# Patient Record
Sex: Male | Born: 1965 | Race: White | Hispanic: No | Marital: Married | Smoking: Former smoker
Health system: Southern US, Community
[De-identification: ages and names within clinical notes are randomized; demographics above are authoritative.]

## PROBLEM LIST (undated history)

## (undated) DIAGNOSIS — I77819 Aortic ectasia, unspecified site: Secondary | ICD-10-CM

## (undated) DIAGNOSIS — I10 Essential (primary) hypertension: Secondary | ICD-10-CM

## (undated) DIAGNOSIS — I701 Atherosclerosis of renal artery: Secondary | ICD-10-CM

## (undated) DIAGNOSIS — F32A Depression, unspecified: Secondary | ICD-10-CM

## (undated) DIAGNOSIS — J45909 Unspecified asthma, uncomplicated: Secondary | ICD-10-CM

## (undated) DIAGNOSIS — G4733 Obstructive sleep apnea (adult) (pediatric): Secondary | ICD-10-CM

## (undated) DIAGNOSIS — N200 Calculus of kidney: Secondary | ICD-10-CM

## (undated) DIAGNOSIS — I77810 Thoracic aortic ectasia: Secondary | ICD-10-CM

## (undated) DIAGNOSIS — F329 Major depressive disorder, single episode, unspecified: Secondary | ICD-10-CM

## (undated) DIAGNOSIS — F40240 Claustrophobia: Secondary | ICD-10-CM

## (undated) HISTORY — PX: TONSILLECTOMY: SUR1361

## (undated) HISTORY — PX: LASIK: SHX215

## (undated) HISTORY — DX: Atherosclerosis of renal artery: I70.1

## (undated) HISTORY — DX: Obstructive sleep apnea (adult) (pediatric): G47.33

## (undated) HISTORY — PX: ANKLE SURGERY: SHX546

## (undated) HISTORY — DX: Aortic ectasia, unspecified site: I77.819

## (undated) HISTORY — DX: Thoracic aortic ectasia: I77.810

---

## 2000-11-12 ENCOUNTER — Encounter: Payer: Self-pay | Admitting: Emergency Medicine

## 2000-11-12 ENCOUNTER — Emergency Department (HOSPITAL_COMMUNITY): Admission: EM | Admit: 2000-11-12 | Discharge: 2000-11-12 | Payer: Self-pay | Admitting: Emergency Medicine

## 2000-11-29 ENCOUNTER — Encounter: Payer: Self-pay | Admitting: Pediatrics

## 2000-11-29 ENCOUNTER — Encounter: Admission: RE | Admit: 2000-11-29 | Discharge: 2000-11-29 | Payer: Self-pay | Admitting: Pediatrics

## 2004-08-08 ENCOUNTER — Encounter: Admission: RE | Admit: 2004-08-08 | Discharge: 2004-08-08 | Payer: Self-pay | Admitting: Allergy and Immunology

## 2005-10-24 ENCOUNTER — Emergency Department (HOSPITAL_COMMUNITY): Admission: EM | Admit: 2005-10-24 | Discharge: 2005-10-25 | Payer: Self-pay | Admitting: Emergency Medicine

## 2005-12-07 ENCOUNTER — Emergency Department (HOSPITAL_COMMUNITY): Admission: EM | Admit: 2005-12-07 | Discharge: 2005-12-07 | Payer: Self-pay | Admitting: Emergency Medicine

## 2012-08-15 ENCOUNTER — Ambulatory Visit: Payer: Self-pay

## 2012-08-15 ENCOUNTER — Other Ambulatory Visit: Payer: Self-pay | Admitting: Occupational Medicine

## 2012-08-15 DIAGNOSIS — R52 Pain, unspecified: Secondary | ICD-10-CM

## 2013-02-07 ENCOUNTER — Other Ambulatory Visit: Payer: Self-pay | Admitting: Neurosurgery

## 2013-02-16 ENCOUNTER — Encounter (HOSPITAL_COMMUNITY): Payer: Self-pay | Admitting: Pharmacy Technician

## 2013-02-23 ENCOUNTER — Encounter (HOSPITAL_COMMUNITY): Payer: Self-pay

## 2013-02-23 ENCOUNTER — Encounter (HOSPITAL_COMMUNITY)
Admission: RE | Admit: 2013-02-23 | Discharge: 2013-02-23 | Disposition: A | Discharge status: Home / Self Care | Payer: Worker's Compensation | Source: Ambulatory Visit | Attending: Neurosurgery | Admitting: Neurosurgery

## 2013-02-23 ENCOUNTER — Ambulatory Visit (HOSPITAL_COMMUNITY)
Admission: RE | Admit: 2013-02-23 | Discharge: 2013-02-23 | Disposition: A | Discharge status: Home / Self Care | Payer: Worker's Compensation | Source: Ambulatory Visit | Attending: Neurosurgery | Admitting: Neurosurgery

## 2013-02-23 DIAGNOSIS — J984 Other disorders of lung: Secondary | ICD-10-CM | POA: Insufficient documentation

## 2013-02-23 DIAGNOSIS — I10 Essential (primary) hypertension: Secondary | ICD-10-CM | POA: Insufficient documentation

## 2013-02-23 DIAGNOSIS — Z01818 Encounter for other preprocedural examination: Secondary | ICD-10-CM | POA: Insufficient documentation

## 2013-02-23 HISTORY — DX: Calculus of kidney: N20.0

## 2013-02-23 HISTORY — DX: Essential (primary) hypertension: I10

## 2013-02-23 HISTORY — DX: Unspecified asthma, uncomplicated: J45.909

## 2013-02-23 LAB — BASIC METABOLIC PANEL
Calcium: 9.3 mg/dL (ref 8.4–10.5)
GFR calc Af Amer: >90 mL/min (ref >90)
GFR calc non Af Amer: 89 mL/min — ABNORMAL LOW (ref >90)
Glucose, Bld: 99 mg/dL (ref 70–99)
Sodium: 138 mEq/L (ref 135–145)

## 2013-02-23 LAB — SURGICAL PCR SCREEN
MRSA, PCR: NEGATIVE
Staphylococcus aureus: NEGATIVE

## 2013-02-23 LAB — CBC
MCH: 28.8 pg (ref 26.0–34.0)
MCHC: 34.7 g/dL (ref 30.0–36.0)
Platelets: 250 10*3/uL (ref 150–400)
RDW: 13.4 % (ref 11.5–15.5)

## 2013-02-23 NOTE — Pre-Procedure Instructions (Signed)
Don [name]  02/23/2013   Your procedure is scheduled on:    Tuesday  02/28/13  Report to [facility] at 730 AM.  Call this number if you have problems the morning of surgery: [phone]   Remember:   Do not eat food or drink liquids after midnight.   Take these medicines the morning of surgery with A SIP OF WATER:  ALBUTEROL, AMLODIPINE(NORVASC), ADVAIR, PAIN PILL (HYDROCODONE OR ULTRAM)   Do not wear jewelry, make-up or nail polish.  Do not wear lotions, powders, or perfumes. You may wear deodorant.  Do not shave 48 hours prior to surgery. Men may shave face and neck.  Do not bring valuables to the hospital.  [facility] is not responsible                   for any belongings or valuables.  Contacts, dentures or bridgework may not be worn into surgery.  Leave suitcase in the car. After surgery it may be brought to your room.  For patients admitted to the hospital, checkout time is 11:00 AM the day of  discharge.   Patients discharged the day of surgery will not be allowed to drive  home.  Name and phone number of your driver:   Special Instructions: Shower using CHG 2 nights before surgery and the night before surgery.  If you shower the day of surgery use CHG.  Use special wash - you have one bottle of CHG for all showers.  You should use approximately 1/3 of the bottle for each shower.   Please read over the following fact sheets that you were given: Pain Booklet, Coughing and Deep Breathing, MRSA Information and Surgical Site Infection Prevention

## 2013-02-23 NOTE — Progress Notes (Signed)
02/23/13 1530  OBSTRUCTIVE SLEEP APNEA  Have you ever been diagnosed with sleep apnea through a sleep study? No  Do you snore loudly (loud enough to be heard through closed doors)?  1  Do you often feel tired, fatigued, or sleepy during the daytime? 0  Has anyone observed you stop breathing during your sleep? 0  Do you have, or are you being treated for high blood pressure? 1  BMI more than 35 kg/m2? 1  Age over 46 years old? 0  Neck circumference greater than 40 cm/18 inches? 1  Gender: 1  Obstructive Sleep Apnea Score 5  Score 4 or greater  Results sent to PCP

## 2013-02-23 NOTE — Progress Notes (Signed)
PER SUSAN AT DR. STERN'S OFFICE ,PATIENT SHOULD ARRIVE AT 730 AM .

## 2013-02-27 MED ORDER — DEXTROSE 5 % IV SOLN
3.0000 g | INTRAVENOUS | Status: AC
  Administered 2013-02-28: 3 g via INTRAVENOUS
  Filled 2013-02-27: qty 3000

## 2013-02-27 NOTE — H&P (Signed)
NEUROSURGICAL CONSULTATION   CHIEF COMPLAINT:                                          Low back and right leg pain.   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  He describes right hip and leg pain.  In general, he states the pain is about 7-8/10, at its worst it is about 10/10.  He notes numbness and tingling in his right leg and foot, weakness in his right leg.  He describes that this was initially the result of an injury  while he was raking and using a leaf machine on 08/11/2012.  He said he felt a pull in his back and developed pain in his leg and went to the doctor a couple of days later because it was not getting any better.  He said he did some physical therapy until the first week of January and actually got better and he returned to work.  Then on 12/29/2012, while using a chain-saw and backhoe,  he developed recurrence of pain and he has been out of work since April.  He has been seen by Dr. Alto Denver, who advised him to get an MRI of his lumbar spine.    His lumbar MRI demonstrates that he has a herniated disc at L4-L5 on the right which is causing right L5 nerve root compression.     I have reviewed lumbar MRI, which shows that he has a significant disc herniation at L4-L5 on the right.  There is a small disc protrusion at L4-L5 on the left which does not appear to be causing any nerve root compression.  I also reviewed plain radiographs of the lumbar spine, which do not demonstrate significant spondylolisthesis in the lumbar spine.  AP view demonstrates five low-lying lumbar vertebrae.   His MRI scan was performed by [facility] on 01/05/2013, which demonstrates a focal right posterolateral disc protrusion at L4-L5 compressing the right lateral aspect of the thecal sac and right L5 nerve root.  Incidental note was made of a large left renal cyst  which was incompletely imaged, but per radiologist did not appear to be suggestive of pathologic lesion.     REVIEW OF SYSTEMS:                                    Detailed review of systems sheet was reviewed with the patient.  Under cardiovascular he notes high blood pressure.  Under respiratory he notes asthma.  Under musculoskeletal, he notes leg weakness, back pain, leg pain.    PAST MEDICAL HISTORY:                                           Current Medical Conditions:   He has history of hypertension and asthma, fracture of his right leg and ankle in 03/1986.            Medications and Allergies:  He has been taking Flexeril 10 mg  q.h.s., tramadol 50 mg two p.o. q.h.s. and p.r.n. bedtime, and diclofenac 75 mg twice daily for pain. His current medications include hydrochlorothiazide 25 mg per day, amlodipine 25 mg daily, Advair 100/50 mcg two puffs per day, cyclobenzaprine 10 mg as needed.    SOCIAL HISTORY:                                            He denies tobacco or drug use.  He is a social drinker of alcoholic beverages.   PHYSICAL EXAMINATION:                                He is currently 5 feet 9 inches tall, 298 pounds with a BMI of 43.  On examination today, Mr. President is pleasant cooperative man in no acute distress.  Blood pressure is 160/90, heart rate 76 and regular.  Respiratory rate 16.            HEENT - Normocephalic, atraumatic.            Respiratory - Chest clear to auscultation without rales or rhonchi.            Cardiovascular - Heart regular rate and rhythm without murmur.            Abdomen - Soft, nontender.  Active bowel sounds.  No hepatosplenomegaly appreciated.             Extremities - Without edema or cyanosis.          Musculoskeletal Examination -He cannot stand on his right foot.  He walks with an antalgic gait favoring his right lower extremity.   He has positive right sciatic notch discomfort to palpation.  He is able to bend to knees with  his upper extremities outstretched.  He is able to stand on his heels and toes.  He has a positive straight leg raise at 30 degrees on the right.   NEUROLOGICAL EXAMINATION:           The patient is awake, alert, fully oriented.  Speaks with clear and fluent speech.  Has intact short and long term memory.                Cranial Nerve Examination - Pupils are equal, round, and reactive to light.  Extraocular movements are intact.  Facial sensation and facial motor intact and symmetric.  Hearing is intact to finger rub.  Palate is upgoing.  Shoulder shrug is symmetric.  Tongue protrudes in the midline.            Motor Examination - Upper extremity strength is full in all motor groups, bilaterally symmetric.   Lower extremity strength is full in all motor groups, bilaterally symmetric with exception of right extensor hallucis longus of 4-/5 and right hip abductor 4/5.             Sensory Examination - Sensory examination is normal to pinprick in his lower extremities.             Deep Tendon Reflexes - Deep tendon reflexes are 2 in the biceps, triceps, and brachioradialis, 2 in the knees, 2 in the ankles.  The great toes are downgoing to plantar stimulation.    Cerebellar Examination - Normal.    IMPRESSION/RECOMMENDATONS:     Don [name] is a  47 year old man with a focal disc herniation at L4-L5 on the right.  He has a right L5 radiculopathy.  He has weakness, positive straight leg raise, significant pain, unable to get relief, and has been out of work because of this injury.   At this point I would recommend to Mr. Rumery that he undergo lumbar microdiscectomy at the L4-L5 level on the right to remove the fragment of herniated disc at the L4-L5 level on the right.  He wishes to proceed.  We will need to get clearance from Worker's Comp to proceed.  Risks and benefits were discussed in detail with the patient.  He says he wants to continue with his current pain medication, but I will give him  prescription for hydrocodone for more severe pain.  We will await authorization before proceeding with surgery.  We will then plan on right L4-L5 microdiscectomy.       __________________________________ Danae Orleans Venetia Maxon, MD

## 2013-02-28 ENCOUNTER — Ambulatory Visit (HOSPITAL_COMMUNITY)
Admission: RE | Admit: 2013-02-28 | Discharge: 2013-03-01 | Disposition: A | Discharge status: Home / Self Care | Payer: Worker's Compensation | Source: Ambulatory Visit | Attending: Neurosurgery | Admitting: Neurosurgery

## 2013-02-28 ENCOUNTER — Ambulatory Visit (HOSPITAL_COMMUNITY): Payer: Worker's Compensation

## 2013-02-28 ENCOUNTER — Encounter (HOSPITAL_COMMUNITY): Payer: Self-pay | Admitting: *Deleted

## 2013-02-28 ENCOUNTER — Encounter (HOSPITAL_COMMUNITY): Payer: Self-pay | Admitting: Anesthesiology

## 2013-02-28 ENCOUNTER — Encounter (HOSPITAL_COMMUNITY)
Admission: RE | Disposition: A | Discharge status: Home / Self Care | Payer: Self-pay | Source: Ambulatory Visit | Attending: Neurosurgery

## 2013-02-28 ENCOUNTER — Ambulatory Visit (HOSPITAL_COMMUNITY): Payer: Worker's Compensation | Admitting: Anesthesiology

## 2013-02-28 DIAGNOSIS — Z79899 Other long term (current) drug therapy: Secondary | ICD-10-CM | POA: Insufficient documentation

## 2013-02-28 DIAGNOSIS — M47817 Spondylosis without myelopathy or radiculopathy, lumbosacral region: Secondary | ICD-10-CM | POA: Insufficient documentation

## 2013-02-28 DIAGNOSIS — M5126 Other intervertebral disc displacement, lumbar region: Secondary | ICD-10-CM | POA: Insufficient documentation

## 2013-02-28 DIAGNOSIS — I10 Essential (primary) hypertension: Secondary | ICD-10-CM | POA: Insufficient documentation

## 2013-02-28 HISTORY — PX: LUMBAR LAMINECTOMY/DECOMPRESSION MICRODISCECTOMY: SHX5026

## 2013-02-28 SURGERY — LUMBAR LAMINECTOMY/DECOMPRESSION MICRODISCECTOMY 1 LEVEL
Anesthesia: General | Laterality: Right | Wound class: Clean

## 2013-02-28 MED ORDER — LIDOCAINE HCL 4 % MT SOLN
OROMUCOSAL | Status: DC | PRN
  Administered 2013-02-28: 4 mL via TOPICAL

## 2013-02-28 MED ORDER — DOCUSATE SODIUM 100 MG PO CAPS
100.0000 mg | ORAL_CAPSULE | Freq: Two times a day (BID) | ORAL | Status: DC
  Administered 2013-02-28 (×2): 100 mg via ORAL
  Filled 2013-02-28 (×2): qty 1

## 2013-02-28 MED ORDER — MOMETASONE FURO-FORMOTEROL FUM 100-5 MCG/ACT IN AERO
2.0000 | INHALATION_SPRAY | Freq: Two times a day (BID) | RESPIRATORY_TRACT | Status: DC
  Administered 2013-02-28: 2 via RESPIRATORY_TRACT
  Filled 2013-02-28 (×2): qty 8.8

## 2013-02-28 MED ORDER — 0.9 % SODIUM CHLORIDE (POUR BTL) OPTIME
TOPICAL | Status: DC | PRN
  Administered 2013-02-28: 1000 mL

## 2013-02-28 MED ORDER — OXYCODONE HCL 5 MG/5ML PO SOLN: 5.0000 mg | Freq: Once | ORAL | Status: DC | PRN

## 2013-02-28 MED ORDER — ARTIFICIAL TEARS OP OINT
TOPICAL_OINTMENT | OPHTHALMIC | Status: DC | PRN
  Administered 2013-02-28: 1 via OPHTHALMIC

## 2013-02-28 MED ORDER — BISACODYL 10 MG RE SUPP: 10.0000 mg | Freq: Every day | RECTAL | Status: DC | PRN

## 2013-02-28 MED ORDER — FENTANYL CITRATE 0.05 MG/ML IJ SOLN
INTRAMUSCULAR | Status: DC | PRN
  Administered 2013-02-28 (×2): 100 ug via INTRAVENOUS

## 2013-02-28 MED ORDER — NEOSTIGMINE METHYLSULFATE 1 MG/ML IJ SOLN
INTRAMUSCULAR | Status: DC | PRN
  Administered 2013-02-28: 3 mg via INTRAVENOUS

## 2013-02-28 MED ORDER — ALBUTEROL SULFATE HFA 108 (90 BASE) MCG/ACT IN AERS
2.0000 | INHALATION_SPRAY | Freq: Four times a day (QID) | RESPIRATORY_TRACT | Status: DC | PRN
  Filled 2013-02-28: qty 6.7

## 2013-02-28 MED ORDER — MIDAZOLAM HCL 5 MG/5ML IJ SOLN
INTRAMUSCULAR | Status: DC | PRN
  Administered 2013-02-28: 2 mg via INTRAVENOUS

## 2013-02-28 MED ORDER — FENTANYL CITRATE 0.05 MG/ML IJ SOLN
INTRAMUSCULAR | Status: DC | PRN
  Administered 2013-02-28: 100 ug via INTRAVENOUS

## 2013-02-28 MED ORDER — ACETAMINOPHEN 650 MG RE SUPP: 650.0000 mg | RECTAL | Status: DC | PRN

## 2013-02-28 MED ORDER — GLYCOPYRROLATE 0.2 MG/ML IJ SOLN
INTRAMUSCULAR | Status: DC | PRN
  Administered 2013-02-28: 0.4 mg via INTRAVENOUS

## 2013-02-28 MED ORDER — ONDANSETRON HCL 4 MG/2ML IJ SOLN: 4.0000 mg | Freq: Four times a day (QID) | INTRAMUSCULAR | Status: DC | PRN

## 2013-02-28 MED ORDER — MIDAZOLAM HCL 2 MG/2ML IJ SOLN: 1.0000 mg | INTRAMUSCULAR | Status: DC | PRN

## 2013-02-28 MED ORDER — MIDAZOLAM HCL 2 MG/2ML IJ SOLN
INTRAMUSCULAR | Status: AC
  Filled 2013-02-28: qty 2

## 2013-02-28 MED ORDER — PHENOL 1.4 % MT LIQD: 1.0000 | OROMUCOSAL | Status: DC | PRN

## 2013-02-28 MED ORDER — ALUM & MAG HYDROXIDE-SIMETH 200-200-20 MG/5ML PO SUSP: 30.0000 mL | Freq: Four times a day (QID) | ORAL | Status: DC | PRN

## 2013-02-28 MED ORDER — HYDROCHLOROTHIAZIDE 25 MG PO TABS
25.0000 mg | ORAL_TABLET | Freq: Every day | ORAL | Status: DC
  Administered 2013-02-28: 25 mg via ORAL
  Filled 2013-02-28 (×2): qty 1

## 2013-02-28 MED ORDER — ACETAMINOPHEN 325 MG PO TABS: 650.0000 mg | ORAL_TABLET | ORAL | Status: DC | PRN

## 2013-02-28 MED ORDER — FENTANYL CITRATE 0.05 MG/ML IJ SOLN
INTRAMUSCULAR | Status: AC
  Filled 2013-02-28: qty 2

## 2013-02-28 MED ORDER — DICLOFENAC SODIUM 75 MG PO TBEC
75.0000 mg | DELAYED_RELEASE_TABLET | Freq: Two times a day (BID) | ORAL | Status: DC
  Administered 2013-02-28 (×2): 75 mg via ORAL
  Filled 2013-02-28 (×4): qty 1

## 2013-02-28 MED ORDER — POLYETHYLENE GLYCOL 3350 17 G PO PACK
17.0000 g | PACK | Freq: Every day | ORAL | Status: DC | PRN
  Filled 2013-02-28: qty 1

## 2013-02-28 MED ORDER — TRAMADOL HCL 50 MG PO TABS
50.0000 mg | ORAL_TABLET | Freq: Four times a day (QID) | ORAL | Status: DC | PRN
  Filled 2013-02-28: qty 1

## 2013-02-28 MED ORDER — ZOLPIDEM TARTRATE 5 MG PO TABS: 5.0000 mg | ORAL_TABLET | Freq: Every evening | ORAL | Status: DC | PRN

## 2013-02-28 MED ORDER — SODIUM CHLORIDE 0.9 % IJ SOLN: 3.0000 mL | INTRAMUSCULAR | Status: DC | PRN

## 2013-02-28 MED ORDER — ROCURONIUM BROMIDE 100 MG/10ML IV SOLN
INTRAVENOUS | Status: DC | PRN
  Administered 2013-02-28: 50 mg via INTRAVENOUS

## 2013-02-28 MED ORDER — CEFAZOLIN SODIUM 1-5 GM-% IV SOLN
1.0000 g | Freq: Three times a day (TID) | INTRAVENOUS | Status: AC
  Administered 2013-02-28 (×2): 1 g via INTRAVENOUS
  Filled 2013-02-28 (×2): qty 50

## 2013-02-28 MED ORDER — MENTHOL 3 MG MT LOZG: 1.0000 | LOZENGE | OROMUCOSAL | Status: DC | PRN

## 2013-02-28 MED ORDER — METHYLPREDNISOLONE ACETATE 80 MG/ML IJ SUSP
INTRAMUSCULAR | Status: DC | PRN
  Administered 2013-02-28: 80 mg

## 2013-02-28 MED ORDER — HEMOSTATIC AGENTS (NO CHARGE) OPTIME
TOPICAL | Status: DC | PRN
  Administered 2013-02-28: 1 via TOPICAL

## 2013-02-28 MED ORDER — PANTOPRAZOLE SODIUM 40 MG IV SOLR
40.0000 mg | Freq: Every day | INTRAVENOUS | Status: DC
  Administered 2013-02-28: 40 mg via INTRAVENOUS
  Filled 2013-02-28 (×2): qty 40

## 2013-02-28 MED ORDER — CYCLOBENZAPRINE HCL 10 MG PO TABS
10.0000 mg | ORAL_TABLET | Freq: Three times a day (TID) | ORAL | Status: DC | PRN
  Administered 2013-03-01: 10 mg via ORAL
  Filled 2013-02-28: qty 1

## 2013-02-28 MED ORDER — SENNA 8.6 MG PO TABS
1.0000 | ORAL_TABLET | Freq: Two times a day (BID) | ORAL | Status: DC
  Administered 2013-02-28 (×2): 8.6 mg via ORAL
  Filled 2013-02-28 (×4): qty 1

## 2013-02-28 MED ORDER — PROPOFOL 10 MG/ML IV BOLUS
INTRAVENOUS | Status: DC | PRN
  Administered 2013-02-28: 200 mg via INTRAVENOUS

## 2013-02-28 MED ORDER — OXYCODONE-ACETAMINOPHEN 5-325 MG PO TABS
1.0000 | ORAL_TABLET | ORAL | Status: DC | PRN
  Administered 2013-02-28 – 2013-03-01 (×2): 2 via ORAL
  Filled 2013-02-28 (×2): qty 2

## 2013-02-28 MED ORDER — SODIUM CHLORIDE 0.9 % IJ SOLN
3.0000 mL | Freq: Two times a day (BID) | INTRAMUSCULAR | Status: DC
  Administered 2013-02-28: 3 mL via INTRAVENOUS

## 2013-02-28 MED ORDER — OXYCODONE HCL 5 MG PO TABS: 5.0000 mg | ORAL_TABLET | Freq: Once | ORAL | Status: DC | PRN

## 2013-02-28 MED ORDER — AMLODIPINE BESYLATE 2.5 MG PO TABS
2.5000 mg | ORAL_TABLET | Freq: Every day | ORAL | Status: DC
Start: 2013-02-28 — End: 2013-03-01
  Filled 2013-02-28 (×2): qty 1

## 2013-02-28 MED ORDER — FLEET ENEMA 7-19 GM/118ML RE ENEM
1.0000 | ENEMA | Freq: Once | RECTAL | Status: AC | PRN
  Filled 2013-02-28: qty 1

## 2013-02-28 MED ORDER — LIDOCAINE HCL (CARDIAC) 20 MG/ML IV SOLN
INTRAVENOUS | Status: DC | PRN
  Administered 2013-02-28: 80 mg via INTRAVENOUS

## 2013-02-28 MED ORDER — LABETALOL HCL 5 MG/ML IV SOLN
INTRAVENOUS | Status: DC | PRN
  Administered 2013-02-28 (×2): 5 mg via INTRAVENOUS

## 2013-02-28 MED ORDER — MORPHINE SULFATE 2 MG/ML IJ SOLN: 1.0000 mg | INTRAMUSCULAR | Status: DC | PRN

## 2013-02-28 MED ORDER — HYDROCODONE-ACETAMINOPHEN 5-325 MG PO TABS
1.0000 | ORAL_TABLET | ORAL | Status: DC | PRN
Start: 2013-02-28 — End: 2013-03-01

## 2013-02-28 MED ORDER — LACTATED RINGERS IV SOLN
INTRAVENOUS | Status: DC | PRN
  Administered 2013-02-28 (×2): via INTRAVENOUS

## 2013-02-28 MED ORDER — HYDROMORPHONE HCL PF 1 MG/ML IJ SOLN
0.2500 mg | INTRAMUSCULAR | Status: DC | PRN
  Administered 2013-02-28 (×2): 0.5 mg via INTRAVENOUS

## 2013-02-28 MED ORDER — KCL IN DEXTROSE-NACL 20-5-0.45 MEQ/L-%-% IV SOLN
INTRAVENOUS | Status: DC
  Filled 2013-02-28 (×4): qty 1000

## 2013-02-28 MED ORDER — THROMBIN 5000 UNITS EX SOLR
CUTANEOUS | Status: DC | PRN
  Administered 2013-02-28 (×2): 5000 [IU] via TOPICAL

## 2013-02-28 MED ORDER — HYDROMORPHONE HCL PF 1 MG/ML IJ SOLN
INTRAMUSCULAR | Status: AC
  Filled 2013-02-28: qty 1

## 2013-02-28 MED ORDER — BUPIVACAINE HCL (PF) 0.5 % IJ SOLN
INTRAMUSCULAR | Status: DC | PRN
  Administered 2013-02-28: 5 mL

## 2013-02-28 MED ORDER — ONDANSETRON HCL 4 MG/2ML IJ SOLN: 4.0000 mg | INTRAMUSCULAR | Status: DC | PRN

## 2013-02-28 MED ORDER — LIDOCAINE-EPINEPHRINE 1 %-1:100000 IJ SOLN
INTRAMUSCULAR | Status: DC | PRN
  Administered 2013-02-28: 5 mL via INTRADERMAL

## 2013-02-28 MED ORDER — SODIUM CHLORIDE 0.9 % IV SOLN: 250.0000 mL | INTRAVENOUS | Status: DC

## 2013-02-28 MED ORDER — DEXTROSE 5 % IV SOLN
10.0000 mg | INTRAVENOUS | Status: DC | PRN
  Administered 2013-02-28: 50 ug/min via INTRAVENOUS

## 2013-02-28 MED ORDER — ONDANSETRON HCL 4 MG/2ML IJ SOLN
INTRAMUSCULAR | Status: DC | PRN
  Administered 2013-02-28: 4 mg via INTRAVENOUS

## 2013-02-28 MED ORDER — DIAZEPAM 5 MG PO TABS: 5.0000 mg | ORAL_TABLET | Freq: Four times a day (QID) | ORAL | Status: DC | PRN

## 2013-02-28 MED ORDER — HYDROCODONE-ACETAMINOPHEN 5-325 MG PO TABS: 1.0000 | ORAL_TABLET | Freq: Four times a day (QID) | ORAL | Status: DC | PRN

## 2013-02-28 SURGICAL SUPPLY — 58 items
ADH SKN CLS APL DERMABOND .7 (GAUZE/BANDAGES/DRESSINGS) ×1
APL SKNCLS STERI-STRIP NONHPOA (GAUZE/BANDAGES/DRESSINGS)
BAG DECANTER FOR FLEXI CONT (MISCELLANEOUS) ×2 IMPLANT
BENZOIN TINCTURE PRP APPL 2/3 (GAUZE/BANDAGES/DRESSINGS) IMPLANT
BIT DRILL NEURO 2X3.1 SFT TUCH (MISCELLANEOUS) ×1 IMPLANT
BLADE SURG ROTATE 9660 (MISCELLANEOUS) IMPLANT
BUR ROUND FLUTED 5 RND (BURR) ×2 IMPLANT
CANISTER SUCTION 2500CC (MISCELLANEOUS) ×2 IMPLANT
CLOTH BEACON ORANGE TIMEOUT ST (SAFETY) ×2 IMPLANT
CONT SPEC 4OZ CLIKSEAL STRL BL (MISCELLANEOUS) ×2 IMPLANT
DERMABOND ADVANCED (GAUZE/BANDAGES/DRESSINGS) ×1
DERMABOND ADVANCED .7 DNX12 (GAUZE/BANDAGES/DRESSINGS) ×1 IMPLANT
DRAPE LAPAROTOMY 100X72X124 (DRAPES) ×2 IMPLANT
DRAPE MICROSCOPE LEICA (MISCELLANEOUS) ×2 IMPLANT
DRAPE POUCH INSTRU U-SHP 10X18 (DRAPES) ×2 IMPLANT
DRAPE SURG 17X23 STRL (DRAPES) ×2 IMPLANT
DRESSING TELFA 8X3 (GAUZE/BANDAGES/DRESSINGS) IMPLANT
DRILL NEURO 2X3.1 SOFT TOUCH (MISCELLANEOUS) ×2
DURAPREP 26ML APPLICATOR (WOUND CARE) ×2 IMPLANT
ELECT REM PT RETURN 9FT ADLT (ELECTROSURGICAL) ×2
ELECTRODE REM PT RTRN 9FT ADLT (ELECTROSURGICAL) ×1 IMPLANT
GAUZE SPONGE 4X4 16PLY XRAY LF (GAUZE/BANDAGES/DRESSINGS) IMPLANT
GLOVE BIO SURGEON STRL SZ8 (GLOVE) ×2 IMPLANT
GLOVE BIOGEL PI IND STRL 8 (GLOVE) ×1 IMPLANT
GLOVE BIOGEL PI IND STRL 8.5 (GLOVE) ×1 IMPLANT
GLOVE BIOGEL PI INDICATOR 8 (GLOVE) ×1
GLOVE BIOGEL PI INDICATOR 8.5 (GLOVE) ×1
GLOVE ECLIPSE 7.5 STRL STRAW (GLOVE) ×2 IMPLANT
GLOVE ECLIPSE 8.0 STRL XLNG CF (GLOVE) ×2 IMPLANT
GLOVE EXAM NITRILE LRG STRL (GLOVE) IMPLANT
GLOVE EXAM NITRILE MD LF STRL (GLOVE) ×1 IMPLANT
GLOVE EXAM NITRILE XL STR (GLOVE) IMPLANT
GLOVE EXAM NITRILE XS STR PU (GLOVE) IMPLANT
GOWN BRE IMP SLV AUR LG STRL (GOWN DISPOSABLE) IMPLANT
GOWN BRE IMP SLV AUR XL STRL (GOWN DISPOSABLE) ×4 IMPLANT
GOWN STRL REIN 2XL LVL4 (GOWN DISPOSABLE) ×2 IMPLANT
KIT BASIN OR (CUSTOM PROCEDURE TRAY) ×2 IMPLANT
KIT ROOM TURNOVER OR (KITS) ×2 IMPLANT
NDL HYPO 18GX1.5 BLUNT FILL (NEEDLE) IMPLANT
NDL HYPO 25X1 1.5 SAFETY (NEEDLE) ×1 IMPLANT
NEEDLE HYPO 18GX1.5 BLUNT FILL (NEEDLE) ×2 IMPLANT
NEEDLE HYPO 25X1 1.5 SAFETY (NEEDLE) ×2 IMPLANT
NS IRRIG 1000ML POUR BTL (IV SOLUTION) ×2 IMPLANT
PACK LAMINECTOMY NEURO (CUSTOM PROCEDURE TRAY) ×2 IMPLANT
PAD ARMBOARD 7.5X6 YLW CONV (MISCELLANEOUS) ×6 IMPLANT
RUBBERBAND STERILE (MISCELLANEOUS) ×4 IMPLANT
SPONGE GAUZE 4X4 12PLY (GAUZE/BANDAGES/DRESSINGS) IMPLANT
SPONGE SURGIFOAM ABS GEL SZ50 (HEMOSTASIS) ×2 IMPLANT
STRIP CLOSURE SKIN 1/2X4 (GAUZE/BANDAGES/DRESSINGS) IMPLANT
SUT VIC AB 0 CT1 18XCR BRD8 (SUTURE) ×1 IMPLANT
SUT VIC AB 0 CT1 8-18 (SUTURE) ×2
SUT VIC AB 2-0 CT1 18 (SUTURE) ×2 IMPLANT
SUT VIC AB 3-0 SH 8-18 (SUTURE) ×2 IMPLANT
SYR 20ML ECCENTRIC (SYRINGE) ×2 IMPLANT
SYR 5ML LL (SYRINGE) ×1 IMPLANT
TOWEL OR 17X24 6PK STRL BLUE (TOWEL DISPOSABLE) ×2 IMPLANT
TOWEL OR 17X26 10 PK STRL BLUE (TOWEL DISPOSABLE) ×2 IMPLANT
WATER STERILE IRR 1000ML POUR (IV SOLUTION) ×2 IMPLANT

## 2013-02-28 NOTE — Transfer of Care (Signed)
Immediate Anesthesia Transfer of Care Note  Patient: Don [name]  Procedure(s) Performed: Procedure(s) with comments: LUMBAR LAMINECTOMY/DECOMPRESSION MICRODISCECTOMY 1 LEVEL (Right) - Right Lumbar Four-Five Laminectomy  Patient Location: PACU  Anesthesia Type:General  Level of Consciousness: awake, alert  and oriented  Airway & Oxygen Therapy: Patient Spontanous Breathing and Patient connected to nasal cannula oxygen  Post-op Assessment: Report given to PACU RN and Post -op Vital signs reviewed and stable  Post vital signs: Reviewed and stable  Complications: No apparent anesthesia complications

## 2013-02-28 NOTE — Anesthesia Procedure Notes (Signed)
Procedure Name: Intubation Date/Time: 02/28/2013 10:04 AM Performed by: Tyrone Nine Pre-anesthesia Checklist: Patient identified, Timeout performed, Emergency Drugs available, Suction available and Patient being monitored Patient Re-evaluated:Patient Re-evaluated prior to inductionOxygen Delivery Method: Circle system utilized Preoxygenation: Pre-oxygenation with 100% oxygen Intubation Type: IV induction Ventilation: Two handed mask ventilation required and Oral airway inserted - appropriate to patient size Laryngoscope Size: Mac and 3 Grade View: Grade I Tube size: 8.0 mm Number of attempts: 1 Airway Equipment and Method: Stylet Placement Confirmation: ETT inserted through vocal cords under direct vision,  positive ETCO2 and breath sounds checked- equal and bilateral Secured at: 23 cm Tube secured with: Tape Dental Injury: Teeth and Oropharynx as per pre-operative assessment

## 2013-02-28 NOTE — Progress Notes (Signed)
Subjective: Patient reports "I don't have any thigh or groin pain"  Objective: Vital signs in last 24 hours: Temp:  [97.3 F (36.3 C)-98.9 F (37.2 C)] 98.9 F (37.2 C) (06/03 1636) Pulse Rate:  [73-88] 88 (06/03 1636) Resp:  [11-25] 18 (06/03 1636) BP: (116-156)/(82-96) 142/89 mmHg (06/03 1636) SpO2:  [92 %-96 %] 92 % (06/03 1636)  Intake/Output from previous day:   Intake/Output this shift: Total I/O In: 1780 [P.O.:480; I.V.:1300] Out: 150 [Blood:150]  Alert, ambulating with wife & nurse. Mild lumbar pain controlled by po meds. Incision with Dermabond, no erythema, swelling, or drainage. Good strength BLE.  Lab Results: No results found for this basename: WBC, HGB, HCT, PLT,  in the last 72 hours BMET No results found for this basename: NA, K, CL, CO2, GLUCOSE, BUN, CREATININE, CALCIUM,  in the last 72 hours  Studies/Results: Dg Lumbar Spine 1 View  02/28/2013   *RADIOLOGY REPORT*  Clinical Data: Back pain  LUMBAR SPINE - 1 VIEW  Comparison: MRI 01/05/2013.  Findings: Cross-table lateral film #1 at 1035 hours demonstrates a probe at L4-L5.  IMPRESSION: L4-L5 marked.   Original Report Authenticated By: Davonna Belling, M.D.    Assessment/Plan: Improved   LOS: 0 days  Per Dr. Venetia Maxon, ok to d/c to home when comfortable from pain standpoint & ambulating without difficulty.  Pt has Hydrocodone & Flexeril at home for PRN use. Pt knows to call office to schedule 3-4 wk f/u appt. Pt verbalizes understanding of d/c instructions.   Georgiann Cocker 02/28/2013, 5:15 PM

## 2013-02-28 NOTE — Anesthesia Postprocedure Evaluation (Signed)
Anesthesia Post Note  Patient: Don [name]  Procedure(s) Performed: Procedure(s) (LRB): LUMBAR LAMINECTOMY/DECOMPRESSION MICRODISCECTOMY 1 LEVEL (Right)  Anesthesia type: General  Patient location: PACU  Post pain: Pain level controlled and Adequate analgesia  Post assessment: Post-op Vital signs reviewed, Patient's Cardiovascular Status Stable, Respiratory Function Stable, Patent Airway and Pain level controlled  Last Vitals:  Filed Vitals:   02/28/13 1236  BP:   Pulse: 73  Temp:   Resp: 11    Post vital signs: Reviewed and stable  Level of consciousness: awake, alert  and oriented  Complications: No apparent anesthesia complications

## 2013-02-28 NOTE — Interval H&P Note (Signed)
History and Physical Interval Note:  02/28/2013 6:24 AM  [name]  has presented today for surgery, with the diagnosis of Lumbar hnp with out myelopathy  The various methods of treatment have been discussed with the patient and family. After consideration of risks, benefits and other options for treatment, the patient has consented to  Procedure(s) with comments: LUMBAR LAMINECTOMY/DECOMPRESSION MICRODISCECTOMY 1 LEVEL (Right) - Right L4-5 Laminectomy as a surgical intervention .  The patient's history has been reviewed, patient examined, no change in status, stable for surgery.  I have reviewed the patient's chart and labs.  Questions were answered to the patient's satisfaction.     Kirandeep Fariss D

## 2013-02-28 NOTE — Preoperative (Signed)
Beta Blockers   Reason not to administer Beta Blockers:Not Applicable 

## 2013-02-28 NOTE — Op Note (Signed)
02/28/2013  1:26 PM  PATIENT:  Don [name]  47 y.o. 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, lumbar spondylosis, lumbar stenosis, lumbar radiculopathy L 45 Right  PROCEDURE:  Procedure(s) with comments: LUMBAR LAMINECTOMY/DECOMPRESSION MICRODISCECTOMY 1 LEVEL (Right) - Right Lumbar Four-Five Laminectomy with microdissection  SURGEON:  Surgeon(s) and Role:    * Maeola Harman, MD - Primary    * Reinaldo Meeker, MD - Assisting  PHYSICIAN ASSISTANT:   ASSISTANTS: Poteat, RN   ANESTHESIA:   general  EBL:  Total I/O In: 1300 [I.V.:1300] Out: 150 [Blood:150]  BLOOD ADMINISTERED:none  DRAINS: none   LOCAL MEDICATIONS USED:  MARCAINE     SPECIMEN:  No Specimen  DISPOSITION OF SPECIMEN:  N/A  COUNTS:  YES  TOURNIQUET:  * No tourniquets in log *  DICTATION: Patient has aL4-5 disc rupture on the right with significant right leg weakness. It was elected to take him to surgery for right L4-5 microdiscectomy.  Procedure: Patient was brought to the operating room and following the smooth and uncomplicated induction of general endotracheal anesthesia he was placed in a prone position on the Wilson frame. Low back was prepped and draped in the usual sterile fashion with DuraPrep. Area of planned incision was infiltrated with local lidocaine. Incision was made in the midline and carried to the lumbodorsal fascia which was incised on the right side of midline. Subperiosteal dissection was performed exposing what was felt to be L45 level. Intraoperative x-ray demonstrated marker probe at L4-5. A hemi-semi-laminectomy of L4 was performed a high-speed drill and completed with Kerrison rongeurs and a generous foraminotomy was performed overlying the superior aspect of the L5 lamina. Ligamentum flavum was  detached and removed in a piecemeal fashion and the L5 nerve root was decompressed laterally with removal of the superior aspect of the facet and ligamentum causing nerve root compression. The microscope was brought into the field and the L5 nerve root was mobilized medially. This exposed a large amount of both hard and soft disc material and multiple free fragment of herniated disc material. Multiple fragments were removed and these extended into the interspace which appeared to be quite soft with a disrupted annulus overlying the interspace. As a result it was elected to further decompress the interspace and remove loose disc material and this was done with a variety Epstein curettes and pituitary rongeurs. The redundant annulus was also removed with 2 mm Kerrison rongeur. There was evidence on the MRI of cephalad fragments and these were carefully removed and the L4 nerve root was also decompressed. At this point it was felt that all neural elements were well decompressed and there was no evidence of residual loose disc material within the interspace. The interspace was then irrigated with bacitracin saline and no additional disc material was mobilized. Hemostasis was assured with bipolar electrocautery and the interspace was irrigated with Depo-Medrol and fentanyl. The lumbodorsal fascia was closed with 0 Vicryl sutures the subcutaneous tissues reapproximated 2-0 Vicryl inverted sutures and the skin edges were reapproximated with 3-0 Vicryl subcuticular stitch. The wound is dressed with Dermabond. Patient was extubated in the operating room and taken to recovery in stable and satisfactory condition having tolerated his operation well counts were correct at the end of the case.  PLAN OF CARE: Admit for overnight observation  PATIENT DISPOSITION:  PACU - hemodynamically stable.  Delay start of Pharmacological VTE agent (>24hrs) due to surgical blood loss or risk of bleeding: yes

## 2013-02-28 NOTE — Brief Op Note (Signed)
02/28/2013  1:26 PM  PATIENT:  Don [name]  46 y.o. 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, lumbar spondylosis, lumbar stenosis, lumbar radiculopathy L 45 Right  PROCEDURE:  Procedure(s) with comments: LUMBAR LAMINECTOMY/DECOMPRESSION MICRODISCECTOMY 1 LEVEL (Right) - Right Lumbar Four-Five Laminectomy with microdissection  SURGEON:  Surgeon(s) and Role:    * Kimesha Claxton, MD - Primary    * Randy O Kritzer, MD - Assisting  PHYSICIAN ASSISTANT:   ASSISTANTS: Poteat, RN   ANESTHESIA:   general  EBL:  Total I/O In: 1300 [I.V.:1300] Out: 150 [Blood:150]  BLOOD ADMINISTERED:none  DRAINS: none   LOCAL MEDICATIONS USED:  MARCAINE     SPECIMEN:  No Specimen  DISPOSITION OF SPECIMEN:  N/A  COUNTS:  YES  TOURNIQUET:  * No tourniquets in log *  DICTATION: Patient has aL4-5 disc rupture on the right with significant right leg weakness. It was elected to take him to surgery for right L4-5 microdiscectomy.  Procedure: Patient was brought to the operating room and following the smooth and uncomplicated induction of general endotracheal anesthesia he was placed in a prone position on the Wilson frame. Low back was prepped and draped in the usual sterile fashion with DuraPrep. Area of planned incision was infiltrated with local lidocaine. Incision was made in the midline and carried to the lumbodorsal fascia which was incised on the right side of midline. Subperiosteal dissection was performed exposing what was felt to be L45 level. Intraoperative x-ray demonstrated marker probe at L4-5. A hemi-semi-laminectomy of L4 was performed a high-speed drill and completed with Kerrison rongeurs and a generous foraminotomy was performed overlying the superior aspect of the L5 lamina. Ligamentum flavum was  detached and removed in a piecemeal fashion and the L5 nerve root was decompressed laterally with removal of the superior aspect of the facet and ligamentum causing nerve root compression. The microscope was brought into the field and the L5 nerve root was mobilized medially. This exposed a large amount of both hard and soft disc material and multiple free fragment of herniated disc material. Multiple fragments were removed and these extended into the interspace which appeared to be quite soft with a disrupted annulus overlying the interspace. As a result it was elected to further decompress the interspace and remove loose disc material and this was done with a variety Epstein curettes and pituitary rongeurs. The redundant annulus was also removed with 2 mm Kerrison rongeur. There was evidence on the MRI of cephalad fragments and these were carefully removed and the L4 nerve root was also decompressed. At this point it was felt that all neural elements were well decompressed and there was no evidence of residual loose disc material within the interspace. The interspace was then irrigated with bacitracin saline and no additional disc material was mobilized. Hemostasis was assured with bipolar electrocautery and the interspace was irrigated with Depo-Medrol and fentanyl. The lumbodorsal fascia was closed with 0 Vicryl sutures the subcutaneous tissues reapproximated 2-0 Vicryl inverted sutures and the skin edges were reapproximated with 3-0 Vicryl subcuticular stitch. The wound is dressed with Dermabond. Patient was extubated in the operating room and taken to recovery in stable and satisfactory condition having tolerated his operation well counts were correct at the end of the case.  PLAN OF CARE: Admit for overnight observation  PATIENT DISPOSITION:  PACU - hemodynamically stable.     Delay start of Pharmacological VTE agent (>24hrs) due to surgical blood loss or risk of bleeding: yes  

## 2013-02-28 NOTE — Anesthesia Preprocedure Evaluation (Addendum)
Anesthesia Evaluation  Patient identified by MRN, date of birth, ID band Patient awake    Reviewed: Allergy & Precautions, H&P , NPO status , Patient's Chart, lab work & pertinent test results  Airway Mallampati: II  Neck ROM: full    Dental   Pulmonary asthma , former smoker,  02-23-13 Chest x-ray Findings: Scarring in the lung bases.  Heart is normal size.  No effusions.  No acute bony abnormality.   IMPRESSION: Bibasilar scarring.  No acute findings.             Cardiovascular hypertension, Pt. on medications   23-Feb-2013 16:05:44 [facility] ROUTINE RECORD Normal sinus rhythm Normal ECG   Neuro/Psych    GI/Hepatic negative GI ROS, Neg liver ROS,   Endo/Other  Morbid obesity  Renal/GU Hx Renal Calculus     Musculoskeletal  (+) Arthritis -, Osteoarthritis,    Abdominal   Peds  Hematology negative hematology ROS (+)   Anesthesia Other Findings   Reproductive/Obstetrics negative OB ROS                        Anesthesia Physical Anesthesia Plan  ASA: II  Anesthesia Plan: General   Post-op Pain Management:    Induction: Intravenous  Airway Management Planned: Oral ETT  Additional Equipment:   Intra-op Plan:   Post-operative Plan: Extubation in OR  Informed Consent: I have reviewed the patients History and Physical, chart, labs and discussed the procedure including the risks, benefits and alternatives for the proposed anesthesia with the patient or authorized representative who has indicated his/her understanding and acceptance.     Plan Discussed with: CRNA, Anesthesiologist and Surgeon  Anesthesia Plan Comments:         Anesthesia Quick Evaluation

## 2013-02-28 NOTE — Progress Notes (Signed)
Awake, alert, conversant.  Leg pain and weakness improved.  Pain improved.  Doing well.

## 2013-03-01 NOTE — Evaluation (Signed)
Occupational Therapy Evaluation Patient Details Name: Don [name] MRN: [id number] DOB: 05-Apr-1966 Today's Date: 03/01/2013 Time: 4098-1191 OT Time Calculation (min): 17 min  OT Assessment / Plan / Recommendation Clinical Impression  Pt s/p LUMBAR LAMINECTOMY/DECOMPRESSION MICRODISCECTOMY L4-5.  Education completed. Pt overall mod I level. No further OT needs.    OT Assessment  Patient does not need any further OT services    Follow Up Recommendations  No OT follow up;Supervision - Intermittent    Barriers to Discharge      Equipment Recommendations  None recommended by OT    Recommendations for Other Services    Frequency       Precautions / Restrictions Precautions Precautions: Back Precaution Comments: Educated pt on back precautions. Restrictions Weight Bearing Restrictions: No   Pertinent Vitals/Pain See vitals     ADL  Eating/Feeding: Performed;Independent Where Assessed - Eating/Feeding: Edge of bed Upper Body Bathing: Simulated;Modified independent Where Assessed - Upper Body Bathing: Unsupported sitting Lower Body Bathing: Simulated;Modified independent Where Assessed - Lower Body Bathing: Unsupported sit to stand Upper Body Dressing: Simulated;Modified independent Where Assessed - Upper Body Dressing: Unsupported sitting Lower Body Dressing: Performed;Modified independent Where Assessed - Lower Body Dressing: Unsupported sit to stand Toilet Transfer: Simulated;Modified independent Toilet Transfer Method:  (ambulating) Acupuncturist:  (bed) Transfers/Ambulation Related to ADLs: mod I without device ADL Comments: Pt able to cross ankles over knees to don/doff socks. Educated pt on using this technique for LB dressing.  Reviewed 3/3 back precautions with pt and how to maintain them during ADLs/IADLs.    OT Diagnosis:    OT Problem List:   OT Treatment Interventions:     OT Goals    Visit Information  Last OT Received On: 03/01/13     Subjective Data      Prior Functioning     Home Living Lives With: Spouse Available Help at Discharge: Family;Available 24 hours/day Type of Home: House Home Access: Stairs to enter [employer] of Steps: 2 Home Layout: Multi-level;Able to live on main level with bedroom/bathroom Bathroom Shower/Tub: Health visitor: Handicapped height Home Adaptive Equipment: None Prior Function Level of Independence: Independent Communication Communication: No difficulties Dominant Hand: Right         Vision/Perception     Cognition  Cognition Arousal/Alertness: Awake/alert Behavior During Therapy: WFL for tasks assessed/performed Overall Cognitive Status: Within Functional Limits for tasks assessed    Extremity/Trunk Assessment Right Upper Extremity Assessment RUE ROM/Strength/Tone: Within functional levels Left Upper Extremity Assessment LUE ROM/Strength/Tone: Within functional levels     Mobility Bed Mobility Bed Mobility: Right Sidelying to Sit;Rolling Right;Sit to Sidelying Right Rolling Right: 6: Modified independent (Device/Increase time) Right Sidelying to Sit: 6: Modified independent (Device/Increase time) Sit to Sidelying Right: 6: Modified independent (Device/Increase time) Details for Bed Mobility Assistance: Educated pt on log roll technqiue and pt able to return demonstration without difficulty. Transfers Transfers: Sit to Stand;Stand to Sit Sit to Stand: 6: Modified independent (Device/Increase time);From bed Stand to Sit: 6: Modified independent (Device/Increase time);To bed     Exercise     Balance     End of Session OT - End of Session Activity Tolerance: Patient tolerated treatment well Patient left: in bed;with family/visitor present;with call bell/phone within reach Nurse Communication: Mobility status  GO Functional Assessment Tool Used: clinical judgement Functional Limitation: Self care Self Care Current Status  (Y7829): 0 percent impaired, limited or restricted Self Care Goal Status (F6213): 0 percent impaired, limited or restricted Self Care Discharge Status (  N8295): 0 percent impaired, limited or restricted  03/01/2013 Cipriano Mile OTR/L Pager [phone] Office [phone]  Cipriano Mile 03/01/2013, 8:59 AM

## 2013-03-01 NOTE — Discharge Summary (Signed)
Physician Discharge Summary  Patient ID: Don [name] MRN: [id number] DOB/AGE: Jan 29, 1966 46 y.o.  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 *   Discharged Condition: good  Hospital Course: [name] was admitted for surgery 02/28/13 with Dx HNP L4-5 on the right.  Following uncomplicated microdiscectomy, he recovered nicely in Neuro PACU & transferred to 3500 for observation.  He has progressed well.  Consults: None  Significant Diagnostic Studies: radiology: X-Ray: intra-operative  Treatments: surgery: LUMBAR LAMINECTOMY/DECOMPRESSION MICRODISCECTOMY 1 LEVEL (Right) - Right Lumbar Four-Five Laminectomy with microdissection   Discharge Exam: Blood pressure 145/89, pulse 93, temperature 98.6 F (37 C), temperature source Oral, resp. rate 18, SpO2 93.00%. Doing well, ambulating. Full strength BLE. No leg pain. Dermabond on incision. No erythema, swelling, or drainage.  Disposition: 01-Home or Self Care Pt will call office to schedule f/u appt with DrStern.     Medication List    TAKE these medications       albuterol 108 (90 BASE) MCG/ACT inhaler  Commonly known as:  PROVENTIL HFA;VENTOLIN HFA  Inhale 2 puffs into the lungs every 6 (six) hours as needed for shortness of breath.     amLODipine 2.5 MG tablet  Commonly known as:  NORVASC  Take 2.5 mg by mouth daily.     cyclobenzaprine 10 MG tablet  Commonly known as:  FLEXERIL  Take 10 mg by mouth 3 (three) times daily as needed for muscle spasms.      diclofenac 75 MG EC tablet  Commonly known as:  VOLTAREN  Take 75 mg by mouth 2 (two) times daily.     Fluticasone-Salmeterol 100-50 MCG/DOSE Aepb  Commonly known as:  ADVAIR  Inhale 1 puff into the lungs every 12 (twelve) hours.     hydrochlorothiazide 25 MG tablet  Commonly known as:  HYDRODIURIL  Take 25 mg by mouth daily.     HYDROcodone-acetaminophen 5-325 MG per tablet  Commonly known as:  NORCO/VICODIN  Take 1 tablet by mouth every 6 (six) hours as needed for pain.     traMADol 50 MG tablet  Commonly known as:  ULTRAM  Take 50 mg by mouth every 8 (eight) hours as needed for pain.         Signed: Georgiann Cocker 03/01/2013, 10:59 AM

## 2013-03-01 NOTE — Progress Notes (Signed)
Subjective: Patient reports no leg pain, minimal back discomfort  Objective: Vital signs in last 24 hours: Temp:  [97.3 F (36.3 C)-98.9 F (37.2 C)] 98.2 F (36.8 C) (06/04 0352) Pulse Rate:  [73-113] 92 (06/04 0352) Resp:  [11-25] 18 (06/04 0352) BP: (116-156)/(82-96) 134/87 mmHg (06/04 0352) SpO2:  [92 %-96 %] 95 % (06/04 0352)  Intake/Output from previous day: 06/03 0701 - 06/04 0700 In: 1780 [P.O.:480; I.V.:1300] Out: 150 [Blood:150] Intake/Output this shift:    Physical Exam: Full strength.  Dressing CDI.  Lab Results: No results found for this basename: WBC, HGB, HCT, PLT,  in the last 72 hours BMET No results found for this basename: NA, K, CL, CO2, GLUCOSE, BUN, CREATININE, CALCIUM,  in the last 72 hours  Studies/Results: Dg Lumbar Spine 1 View  02/28/2013   *RADIOLOGY REPORT*  Clinical Data: Back pain  LUMBAR SPINE - 1 VIEW  Comparison: MRI 01/05/2013.  Findings: Cross-table lateral film #1 at 1035 hours demonstrates a probe at L4-L5.  IMPRESSION: L4-L5 marked.   Original Report Authenticated By: Davonna Belling, M.D.    Assessment/Plan: Doing well. D/C home.     LOS: 1 day    Dorian Heckle, MD 03/01/2013, 6:03 AM

## 2013-03-01 NOTE — Progress Notes (Signed)
Pt and wife given D/C instructions with verbal understanding. Pt D/C'd home via wheelchair @ [phone] per MD order. Rema Fendt, RN

## 2013-03-02 ENCOUNTER — Encounter (HOSPITAL_COMMUNITY): Payer: Self-pay | Admitting: Neurosurgery

## 2013-06-28 ENCOUNTER — Other Ambulatory Visit: Payer: Self-pay | Admitting: Family Medicine

## 2013-06-28 ENCOUNTER — Ambulatory Visit
Admission: RE | Admit: 2013-06-28 | Discharge: 2013-06-28 | Disposition: A | Discharge status: Home / Self Care | Payer: 59 | Source: Ambulatory Visit | Attending: Family Medicine | Admitting: Family Medicine

## 2013-06-28 DIAGNOSIS — R0602 Shortness of breath: Secondary | ICD-10-CM

## 2013-10-25 ENCOUNTER — Other Ambulatory Visit: Payer: Self-pay | Admitting: Orthopedic Surgery

## 2013-10-25 DIAGNOSIS — M25819 Other specified joint disorders, unspecified shoulder: Secondary | ICD-10-CM

## 2013-10-25 DIAGNOSIS — M7580 Other shoulder lesions, unspecified shoulder: Principal | ICD-10-CM

## 2013-10-30 ENCOUNTER — Other Ambulatory Visit: Payer: Self-pay

## 2013-11-02 ENCOUNTER — Ambulatory Visit
Admission: RE | Admit: 2013-11-02 | Discharge: 2013-11-02 | Disposition: A | Discharge status: Home / Self Care | Payer: 59 | Source: Ambulatory Visit | Attending: Orthopedic Surgery | Admitting: Orthopedic Surgery

## 2013-11-02 DIAGNOSIS — M25819 Other specified joint disorders, unspecified shoulder: Secondary | ICD-10-CM

## 2013-11-02 DIAGNOSIS — M7580 Other shoulder lesions, unspecified shoulder: Principal | ICD-10-CM

## 2014-03-29 ENCOUNTER — Encounter (HOSPITAL_COMMUNITY): Payer: Self-pay

## 2014-03-29 ENCOUNTER — Ambulatory Visit (HOSPITAL_COMMUNITY)
Admission: RE | Admit: 2014-03-29 | Discharge: 2014-03-29 | Disposition: A | Discharge status: Home / Self Care | Payer: 59 | Source: Ambulatory Visit | Attending: Family Medicine | Admitting: Family Medicine

## 2014-03-29 ENCOUNTER — Other Ambulatory Visit (HOSPITAL_COMMUNITY): Payer: Self-pay | Admitting: Family Medicine

## 2014-03-29 DIAGNOSIS — R0602 Shortness of breath: Secondary | ICD-10-CM | POA: Insufficient documentation

## 2014-03-29 DIAGNOSIS — M8448XA Pathological fracture, other site, initial encounter for fracture: Secondary | ICD-10-CM | POA: Insufficient documentation

## 2014-03-29 DIAGNOSIS — K7689 Other specified diseases of liver: Secondary | ICD-10-CM | POA: Insufficient documentation

## 2014-03-29 DIAGNOSIS — R911 Solitary pulmonary nodule: Secondary | ICD-10-CM | POA: Insufficient documentation

## 2014-03-29 DIAGNOSIS — J90 Pleural effusion, not elsewhere classified: Secondary | ICD-10-CM | POA: Insufficient documentation

## 2014-03-29 MED ORDER — IOHEXOL 300 MG/ML  SOLN
80.0000 mL | Freq: Once | INTRAMUSCULAR | Status: AC | PRN
  Administered 2014-03-29: 80 mL via INTRAVENOUS

## 2014-04-11 ENCOUNTER — Other Ambulatory Visit (HOSPITAL_COMMUNITY): Payer: Self-pay

## 2014-04-13 ENCOUNTER — Ambulatory Visit (HOSPITAL_COMMUNITY): Payer: 59 | Attending: Internal Medicine | Admitting: Cardiology

## 2014-04-13 ENCOUNTER — Other Ambulatory Visit (HOSPITAL_COMMUNITY): Payer: Self-pay | Admitting: Family Medicine

## 2014-04-13 DIAGNOSIS — R0602 Shortness of breath: Secondary | ICD-10-CM

## 2014-04-13 DIAGNOSIS — R0989 Other specified symptoms and signs involving the circulatory and respiratory systems: Principal | ICD-10-CM | POA: Insufficient documentation

## 2014-04-13 DIAGNOSIS — R0609 Other forms of dyspnea: Secondary | ICD-10-CM | POA: Insufficient documentation

## 2014-04-13 NOTE — Progress Notes (Signed)
Echo performed. 

## 2014-04-19 ENCOUNTER — Other Ambulatory Visit: Payer: Self-pay | Admitting: Family Medicine

## 2014-04-19 DIAGNOSIS — N281 Cyst of kidney, acquired: Secondary | ICD-10-CM

## 2014-05-15 ENCOUNTER — Ambulatory Visit
Admission: RE | Admit: 2014-05-15 | Discharge: 2014-05-15 | Disposition: A | Discharge status: Home / Self Care | Payer: 59 | Source: Ambulatory Visit | Attending: Family Medicine | Admitting: Family Medicine

## 2014-05-15 DIAGNOSIS — N281 Cyst of kidney, acquired: Secondary | ICD-10-CM

## 2014-07-03 ENCOUNTER — Encounter (HOSPITAL_BASED_OUTPATIENT_CLINIC_OR_DEPARTMENT_OTHER): Payer: 59

## 2015-01-25 ENCOUNTER — Other Ambulatory Visit (HOSPITAL_COMMUNITY): Payer: Self-pay | Admitting: Neurosurgery

## 2015-01-25 DIAGNOSIS — M5416 Radiculopathy, lumbar region: Secondary | ICD-10-CM

## 2015-02-04 ENCOUNTER — Encounter (HOSPITAL_COMMUNITY): Payer: Self-pay

## 2015-02-04 ENCOUNTER — Encounter (HOSPITAL_COMMUNITY)
Admission: RE | Admit: 2015-02-04 | Discharge: 2015-02-04 | Disposition: A | Discharge status: Home / Self Care | Payer: Worker's Compensation | Source: Ambulatory Visit | Attending: Neurosurgery | Admitting: Neurosurgery

## 2015-02-04 DIAGNOSIS — M79604 Pain in right leg: Secondary | ICD-10-CM | POA: Diagnosis present

## 2015-02-04 DIAGNOSIS — M5116 Intervertebral disc disorders with radiculopathy, lumbar region: Secondary | ICD-10-CM | POA: Diagnosis not present

## 2015-02-04 DIAGNOSIS — F40240 Claustrophobia: Secondary | ICD-10-CM | POA: Diagnosis not present

## 2015-02-04 HISTORY — DX: Major depressive disorder, single episode, unspecified: F32.9

## 2015-02-04 HISTORY — DX: Claustrophobia: F40.240

## 2015-02-04 HISTORY — DX: Depression, unspecified: F32.A

## 2015-02-04 LAB — CBC
HCT: 42 % (ref 39.0–52.0)
HEMOGLOBIN: 14 g/dL (ref 13.0–17.0)
MCH: 27.9 pg (ref 26.0–34.0)
MCHC: 33.3 g/dL (ref 30.0–36.0)
MCV: 83.8 fL (ref 78.0–100.0)
PLATELETS: 296 10*3/uL (ref 150–400)
RBC: 5.01 MIL/uL (ref 4.22–5.81)
RDW: 14.3 % (ref 11.5–15.5)
WBC: 11.2 10*3/uL — ABNORMAL HIGH (ref 4.0–10.5)

## 2015-02-04 LAB — BASIC METABOLIC PANEL
Anion gap: 8 (ref 5–15)
BUN: 17 mg/dL (ref 6–20)
CALCIUM: 9 mg/dL (ref 8.9–10.3)
CHLORIDE: 105 mmol/L (ref 101–111)
CO2: 25 mmol/L (ref 22–32)
Creatinine, Ser: 1.14 mg/dL (ref 0.61–1.24)
GFR calc Af Amer: >60 mL/min (ref >60)
Glucose, Bld: 101 mg/dL — ABNORMAL HIGH (ref 70–99)
POTASSIUM: 3.8 mmol/L (ref 3.5–5.1)
Sodium: 138 mmol/L (ref 135–145)

## 2015-02-04 NOTE — Progress Notes (Signed)
PCP is Dibas Koirala. Patient denied having any acute cardiac issues, but informed Nurse that he last used his Albuterol inhaler a week ago. Patients wife was at chair side during PAT visit.

## 2015-02-04 NOTE — Progress Notes (Signed)
Nurse called Revonda StandardAllison, PA and informed her of EKG results.

## 2015-02-04 NOTE — Pre-Procedure Instructions (Signed)
Tonna CornerMichael Smet  02/04/2015   Your procedure is scheduled on:  Tuesday Feb 05, 2015 at 8:00 AM.  [facility] Ad[facility] at 5:30 AM.  Call this number if you have problems the morning of surgery: [phone]   Remember:   Do not eat food or drink liquids after midnight.   Take these medicines the morning of surgery with A SIP OF WATER: Albuterol inhaler if needed, Amlodipine (Norvasc), Advair inhaler, Sertraline (Zoloft)   Please stop taking any vitamins, Diclofenac/Voltaren, herbal medications, Advil, Motrin, Alleve, etc   Do not wear jewelry.  Do not wear lotions, powders, or cologne.   Men may shave face and neck.  Do not bring valuables to the hospital.  [facility] is not responsible for any belongings or valuables.               Contacts, dentures or bridgework may not be worn into surgery.  Leave suitcase in the car. After surgery it may be brought to your room.  For patients admitted to the hospital, discharge time is determined by your treatment team.               Patients discharged the day of surgery will not be allowed to drive home.  Name and phone number of your driver:   Special Instructions: Shower using CHG soap the night before and the morning of your surgery   Please read over the following fact sheets that you were given: Pain Booklet, Coughing and Deep Breathing and Surgical Site Infection Prevention

## 2015-02-04 NOTE — Progress Notes (Signed)
Anesthesia Chart Review:  Patient is a [age] year old male scheduled for MRI L-spine tomorrow.  Test was ordered by Dr. Venetia MaxonStern.  History includes former smoker, HTN, asthma, depression, claustrophobia, tonsillectomy. PCP is listed as Dr. Docia ChuckKoirala.   02/04/15 EKG: NSr, possible inferior infarct (age undetermined). Overall, I think his EKG is stable when compared to his 02/23/13 tracing.   04/13/14 Echo: Left ventricle: The cavity size was normal. Wall thickness was increased in a pattern of mild LVH. Systolic function was vigorous. The estimated ejection fraction was in the range of 65% to 70%. Doppler parameters are consistent with abnormal left ventricular relaxation (grade 1 diastolic dysfunction).  Pre-operative labs noted.  H&P requested from Dr. Fredrich BirksStern's office by Arnell SievingLynn, Short Stay Surgical Director earlier today.  Velna Ochsllison Zelenak, PA-C [facility]/Anesthesiology Phone [phone] 02/04/2015 3:59 PM

## 2015-02-05 ENCOUNTER — Ambulatory Visit (HOSPITAL_COMMUNITY)
Admission: RE | Admit: 2015-02-05 | Discharge: 2015-02-05 | Disposition: A | Discharge status: Home / Self Care | Payer: Worker's Compensation | Source: Ambulatory Visit | Attending: Neurosurgery | Admitting: Neurosurgery

## 2015-02-05 ENCOUNTER — Ambulatory Visit (HOSPITAL_COMMUNITY): Payer: Worker's Compensation | Admitting: Anesthesiology

## 2015-02-05 ENCOUNTER — Encounter (HOSPITAL_COMMUNITY): Payer: Self-pay | Admitting: *Deleted

## 2015-02-05 ENCOUNTER — Encounter (HOSPITAL_COMMUNITY)
Admission: RE | Disposition: A | Discharge status: Home / Self Care | Payer: Self-pay | Source: Ambulatory Visit | Attending: Neurosurgery

## 2015-02-05 DIAGNOSIS — M5416 Radiculopathy, lumbar region: Secondary | ICD-10-CM

## 2015-02-05 DIAGNOSIS — M5116 Intervertebral disc disorders with radiculopathy, lumbar region: Secondary | ICD-10-CM | POA: Diagnosis not present

## 2015-02-05 DIAGNOSIS — F40240 Claustrophobia: Secondary | ICD-10-CM | POA: Insufficient documentation

## 2015-02-05 HISTORY — PX: RADIOLOGY WITH ANESTHESIA: SHX6223

## 2015-02-05 SURGERY — RADIOLOGY WITH ANESTHESIA
Anesthesia: Monitor Anesthesia Care

## 2015-02-05 MED ORDER — ONDANSETRON HCL 4 MG/2ML IJ SOLN: 4.0000 mg | Freq: Once | INTRAMUSCULAR | Status: DC | PRN

## 2015-02-05 MED ORDER — GADOBENATE DIMEGLUMINE 529 MG/ML IV SOLN
20.0000 mL | Freq: Once | INTRAVENOUS | Status: AC
  Administered 2015-02-05: 20 mL via INTRAVENOUS

## 2015-02-05 NOTE — Anesthesia Preprocedure Evaluation (Signed)
Anesthesia Evaluation  Patient identified by MRN, date of birth, ID band Patient awake    Reviewed: Allergy & Precautions, NPO status , Patient's Chart, lab work & pertinent test results  Airway Mallampati: II  TM Distance: >3 FB     Dental   Pulmonary asthma , former smoker,    Pulmonary exam normal       Cardiovascular hypertension, Normal cardiovascular exam    Neuro/Psych Anxiety    GI/Hepatic   Endo/Other  Morbid obesity  Renal/GU Renal disease     Musculoskeletal   Abdominal   Peds  Hematology   Anesthesia Other Findings   Reproductive/Obstetrics                             Anesthesia Physical Anesthesia Plan  ASA: III  Anesthesia Plan: MAC and General   Post-op Pain Management:    Induction: Intravenous  Airway Management Planned: Oral ETT and Mask  Additional Equipment:   Intra-op Plan:   Post-operative Plan: Extubation in OR  Informed Consent: I have reviewed the patients History and Physical, chart, labs and discussed the procedure including the risks, benefits and alternatives for the proposed anesthesia with the patient or authorized representative who has indicated his/her understanding and acceptance.     Plan Discussed with: CRNA, Anesthesiologist and Surgeon  Anesthesia Plan Comments:         Anesthesia Quick Evaluation

## 2015-02-05 NOTE — Transfer of Care (Signed)
Immediate Anesthesia Transfer of Care Note  Patient: Don [name]  Procedure(s) Performed: Procedure(s): MRI - Lumbar Spine (N/A)  Patient Location: PACU  Anesthesia Type:General  Level of Consciousness: awake, alert , oriented and patient cooperative  Airway & Oxygen Therapy: Patient Spontanous Breathing and Patient connected to nasal cannula oxygen  Post-op Assessment: Report given to RN, Post -op Vital signs reviewed and stable and Patient moving all extremities  Post vital signs: Reviewed and stable  Last Vitals:  Filed Vitals:   02/05/15 0911  BP:   Pulse:   Temp: 36.7 C  Resp:     Complications: No apparent anesthesia complications

## 2015-02-05 NOTE — Anesthesia Postprocedure Evaluation (Signed)
  Anesthesia Post-op Note  Patient: Don [name]  Procedure(s) Performed: Procedure(s): MRI - Lumbar Spine (N/A)  Patient Location: PACU  Anesthesia Type:General  Level of Consciousness: awake, alert , oriented and patient cooperative  Airway and Oxygen Therapy: Patient Spontanous Breathing  Post-op Pain: none  Post-op Assessment: Post-op Vital signs reviewed, Patient's Cardiovascular Status Stable, Respiratory Function Stable, Patent Airway, No signs of Nausea or vomiting and Pain level controlled  Post-op Vital Signs: stable  Last Vitals:  Filed Vitals:   02/05/15 0911  BP:   Pulse:   Temp: 36.7 C  Resp:     Complications: No apparent anesthesia complications

## 2015-02-05 NOTE — Transfer of Care (Signed)
Immediate Anesthesia Transfer of Care Note  Patient: Don [name]  Procedure(s) Performed: Procedure(s): MRI - Lumbar Spine (N/A)  Patient Location: PACU  Anesthesia Type:General  Level of Consciousness: awake, alert , oriented and patient cooperative  Airway & Oxygen Therapy: Patient Spontanous Breathing  Post-op Assessment: Report given to RN, Post -op Vital signs reviewed and stable, Patient moving all extremities, Patient moving all extremities X 4 and Patient able to stick tongue midline  Post vital signs: stable  Last Vitals:  Filed Vitals:   02/05/15 0553  BP: 118/73  Pulse: 77  Temp: 36.6 C  Resp: 20    Complications: No apparent anesthesia complications

## 2015-02-06 ENCOUNTER — Encounter (HOSPITAL_COMMUNITY): Payer: Self-pay | Admitting: Radiology

## 2015-02-07 NOTE — H&P (Signed)
Patient ID:   [id number] Patient: Don [name]  Date of Birth: 03-Mar-1966 Visit Type: Office Visit   Date: 01/02/2015 09:00 AM Provider: Danae OrleansJoseph D. Venetia MaxonStern MD   This 49 year old male presents for back pain.  History of Present Illness: 1.  back pain  02/28/13 right L4-L5 microdiscectomy Injections by Dr. Ollen BowlHarkins September 2015, October 2015, January 2016, offered no lasting relief.  Patient returns reporting some increased right lower extremity pain buttock to calf with walking.  Sitting, he only notices discomfort.  He has been working on weight loss and is down 30 pounds.  Diclofenac 75 mg taken 0-2/day  Despite multiple injections the patient is still complaining of right sided leg pain and actually feels that it is getting worse.  He notes coldness in his right foot.  On my examination the patient has EHL strength of 4 out of 5 on the right.  He has discomfort with sitting and pain with walking.  At this point since he does not appear to be making significant progress I have recommended that we repeat an MRI of his lumbar spine.  He says that he is very claustrophobic and was not able to tolerate the prior MRI and that he requires more than the oral Valium sedation.  I therefore recommended that this be performed at the hospital.  I can make further recommendations after this imaging study has been obtained.  He does not seem to be in a position of being able to advance his activities and return to more full-time unrestricted work because of persistent to progressive radicular pain.      Medical/Surgical/Interim History Reviewed, no change.  Last detailed document date:01/01/2014.   PAST MEDICAL HISTORY, SURGICAL HISTORY, FAMILY HISTORY, SOCIAL HISTORY AND REVIEW OF SYSTEMS I have reviewed the patient's past medical, surgical, family and social history as well as the comprehensive review of systems as included on the [facility] history form dated  08/27/2014, which I have signed.  Family History: Reviewed, no changes.  Last detailed document: 01/01/2014.   Social History: Tobacco use reviewed. Reviewed, no changes. Last detailed document date: 01/01/2014.      MEDICATIONS(added, continued or stopped this visit): Started Medication Directions Instruction Stopped   Advair Diskus 250 mcg-50 mcg/dose powder for inhalation inhale 1 puff by inhalation route 2 times every day in the morning and evening approximately 12 hours apart     diclofenac 75 mg ORAL     12/17/2014 DICLOFENAC SOD DR 75 MG TAB TAKE 1 TABLET BY MOUTH TWICE A DAY     hydrochlorothiazide 25 mg tablet     04/30/2014 Valium 10 mg tablet take 1  tab PO prior to procedure       ALLERGIES: Ingredient Reaction Medication Name Comment  LISINOPRIL fatigue & body pain        Vitals Date Temp F BP Pulse Ht In Wt Lb BMI BSA Pain Score  01/02/2015  123/82 97 69 286 42.23  5/10      IMPRESSION Patient is having weakness and evidence of L5 radiculopathy on the right.  Completed Orders (this encounter) Order Details Reason Side Interpretation Result Initial Treatment Date Region  Dietary management education, guidance, and counseling Encouraged to eat a well balanced diet and follow up with primary care physician.         Assessment/Plan # Detail Type Description   1. Assessment Radiculopathy, lumbar region (M54.16).       2. 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       3. Assessment Body mass index (BMI) 40.0-44.9, adult (Z68.41).   Plan Orders Today's instructions / counseling include(s) Dietary management education, guidance, and counseling.         Pain Assessment/Treatment Pain Scale: 5/10. Method: Numeric Pain Intensity Scale. Location: back. Onset: 08/03/2012. Duration: varies. Quality: discomforting. Pain Assessment/Treatment follow-up plan of care: Patient taking medications as prescribed..  Fall  Risk Plan The patient has not fallen in the last year.  I have advised the patient undergo a new MRI and he will do this with sedation at the hospital and follow up with me after that has been obtained.  Orders: Diagnostic Procedures: Assessment Procedure  M54.16 MRI Spine/lumb With & W/o Contrast  M54.16 Return to Clinic after study is performed  Instruction(s)/Education: Assessment Instruction  Z68.41 Dietary management education, guidance, and counseling             Provider:  Danae OrleansJoseph D. Venetia MaxonStern MD  01/05/2015 03:07 PM Dictation edited by: Danae OrleansJoseph D. Venetia MaxonStern    CC Providers: Maeola HarmanJoseph Zong Mcquarrie MD [address] [place], [place] [id number]              Electronically signed by Danae OrleansJoseph D. Venetia MaxonStern MD on 01/05/2015 03:09 PM

## 2015-03-20 ENCOUNTER — Other Ambulatory Visit: Payer: Self-pay | Admitting: Neurosurgery

## 2015-04-08 ENCOUNTER — Encounter (HOSPITAL_COMMUNITY)
Admission: RE | Admit: 2015-04-08 | Discharge: 2015-04-08 | Disposition: A | Discharge status: Home / Self Care | Payer: 59 | Source: Ambulatory Visit | Attending: Neurosurgery | Admitting: Neurosurgery

## 2015-04-08 ENCOUNTER — Encounter (HOSPITAL_COMMUNITY): Payer: Self-pay

## 2015-04-08 DIAGNOSIS — Z01812 Encounter for preprocedural laboratory examination: Secondary | ICD-10-CM | POA: Insufficient documentation

## 2015-04-08 DIAGNOSIS — M5126 Other intervertebral disc displacement, lumbar region: Secondary | ICD-10-CM | POA: Diagnosis not present

## 2015-04-08 LAB — SURGICAL PCR SCREEN
MRSA, PCR: NEGATIVE
Staphylococcus aureus: NEGATIVE

## 2015-04-08 NOTE — Progress Notes (Signed)
Pts was rescheduled for surgery to the 26th.  Did pat visit except for lab.  Pt will reschedule lab work.

## 2015-04-08 NOTE — Pre-Procedure Instructions (Signed)
    Tonna CornerMichael Janosik  04/08/2015      [id number] [place]- [place], [place] - [address] [place] [address] Phone: [phone] Fax: [phone]    Your procedure is scheduled on 04/16/15.  [facility] to [facility] Admitting at 930 A.M.  Call this number if you have problems the morning of surgery:  [phone]   Remember:  Do not eat food or drink liquids after midnight.  Take these medicines the morning of surgery with A SIP OF WATER all inhalers,norvasc,zoloft   Do not wear jewelry, make-up or nail polish.  Do not wear lotions, powders, or perfumes.  You may wear deodorant.  Do not shave 48 hours prior to surgery.  Men may shave face and neck.  Do not bring valuables to the hospital.  [facility] is not responsible for any belongings or valuables.  Contacts, dentures or bridgework may not be worn into surgery.  Leave your suitcase in the car.  After surgery it may be brought to your room.  For patients admitted to the hospital, discharge time will be determined by your treatment team.  Patients discharged the day of surgery will not be allowed to drive home. Name and phone number of your driver:    Special instructions:    Please read over the following fact sheets that you were given. Pain Booklet, Coughing and Deep Breathing, Blood Transfusion Information, MRSA Information and Surgical Site Infection Prevention

## 2015-04-19 ENCOUNTER — Encounter (HOSPITAL_COMMUNITY)
Admission: RE | Admit: 2015-04-19 | Discharge: 2015-04-19 | Disposition: A | Discharge status: Home / Self Care | Payer: 59 | Source: Ambulatory Visit | Attending: Neurosurgery | Admitting: Neurosurgery

## 2015-04-19 DIAGNOSIS — Z0183 Encounter for blood typing: Secondary | ICD-10-CM | POA: Insufficient documentation

## 2015-04-19 DIAGNOSIS — M5126 Other intervertebral disc displacement, lumbar region: Secondary | ICD-10-CM | POA: Diagnosis not present

## 2015-04-19 DIAGNOSIS — Z01812 Encounter for preprocedural laboratory examination: Secondary | ICD-10-CM | POA: Diagnosis present

## 2015-04-19 LAB — ABO/RH: ABO/RH(D): B NEG

## 2015-04-19 LAB — BASIC METABOLIC PANEL
Anion gap: 5 (ref 5–15)
BUN: 18 mg/dL (ref 6–20)
CHLORIDE: 106 mmol/L (ref 101–111)
CO2: 26 mmol/L (ref 22–32)
Calcium: 9 mg/dL (ref 8.9–10.3)
Creatinine, Ser: 1.05 mg/dL (ref 0.61–1.24)
GFR calc Af Amer: >60 mL/min (ref >60)
GFR calc non Af Amer: >60 mL/min (ref >60)
GLUCOSE: 87 mg/dL (ref 65–99)
POTASSIUM: 3.7 mmol/L (ref 3.5–5.1)
SODIUM: 137 mmol/L (ref 135–145)

## 2015-04-19 LAB — CBC
HCT: 45 % (ref 39.0–52.0)
HEMOGLOBIN: 15 g/dL (ref 13.0–17.0)
MCH: 28.5 pg (ref 26.0–34.0)
MCHC: 33.3 g/dL (ref 30.0–36.0)
MCV: 85.4 fL (ref 78.0–100.0)
Platelets: 287 10*3/uL (ref 150–400)
RBC: 5.27 MIL/uL (ref 4.22–5.81)
RDW: 14 % (ref 11.5–15.5)
WBC: 11.7 10*3/uL — ABNORMAL HIGH (ref 4.0–10.5)

## 2015-04-22 NOTE — Discharge Instructions (Signed)

## 2015-04-23 ENCOUNTER — Encounter (HOSPITAL_COMMUNITY): Payer: Self-pay | Admitting: General Practice

## 2015-04-23 ENCOUNTER — Inpatient Hospital Stay (HOSPITAL_COMMUNITY): Payer: Worker's Compensation | Admitting: Anesthesiology

## 2015-04-23 ENCOUNTER — Inpatient Hospital Stay (HOSPITAL_COMMUNITY)
Admission: RE | Admit: 2015-04-23 | Discharge: 2015-04-24 | DRG: 460 | Disposition: A | Discharge status: Home / Self Care | Payer: Worker's Compensation | Source: Ambulatory Visit | Attending: Neurosurgery | Admitting: Neurosurgery

## 2015-04-23 ENCOUNTER — Inpatient Hospital Stay (HOSPITAL_COMMUNITY): Payer: Worker's Compensation

## 2015-04-23 ENCOUNTER — Encounter (HOSPITAL_COMMUNITY)
Admission: RE | Disposition: A | Discharge status: Home / Self Care | Payer: Self-pay | Source: Ambulatory Visit | Attending: Neurosurgery

## 2015-04-23 DIAGNOSIS — Z79899 Other long term (current) drug therapy: Secondary | ICD-10-CM | POA: Diagnosis not present

## 2015-04-23 DIAGNOSIS — J45909 Unspecified asthma, uncomplicated: Secondary | ICD-10-CM | POA: Diagnosis present

## 2015-04-23 DIAGNOSIS — M5116 Intervertebral disc disorders with radiculopathy, lumbar region: Principal | ICD-10-CM | POA: Diagnosis present

## 2015-04-23 DIAGNOSIS — M545 Low back pain: Secondary | ICD-10-CM | POA: Diagnosis present

## 2015-04-23 DIAGNOSIS — M549 Dorsalgia, unspecified: Secondary | ICD-10-CM

## 2015-04-23 DIAGNOSIS — Z6841 Body Mass Index (BMI) 40.0 and over, adult: Secondary | ICD-10-CM | POA: Diagnosis not present

## 2015-04-23 DIAGNOSIS — Z87891 Personal history of nicotine dependence: Secondary | ICD-10-CM | POA: Diagnosis not present

## 2015-04-23 DIAGNOSIS — M5126 Other intervertebral disc displacement, lumbar region: Secondary | ICD-10-CM | POA: Diagnosis present

## 2015-04-23 DIAGNOSIS — I10 Essential (primary) hypertension: Secondary | ICD-10-CM | POA: Diagnosis present

## 2015-04-23 DIAGNOSIS — M4726 Other spondylosis with radiculopathy, lumbar region: Secondary | ICD-10-CM | POA: Diagnosis present

## 2015-04-23 HISTORY — PX: MAXIMUM ACCESS (MAS)POSTERIOR LUMBAR INTERBODY FUSION (PLIF) 1 LEVEL: SHX6368

## 2015-04-23 LAB — TYPE AND SCREEN
ABO/RH(D): B NEG
ANTIBODY SCREEN: NEGATIVE

## 2015-04-23 SURGERY — FOR MAXIMUM ACCESS (MAS) POSTERIOR LUMBAR INTERBODY FUSION (PLIF) 1 LEVEL
Anesthesia: General | Site: Back

## 2015-04-23 MED ORDER — METHOCARBAMOL 500 MG PO TABS
500.0000 mg | ORAL_TABLET | Freq: Four times a day (QID) | ORAL | Status: DC | PRN
  Administered 2015-04-23 – 2015-04-24 (×3): 500 mg via ORAL
  Filled 2015-04-23 (×4): qty 1

## 2015-04-23 MED ORDER — HYDROCHLOROTHIAZIDE 25 MG PO TABS
12.5000 mg | ORAL_TABLET | Freq: Every day | ORAL | Status: DC
  Administered 2015-04-24: 12.5 mg via ORAL
  Filled 2015-04-23: qty 0.5

## 2015-04-23 MED ORDER — FENTANYL CITRATE (PF) 100 MCG/2ML IJ SOLN
INTRAMUSCULAR | Status: DC | PRN
  Administered 2015-04-23: 250 ug via INTRAVENOUS
  Administered 2015-04-23 (×2): 50 ug via INTRAVENOUS

## 2015-04-23 MED ORDER — CEFAZOLIN SODIUM-DEXTROSE 2-3 GM-% IV SOLR
2.0000 g | INTRAVENOUS | Status: AC
  Administered 2015-04-23: 2 g via INTRAVENOUS
  Filled 2015-04-23: qty 50

## 2015-04-23 MED ORDER — MIDAZOLAM HCL 2 MG/2ML IJ SOLN
INTRAMUSCULAR | Status: AC
  Filled 2015-04-23: qty 2

## 2015-04-23 MED ORDER — LACTATED RINGERS IV SOLN
INTRAVENOUS | Status: DC
  Administered 2015-04-23: 10:00:00 via INTRAVENOUS

## 2015-04-23 MED ORDER — KCL IN DEXTROSE-NACL 20-5-0.45 MEQ/L-%-% IV SOLN
INTRAVENOUS | Status: DC
  Filled 2015-04-23 (×3): qty 1000

## 2015-04-23 MED ORDER — HYDROMORPHONE HCL 1 MG/ML IJ SOLN
0.2500 mg | INTRAMUSCULAR | Status: DC | PRN
  Administered 2015-04-23: 0.5 mg via INTRAVENOUS

## 2015-04-23 MED ORDER — AMLODIPINE BESYLATE 5 MG PO TABS
5.0000 mg | ORAL_TABLET | Freq: Every day | ORAL | Status: DC
Start: 2015-04-24 — End: 2015-04-24
  Administered 2015-04-24: 5 mg via ORAL
  Filled 2015-04-23: qty 1

## 2015-04-23 MED ORDER — ESMOLOL HCL 10 MG/ML IV SOLN
INTRAVENOUS | Status: DC | PRN
  Administered 2015-04-23: 30 mg via INTRAVENOUS

## 2015-04-23 MED ORDER — MENTHOL 3 MG MT LOZG: 1.0000 | LOZENGE | OROMUCOSAL | Status: DC | PRN

## 2015-04-23 MED ORDER — MIDAZOLAM HCL 5 MG/5ML IJ SOLN
INTRAMUSCULAR | Status: DC | PRN
  Administered 2015-04-23: 2 mg via INTRAVENOUS

## 2015-04-23 MED ORDER — SENNOSIDES-DOCUSATE SODIUM 8.6-50 MG PO TABS
1.0000 | ORAL_TABLET | Freq: Every evening | ORAL | Status: DC | PRN
  Filled 2015-04-23: qty 1

## 2015-04-23 MED ORDER — PROPOFOL 10 MG/ML IV BOLUS
INTRAVENOUS | Status: DC | PRN
  Administered 2015-04-23: 200 mg via INTRAVENOUS

## 2015-04-23 MED ORDER — SODIUM CHLORIDE 0.9 % IJ SOLN
INTRAMUSCULAR | Status: AC
  Filled 2015-04-23: qty 10

## 2015-04-23 MED ORDER — SODIUM CHLORIDE 0.9 % IJ SOLN: 3.0000 mL | INTRAMUSCULAR | Status: DC | PRN

## 2015-04-23 MED ORDER — SODIUM CHLORIDE 0.9 % IV SOLN
10.0000 mg | INTRAVENOUS | Status: DC | PRN
  Administered 2015-04-23: 50 ug/min via INTRAVENOUS

## 2015-04-23 MED ORDER — STERILE WATER FOR INJECTION IJ SOLN
INTRAMUSCULAR | Status: AC
  Filled 2015-04-23: qty 20

## 2015-04-23 MED ORDER — LIDOCAINE-EPINEPHRINE 1 %-1:100000 IJ SOLN
INTRAMUSCULAR | Status: DC | PRN
  Administered 2015-04-23: 10 mL

## 2015-04-23 MED ORDER — HYDROMORPHONE HCL 1 MG/ML IJ SOLN
INTRAMUSCULAR | Status: AC
  Filled 2015-04-23: qty 1

## 2015-04-23 MED ORDER — IRBESARTAN 75 MG PO TABS
75.0000 mg | ORAL_TABLET | Freq: Every day | ORAL | Status: DC
  Administered 2015-04-24: 75 mg via ORAL
  Filled 2015-04-23: qty 1

## 2015-04-23 MED ORDER — ONDANSETRON HCL 4 MG/2ML IJ SOLN: 4.0000 mg | Freq: Once | INTRAMUSCULAR | Status: DC | PRN

## 2015-04-23 MED ORDER — ACETAMINOPHEN 325 MG PO TABS: 650.0000 mg | ORAL_TABLET | ORAL | Status: DC | PRN

## 2015-04-23 MED ORDER — PHENOL 1.4 % MT LIQD: 1.0000 | OROMUCOSAL | Status: DC | PRN

## 2015-04-23 MED ORDER — HYDROMORPHONE HCL 2 MG PO TABS
2.0000 mg | ORAL_TABLET | Freq: Four times a day (QID) | ORAL | Status: DC | PRN
  Administered 2015-04-23 – 2015-04-24 (×4): 4 mg via ORAL
  Filled 2015-04-23 (×4): qty 2

## 2015-04-23 MED ORDER — LACTATED RINGERS IV SOLN
INTRAVENOUS | Status: DC | PRN
  Administered 2015-04-23 (×2): via INTRAVENOUS

## 2015-04-23 MED ORDER — BUPIVACAINE LIPOSOME 1.3 % IJ SUSP
INTRAMUSCULAR | Status: DC | PRN
  Administered 2015-04-23: 20 mL

## 2015-04-23 MED ORDER — SERTRALINE HCL 50 MG PO TABS
50.0000 mg | ORAL_TABLET | Freq: Every day | ORAL | Status: DC
  Administered 2015-04-23 – 2015-04-24 (×2): 50 mg via ORAL
  Filled 2015-04-23 (×2): qty 1

## 2015-04-23 MED ORDER — ONDANSETRON HCL 4 MG/2ML IJ SOLN
INTRAMUSCULAR | Status: AC
  Filled 2015-04-23: qty 6

## 2015-04-23 MED ORDER — ARTIFICIAL TEARS OP OINT
TOPICAL_OINTMENT | OPHTHALMIC | Status: AC
  Filled 2015-04-23: qty 3.5

## 2015-04-23 MED ORDER — HYDROCODONE-ACETAMINOPHEN 5-325 MG PO TABS: 1.0000 | ORAL_TABLET | ORAL | Status: DC | PRN

## 2015-04-23 MED ORDER — PROPOFOL 10 MG/ML IV BOLUS
INTRAVENOUS | Status: AC
  Filled 2015-04-23: qty 20

## 2015-04-23 MED ORDER — HYDROMORPHONE HCL 1 MG/ML IJ SOLN: 0.5000 mg | INTRAMUSCULAR | Status: DC | PRN

## 2015-04-23 MED ORDER — ALUM & MAG HYDROXIDE-SIMETH 200-200-20 MG/5ML PO SUSP: 30.0000 mL | Freq: Four times a day (QID) | ORAL | Status: DC | PRN

## 2015-04-23 MED ORDER — ARTIFICIAL TEARS OP OINT
TOPICAL_OINTMENT | OPHTHALMIC | Status: DC | PRN
Start: 2015-04-23 — End: 2015-04-23
  Administered 2015-04-23: 1 via OPHTHALMIC

## 2015-04-23 MED ORDER — THROMBIN 20000 UNITS EX SOLR
CUTANEOUS | Status: DC | PRN
  Administered 2015-04-23: 20 mL via TOPICAL

## 2015-04-23 MED ORDER — ACETAMINOPHEN 10 MG/ML IV SOLN
INTRAVENOUS | Status: AC
  Administered 2015-04-23: 1000 mg via INTRAVENOUS
  Filled 2015-04-23: qty 100

## 2015-04-23 MED ORDER — FENTANYL CITRATE (PF) 250 MCG/5ML IJ SOLN
INTRAMUSCULAR | Status: AC
  Filled 2015-04-23: qty 5

## 2015-04-23 MED ORDER — SUCCINYLCHOLINE CHLORIDE 20 MG/ML IJ SOLN
INTRAMUSCULAR | Status: DC | PRN
  Administered 2015-04-23: 200 mg via INTRAVENOUS

## 2015-04-23 MED ORDER — SUCCINYLCHOLINE CHLORIDE 20 MG/ML IJ SOLN
INTRAMUSCULAR | Status: AC
  Filled 2015-04-23: qty 2

## 2015-04-23 MED ORDER — FLEET ENEMA 7-19 GM/118ML RE ENEM
1.0000 | ENEMA | Freq: Once | RECTAL | Status: AC | PRN
  Filled 2015-04-23: qty 1

## 2015-04-23 MED ORDER — PHENYLEPHRINE HCL 10 MG/ML IJ SOLN
INTRAMUSCULAR | Status: AC
  Filled 2015-04-23: qty 1

## 2015-04-23 MED ORDER — BISACODYL 10 MG RE SUPP: 10.0000 mg | Freq: Every day | RECTAL | Status: DC | PRN

## 2015-04-23 MED ORDER — PANTOPRAZOLE SODIUM 40 MG IV SOLR
40.0000 mg | Freq: Every day | INTRAVENOUS | Status: DC
  Administered 2015-04-23: 40 mg via INTRAVENOUS
  Filled 2015-04-23 (×2): qty 40

## 2015-04-23 MED ORDER — BUPIVACAINE LIPOSOME 1.3 % IJ SUSP
20.0000 mL | INTRAMUSCULAR | Status: DC
  Filled 2015-04-23: qty 20

## 2015-04-23 MED ORDER — MOMETASONE FURO-FORMOTEROL FUM 100-5 MCG/ACT IN AERO
1.0000 | INHALATION_SPRAY | Freq: Every day | RESPIRATORY_TRACT | Status: DC
  Administered 2015-04-24: 1 via RESPIRATORY_TRACT
  Filled 2015-04-23 (×2): qty 8.8

## 2015-04-23 MED ORDER — DICLOFENAC SODIUM 75 MG PO TBEC
75.0000 mg | DELAYED_RELEASE_TABLET | Freq: Two times a day (BID) | ORAL | Status: DC
  Administered 2015-04-23 – 2015-04-24 (×2): 75 mg via ORAL
  Filled 2015-04-23 (×4): qty 1

## 2015-04-23 MED ORDER — HYDROMORPHONE HCL 1 MG/ML IJ SOLN
0.2500 mg | INTRAMUSCULAR | Status: DC | PRN
  Administered 2015-04-23 (×5): 0.5 mg via INTRAVENOUS

## 2015-04-23 MED ORDER — PHENYLEPHRINE 40 MCG/ML (10ML) SYRINGE FOR IV PUSH (FOR BLOOD PRESSURE SUPPORT)
PREFILLED_SYRINGE | INTRAVENOUS | Status: AC
  Filled 2015-04-23: qty 10

## 2015-04-23 MED ORDER — LIDOCAINE HCL (CARDIAC) 20 MG/ML IV SOLN
INTRAVENOUS | Status: DC | PRN
  Administered 2015-04-23: 100 mg via INTRAVENOUS

## 2015-04-23 MED ORDER — ALBUTEROL SULFATE (2.5 MG/3ML) 0.083% IN NEBU: 3.0000 mL | INHALATION_SOLUTION | Freq: Four times a day (QID) | RESPIRATORY_TRACT | Status: DC | PRN

## 2015-04-23 MED ORDER — CEFAZOLIN SODIUM-DEXTROSE 2-3 GM-% IV SOLR
2.0000 g | Freq: Three times a day (TID) | INTRAVENOUS | Status: AC
  Administered 2015-04-23 – 2015-04-24 (×2): 2 g via INTRAVENOUS
  Filled 2015-04-23 (×2): qty 50

## 2015-04-23 MED ORDER — DEXTROSE 5 % IV SOLN
500.0000 mg | Freq: Four times a day (QID) | INTRAVENOUS | Status: DC | PRN
  Administered 2015-04-23: 500 mg via INTRAVENOUS
  Filled 2015-04-23 (×2): qty 5

## 2015-04-23 MED ORDER — ROCURONIUM BROMIDE 50 MG/5ML IV SOLN
INTRAVENOUS | Status: AC
  Filled 2015-04-23: qty 2

## 2015-04-23 MED ORDER — SODIUM CHLORIDE 0.9 % IJ SOLN: 3.0000 mL | Freq: Two times a day (BID) | INTRAMUSCULAR | Status: DC

## 2015-04-23 MED ORDER — EPHEDRINE SULFATE 50 MG/ML IJ SOLN
INTRAMUSCULAR | Status: AC
  Filled 2015-04-23: qty 2

## 2015-04-23 MED ORDER — ONDANSETRON HCL 4 MG/2ML IJ SOLN: 4.0000 mg | INTRAMUSCULAR | Status: DC | PRN

## 2015-04-23 MED ORDER — LIDOCAINE HCL (CARDIAC) 20 MG/ML IV SOLN
INTRAVENOUS | Status: AC
  Filled 2015-04-23: qty 10

## 2015-04-23 MED ORDER — 0.9 % SODIUM CHLORIDE (POUR BTL) OPTIME
TOPICAL | Status: DC | PRN
  Administered 2015-04-23: 1000 mL

## 2015-04-23 MED ORDER — OXYCODONE-ACETAMINOPHEN 5-325 MG PO TABS
1.0000 | ORAL_TABLET | ORAL | Status: DC | PRN
  Administered 2015-04-24 (×2): 2 via ORAL
  Filled 2015-04-23 (×2): qty 2

## 2015-04-23 MED ORDER — ACETAMINOPHEN 650 MG RE SUPP
650.0000 mg | RECTAL | Status: DC | PRN
  Filled 2015-04-23: qty 1

## 2015-04-23 MED ORDER — SODIUM CHLORIDE 0.9 % IV SOLN: 250.0000 mL | INTRAVENOUS | Status: DC

## 2015-04-23 MED ORDER — PHENYLEPHRINE HCL 10 MG/ML IJ SOLN
INTRAMUSCULAR | Status: DC | PRN
  Administered 2015-04-23: 80 ug via INTRAVENOUS
  Administered 2015-04-23: 120 ug via INTRAVENOUS
  Administered 2015-04-23: 80 ug via INTRAVENOUS

## 2015-04-23 MED ORDER — MEPERIDINE HCL 25 MG/ML IJ SOLN: 6.2500 mg | INTRAMUSCULAR | Status: DC | PRN

## 2015-04-23 SURGICAL SUPPLY — 92 items
ADH SKN CLS APL DERMABOND .7 (GAUZE/BANDAGES/DRESSINGS) ×1
ADH SKN CLS LQ APL DERMABOND (GAUZE/BANDAGES/DRESSINGS) ×1
APL SKNCLS STERI-STRIP NONHPOA (GAUZE/BANDAGES/DRESSINGS) ×1
BENZOIN TINCTURE PRP APPL 2/3 (GAUZE/BANDAGES/DRESSINGS) ×3 IMPLANT
BIT DRILL PLIF MAS DISP 5.5MM (DRILL) IMPLANT
BLADE CLIPPER SURG (BLADE) IMPLANT
BONE MATRIX OSTEOCEL PRO MED (Bone Implant) ×2 IMPLANT
BUR MATCHSTICK NEURO 3.0 LAGG (BURR) ×3 IMPLANT
BUR ROUND FLUTED 5 RND (BURR) ×2 IMPLANT
BUR ROUND FLUTED 5MM RND (BURR) ×1
CAGE COROENT PLIF 10X28-8 LUMB (Cage) ×4 IMPLANT
CANISTER SUCT 3000ML PPV (MISCELLANEOUS) ×3 IMPLANT
CLIP NEUROVISION LG (CLIP) ×2 IMPLANT
CLOSURE WOUND 1/2 X4 (GAUZE/BANDAGES/DRESSINGS) ×1
CONT SPEC 4OZ CLIKSEAL STRL BL (MISCELLANEOUS) ×6 IMPLANT
COVER BACK TABLE 24X17X13 BIG (DRAPES) IMPLANT
COVER BACK TABLE 60X90IN (DRAPES) ×3 IMPLANT
DECANTER SPIKE VIAL GLASS SM (MISCELLANEOUS) ×3 IMPLANT
DERMABOND ADHESIVE PROPEN (GAUZE/BANDAGES/DRESSINGS) ×2
DERMABOND ADVANCED (GAUZE/BANDAGES/DRESSINGS) ×2
DERMABOND ADVANCED .7 DNX12 (GAUZE/BANDAGES/DRESSINGS) ×1 IMPLANT
DERMABOND ADVANCED .7 DNX6 (GAUZE/BANDAGES/DRESSINGS) IMPLANT
DRAPE C-ARM 42X72 X-RAY (DRAPES) ×3 IMPLANT
DRAPE C-ARMOR (DRAPES) ×3 IMPLANT
DRAPE LAPAROTOMY 100X72X124 (DRAPES) ×3 IMPLANT
DRAPE POUCH INSTRU U-SHP 10X18 (DRAPES) ×3 IMPLANT
DRAPE SURG 17X23 STRL (DRAPES) ×3 IMPLANT
DRILL PLIF MAS DISP 5.5MM (DRILL) ×3
DRSG OPSITE POSTOP 4X6 (GAUZE/BANDAGES/DRESSINGS) ×2 IMPLANT
DRSG TELFA 3X8 NADH (GAUZE/BANDAGES/DRESSINGS) ×3 IMPLANT
DURAPREP 26ML APPLICATOR (WOUND CARE) ×3 IMPLANT
ELECT BLADE 4.0 EZ CLEAN MEGAD (MISCELLANEOUS) ×3
ELECT REM PT RETURN 9FT ADLT (ELECTROSURGICAL) ×3
ELECTRODE BLDE 4.0 EZ CLN MEGD (MISCELLANEOUS) IMPLANT
ELECTRODE REM PT RTRN 9FT ADLT (ELECTROSURGICAL) ×1 IMPLANT
EVACUATOR 1/8 PVC DRAIN (DRAIN) ×3 IMPLANT
GAUZE SPONGE 4X4 12PLY STRL (GAUZE/BANDAGES/DRESSINGS) ×3 IMPLANT
GAUZE SPONGE 4X4 16PLY XRAY LF (GAUZE/BANDAGES/DRESSINGS) IMPLANT
GLOVE BIO SURGEON STRL SZ8 (GLOVE) ×5 IMPLANT
GLOVE BIOGEL PI IND STRL 8 (GLOVE) ×1 IMPLANT
GLOVE BIOGEL PI IND STRL 8.5 (GLOVE) ×1 IMPLANT
GLOVE BIOGEL PI INDICATOR 8 (GLOVE) ×4
GLOVE BIOGEL PI INDICATOR 8.5 (GLOVE) ×4
GLOVE ECLIPSE 8.0 STRL XLNG CF (GLOVE) ×5 IMPLANT
GLOVE EXAM NITRILE LRG STRL (GLOVE) IMPLANT
GLOVE EXAM NITRILE MD LF STRL (GLOVE) IMPLANT
GLOVE EXAM NITRILE XL STR (GLOVE) IMPLANT
GLOVE EXAM NITRILE XS STR PU (GLOVE) IMPLANT
GOWN STRL REUS W/ TWL LRG LVL3 (GOWN DISPOSABLE) IMPLANT
GOWN STRL REUS W/ TWL XL LVL3 (GOWN DISPOSABLE) ×1 IMPLANT
GOWN STRL REUS W/TWL 2XL LVL3 (GOWN DISPOSABLE) ×5 IMPLANT
GOWN STRL REUS W/TWL LRG LVL3 (GOWN DISPOSABLE) ×9
GOWN STRL REUS W/TWL XL LVL3 (GOWN DISPOSABLE) ×6
KIT BASIN OR (CUSTOM PROCEDURE TRAY) ×3 IMPLANT
KIT NDL NVM5 EMG ELECT (KITS) IMPLANT
KIT NEEDLE NVM5 EMG ELECT (KITS) ×1 IMPLANT
KIT NEEDLE NVM5 EMG ELECTRODE (KITS) ×2
KIT POSITION SURG JACKSON T1 (MISCELLANEOUS) ×3 IMPLANT
KIT ROOM TURNOVER OR (KITS) ×3 IMPLANT
MILL MEDIUM DISP (BLADE) ×3 IMPLANT
NDL HYPO 25X1 1.5 SAFETY (NEEDLE) ×1 IMPLANT
NDL SPNL 18GX3.5 QUINCKE PK (NEEDLE) IMPLANT
NEEDLE HYPO 25X1 1.5 SAFETY (NEEDLE) ×3 IMPLANT
NEEDLE SPNL 18GX3.5 QUINCKE PK (NEEDLE) IMPLANT
NS IRRIG 1000ML POUR BTL (IV SOLUTION) ×3 IMPLANT
PACK LAMINECTOMY NEURO (CUSTOM PROCEDURE TRAY) ×3 IMPLANT
PAD ARMBOARD 7.5X6 YLW CONV (MISCELLANEOUS) ×9 IMPLANT
PAD DRESSING TELFA 3X8 NADH (GAUZE/BANDAGES/DRESSINGS) ×1 IMPLANT
PATTIES SURGICAL .5 X.5 (GAUZE/BANDAGES/DRESSINGS) IMPLANT
PATTIES SURGICAL .5 X1 (DISPOSABLE) IMPLANT
PATTIES SURGICAL 1X1 (DISPOSABLE) IMPLANT
ROD 35MM (Rod) ×4 IMPLANT
SCREW LOCK (Screw) ×12 IMPLANT
SCREW LOCK FXNS SPNE MAS PL (Screw) IMPLANT
SCREW PLIF MAS 5.5X35 LUMBAR (Screw) ×4 IMPLANT
SCREW SHANKS 5.5X35 (Screw) ×4 IMPLANT
SCREW TULIP 5.5 (Screw) ×4 IMPLANT
SPONGE LAP 4X18 X RAY DECT (DISPOSABLE) IMPLANT
SPONGE SURGIFOAM ABS GEL 100 (HEMOSTASIS) ×3 IMPLANT
STAPLER SKIN PROX WIDE 3.9 (STAPLE) IMPLANT
STRIP CLOSURE SKIN 1/2X4 (GAUZE/BANDAGES/DRESSINGS) ×2 IMPLANT
SUT VIC AB 1 CT1 18XBRD ANBCTR (SUTURE) ×2 IMPLANT
SUT VIC AB 1 CT1 8-18 (SUTURE) ×6
SUT VIC AB 2-0 CT1 18 (SUTURE) ×6 IMPLANT
SUT VIC AB 3-0 SH 8-18 (SUTURE) ×6 IMPLANT
SYR 20ML ECCENTRIC (SYRINGE) ×3 IMPLANT
SYR 5ML LL (SYRINGE) IMPLANT
TOWEL OR 17X24 6PK STRL BLUE (TOWEL DISPOSABLE) ×3 IMPLANT
TOWEL OR 17X26 10 PK STRL BLUE (TOWEL DISPOSABLE) ×3 IMPLANT
TRAP SPECIMEN MUCOUS 40CC (MISCELLANEOUS) ×3 IMPLANT
TRAY FOLEY W/METER SILVER 14FR (SET/KITS/TRAYS/PACK) ×3 IMPLANT
WATER STERILE IRR 1000ML POUR (IV SOLUTION) ×3 IMPLANT

## 2015-04-23 NOTE — H&P (Signed)
Patient ID:   [id number] Patient: Don [name]  Date of Birth: Apr 20, 1966 Visit Type: Office Visit   Date: 02/11/2015 03:00 PM Provider: Danae Orleans. Venetia Maxon MD   This 49 year old male presents for back pain.  History of Present Illness: 1.  back pain  Patient's MRI was reviewed.  This demonstrates worsening of disc herniation and caudally migrated fragment with spondylosis and partial facetectomy on the right at the L4-L5 level.  Because of the worsening of his recurrent disc herniation and degeneration at this level, I recommended the patient undergo redo laminectomy at L4 L5 along with MAS PLIF procedure.  I am concerned that the risk of destabilizing the spine is quite high and if we do not do this procedure think the likelihood of his requiring additional surgery is fairly significant.  It does not help that he is 281 pounds.      Medical/Surgical/Interim History Reviewed, no change.  Last detailed document date:01/01/2014.   PAST MEDICAL HISTORY, SURGICAL HISTORY, FAMILY HISTORY, SOCIAL HISTORY AND REVIEW OF SYSTEMS I have reviewed the patient's past medical, surgical, family and social history as well as the comprehensive review of systems as included on the [facility] history form dated 08/27/2014, which I have signed.  Family History: Reviewed, no changes.  Last detailed document: 01/01/2014.   Social History: Tobacco use reviewed. Reviewed, no changes. Last detailed document date: 01/01/2014.      MEDICATIONS(added, continued or stopped this visit): Started Medication Directions Instruction Stopped   Advair Diskus 250 mcg-50 mcg/dose powder for inhalation inhale 1 puff by inhalation route 2 times every day in the morning and evening approximately 12 hours apart     diclofenac 75 mg ORAL     12/17/2014 DICLOFENAC SOD DR 75 MG TAB TAKE 1 TABLET BY MOUTH TWICE A DAY     hydrochlorothiazide 25 mg tablet     04/30/2014 Valium 10 mg tablet  take 1  tab PO prior to procedure       ALLERGIES: Ingredient Reaction Medication Name Comment  LISINOPRIL fatigue & body pain     Reviewed, no changes.    Vitals Date Temp F BP Pulse Ht In Wt Lb BMI BSA Pain Score  02/11/2015  123/76 75 69 281 41.5  5/10      IMPRESSION Patient has significant recurrent disc herniation L4 L5 on the right radius marked spondylosis and degeneration at this level.  I recommended redo discectomy along with fusion at this level.  Assessment/Plan # Detail Type Description   1. Assessment Displacement of lumbar intervertebral disc without myelopathy (M51.26).       2. Assessment Radiculopathy, lumbar region (M54.16).       3. 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       4. Assessment Body mass index (BMI) 40.0-44.9, adult (Z68.41).         Pain Assessment/Treatment Location: back. Onset: 08/03/2012. Duration: varies. Quality: discomforting. Pain Assessment/Treatment follow-up plan of care: Patient is taking medications as prescribed..  Risks and benefits were discussed in detail with patient and wished to proceed with surgery.             Provider:  Danae Orleans. Venetia Maxon MD  02/12/2015 05:50 PM Dictation edited by: Danae Orleans. Venetia Maxon    CC Providers: Maeola Harman MD [address] [place], [place] [address]              Electronically signed by Danae Orleans. Venetia Maxon MD on 02/12/2015 05:50 PM

## 2015-04-23 NOTE — Anesthesia Procedure Notes (Signed)
Procedure Name: Intubation Date/Time: 04/23/2015 10:27 AM Performed by: Carmela Rima Pre-anesthesia Checklist: Patient being monitored, Suction available, Emergency Drugs available, Patient identified and Timeout performed Patient Re-evaluated:Patient Re-evaluated prior to inductionOxygen Delivery Method: Circle system utilized Preoxygenation: Pre-oxygenation with 100% oxygen Intubation Type: IV induction and Rapid sequence Laryngoscope Size: Mac and 3 Grade View: Grade I Tube type: Oral Tube size: 7.5 mm Number of attempts: 1 Placement Confirmation: positive ETCO2,  ETT inserted through vocal cords under direct vision and breath sounds checked- equal and bilateral Secured at: 23 cm Tube secured with: Tape Dental Injury: Teeth and Oropharynx as per pre-operative assessment

## 2015-04-23 NOTE — Interval H&P Note (Signed)
History and Physical Interval Note:  04/23/2015 10:23 AM  [name]  has presented today for surgery, with the diagnosis of Displacement of lumbar intervertebral disc without myelopathy  The various methods of treatment have been discussed with the patient and family. After consideration of risks, benefits and other options for treatment, the patient has consented to  Procedure(s) with comments: L4-5 Maximum access posterior lumbar interbody fusion (N/A) - L4-5 Maximum access posterior lumbar interbody fusion as a surgical intervention .  The patient's history has been reviewed, patient examined, no change in status, stable for surgery.  I have reviewed the patient's chart and labs.  Questions were answered to the patient's satisfaction.     Don [name] D

## 2015-04-23 NOTE — Anesthesia Preprocedure Evaluation (Addendum)
Anesthesia Evaluation  Patient identified by MRN, date of birth, ID band Patient awake    Reviewed: Allergy & Precautions, NPO status , Patient's Chart, lab work & pertinent test results, reviewed documented beta blocker date and time   Airway Mallampati: I  TM Distance: >3 FB Neck ROM: Full    Dental  (+) Teeth Intact, Dental Advidsory Given   Pulmonary asthma , former smoker,    Pulmonary exam normal       Cardiovascular hypertension, On Medications and Pt. on medications Normal cardiovascular examRhythm:regular     Neuro/Psych PSYCHIATRIC DISORDERS    GI/Hepatic   Endo/Other    Renal/GU      Musculoskeletal   Abdominal   Peds  Hematology   Anesthesia Other Findings   Reproductive/Obstetrics                            Anesthesia Physical Anesthesia Plan  ASA: III  Anesthesia Plan: General   Post-op Pain Management:    Induction: Intravenous  Airway Management Planned: Oral ETT  Additional Equipment:   Intra-op Plan:   Post-operative Plan: Extubation in OR  Informed Consent: I have reviewed the patients History and Physical, chart, labs and discussed the procedure including the risks, benefits and alternatives for the proposed anesthesia with the patient or authorized representative who has indicated his/her understanding and acceptance.   Dental Advisory Given  Plan Discussed with: CRNA and Surgeon  Anesthesia Plan Comments:        Anesthesia Quick Evaluation

## 2015-04-23 NOTE — Transfer of Care (Signed)
Immediate Anesthesia Transfer of Care Note  Patient: Don [name]  Procedure(s) Performed: Procedure(s) with comments: L4-5 Maximum access posterior lumbar interbody fusion (N/A) - L4-5 Maximum access posterior lumbar interbody fusion  Patient Location: PACU  Anesthesia Type:General  Level of Consciousness: awake, alert  and oriented  Airway & Oxygen Therapy: Patient Spontanous Breathing and Patient connected to nasal cannula oxygen  Post-op Assessment: Report given to RN, Post -op Vital signs reviewed and stable and Patient moving all extremities X 4  Post vital signs: Reviewed and stable  Last Vitals:  Filed Vitals:   04/23/15 0831  BP: 138/83  Pulse: 74  Temp: 36.5 C  Resp: 20    Complications: No apparent anesthesia complications

## 2015-04-23 NOTE — Progress Notes (Signed)
Pt still coing of pain ,spoke with Dr Michelle Piper, orders noted

## 2015-04-23 NOTE — Anesthesia Postprocedure Evaluation (Signed)
Anesthesia Post Note  Patient: Don [name]  Procedure(s) Performed: Procedure(s) (LRB): L4-5 Maximum access posterior lumbar interbody fusion (N/A)  Anesthesia type: general  Patient location: PACU  Post pain: Pain level controlled  Post assessment: Patient's Cardiovascular Status Stable  Last Vitals:  Filed Vitals:   04/23/15 1630  BP:   Pulse: 77  Temp:   Resp: 19    Post vital signs: Reviewed and stable  Level of consciousness: sedated  Complications: No apparent anesthesia complications

## 2015-04-23 NOTE — Brief Op Note (Signed)
04/23/2015  2:16 PM  PATIENT:  Don [name]  49 y.o. 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, spondylosis, radiculopathy, lumbago, morbid obesity   PROCEDURE:  Procedure(s) with comments: L4-5 Maximum access posterior lumbar interbody fusion (N/A) - L4-5 Maximum access posterior lumbar interbody fusion with Redo Discectomy  Decompression greater than standard PLIF procedure  SURGEON:  Surgeon(s) and Role:    * Mailey Landstrom, MD - Primary    * Neelesh Nundkumar, MD - Assisting  PHYSICIAN ASSISTANT:   ASSISTANTS: Poteat, RN   ANESTHESIA:   general  EBL:  Total I/O In: 1800 [I.V.:1800] Out: 350 [Urine:200; Blood:150]  BLOOD ADMINISTERED:none  DRAINS: none   LOCAL MEDICATIONS USED:  MARCAINE    and LIDOCAINE   SPECIMEN:  No Specimen  DISPOSITION OF SPECIMEN:  N/A  COUNTS:  YES  TOURNIQUET:  * No tourniquets in log *  DICTATION: Patient is a 49-year-old with spondylosis , stenosis, second recurrent disc herniation and severe back and right lower extremity pain at L4/5 levels of the lumbar spine. It was elected to take him to surgery for MASPLIF L 45 level with posterolateral arthrodesis. He is morbidly obese.  Procedure:   Following uncomplicated induction of GETA, and placement of electrodes for neural monitoring, patient was turned into a prone position on the [place] tableand using AP  fluoroscopy the area of planned incision was marked, prepped with betadine scrub and Duraprep, then draped. Exposure was performed of facet joint complex at L 45 level and the MAS retractor was placed.5.5 x 35 mm cortical Nuvasive screws were placed at L 4 bilaterally according to standard landmarks using neural monitoring.  A total laminectomy of L 4 was then  performed with disarticulation of facets.  This bone was saved for grafting, combined with Osteocel after being run through bone mill and was placed in bone packing device.  Thorough discectomy was performed bilaterally at L 45 and the endplates were prepared for grafting.  This included painstaking decompression and redo discectomy on the right with removal of recurrent herniated disc fragments causing significant neural compression. This involved thorough decompression of the L 4, L 5 nerve roots anf thecal sac.   Decompression was greater than for standard PLIF procedure.  23 x 10 x 8 degree cages were placed in the interspace and positioning was confirmed with AP and lateral fluoroscopy.  10 cc of autograft/Osteocel was packed in the interspace medial to the second cage.   Remaining screws were placed at L 5 and 35 mm rods were placed.   And the screws were locked and torqued.Final Xrays showed well positioned implants and screw fixation. The posterolateral region was packed with remaining 10 cc of autograft on the left of midline. The wounds were irrigated and then closed with 1, 2-0 and 3-0 Vicryl stitches. Deep muscular tissues were infiltrated with long-acting Marcaine. Sterile occlusive dressing was placed with Dermabond. The patient was then extubated in the operating room and taken to recovery in stable and satisfactory condition having tolerated her operation well. Counts were correct at the end of the case.  PLAN OF CARE: Admit to inpatient   PATIENT DISPOSITION:  PACU - hemodynamically stable.   Delay start of Pharmacological VTE agent (>24hrs) due to surgical blood loss or risk of bleeding: yes  

## 2015-04-23 NOTE — Op Note (Signed)
04/23/2015  2:16 PM  PATIENT:  Don [name]  49 y.o. 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, spondylosis, radiculopathy, lumbago, morbid obesity   PROCEDURE:  Procedure(s) with comments: L4-5 Maximum access posterior lumbar interbody fusion (N/A) - L4-5 Maximum access posterior lumbar interbody fusion with Redo Discectomy  Decompression greater than standard PLIF procedure  SURGEON:  Surgeon(s) and Role:    * Maeola Harman, MD - Primary    * Lisbeth Renshaw, MD - Assisting  PHYSICIAN ASSISTANT:   ASSISTANTS: Poteat, RN   ANESTHESIA:   general  EBL:  Total I/O In: 1800 [I.V.:1800] Out: 350 [Urine:200; Blood:150]  BLOOD ADMINISTERED:none  DRAINS: none   LOCAL MEDICATIONS USED:  MARCAINE    and LIDOCAINE   SPECIMEN:  No Specimen  DISPOSITION OF SPECIMEN:  N/A  COUNTS:  YES  TOURNIQUET:  * No tourniquets in log *  DICTATION: Patient is a 49 year old with spondylosis , stenosis, second recurrent disc herniation and severe back and right lower extremity pain at L4/5 levels of the lumbar spine. It was elected to take him to surgery for MASPLIF L 45 level with posterolateral arthrodesis. He is morbidly obese.  Procedure:   Following uncomplicated induction of GETA, and placement of electrodes for neural monitoring, patient was turned into a prone position on the [place] tableand using AP  fluoroscopy the area of planned incision was marked, prepped with betadine scrub and Duraprep, then draped. Exposure was performed of facet joint complex at L 45 level and the MAS retractor was placed.5.5 x 35 mm cortical Nuvasive screws were placed at L 4 bilaterally according to standard landmarks using neural monitoring.  A total laminectomy of L 4 was then  performed with disarticulation of facets.  This bone was saved for grafting, combined with Osteocel after being run through bone mill and was placed in bone packing device.  Thorough discectomy was performed bilaterally at L 45 and the endplates were prepared for grafting.  This included painstaking decompression and redo discectomy on the right with removal of recurrent herniated disc fragments causing significant neural compression. This involved thorough decompression of the L 4, L 5 nerve roots anf thecal sac.   Decompression was greater than for standard PLIF procedure.  23 x 10 x 8 degree cages were placed in the interspace and positioning was confirmed with AP and lateral fluoroscopy.  10 cc of autograft/Osteocel was packed in the interspace medial to the second cage.   Remaining screws were placed at L 5 and 35 mm rods were placed.   And the screws were locked and torqued.Final Xrays showed well positioned implants and screw fixation. The posterolateral region was packed with remaining 10 cc of autograft on the left of midline. The wounds were irrigated and then closed with 1, 2-0 and 3-0 Vicryl stitches. Deep muscular tissues were infiltrated with long-acting Marcaine. Sterile occlusive dressing was placed with Dermabond. The patient was then extubated in the operating room and taken to recovery in stable and satisfactory condition having tolerated her operation well. Counts were correct at the end of the case.  PLAN OF CARE: Admit to inpatient   PATIENT DISPOSITION:  PACU - hemodynamically stable.   Delay start of Pharmacological VTE agent (>24hrs) due to surgical blood loss or risk of bleeding: yes

## 2015-04-23 NOTE — Progress Notes (Signed)
Awake, alert, conversant.  MAEW with good strength.  Doing well. 

## 2015-04-24 ENCOUNTER — Encounter (HOSPITAL_COMMUNITY): Payer: Self-pay | Admitting: Neurosurgery

## 2015-04-24 MED ORDER — PANTOPRAZOLE SODIUM 40 MG PO TBEC: 40.0000 mg | DELAYED_RELEASE_TABLET | Freq: Every day | ORAL | Status: DC
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
## 2015-04-24 NOTE — Evaluation (Signed)
Physical Therapy Evaluation Patient Details Name: Don [name] MRN: [id number] DOB: [birth year] Today's Date: 04/24/2015   History of Present Illness  Pt is a 49 y/o male admitted s/p elective PLIF L4-5 on 04/23/15.  Clinical Impression  Pt admitted with above diagnosis. Pt currently with functional limitations due to the deficits listed below (see PT Problem List). At the time of PT eval pt was able to perform transfers and ambulation with close guard for safety. Pt has had back surgery in the past and states he knows his precautions, however needs frequent cueing during session to maintain precautions. Would benefit from further education. Pt will benefit from skilled PT to increase their independence and safety with mobility to allow discharge to the venue listed below.       Follow Up Recommendations Outpatient PT (When appropriate per post-op protocol)    Equipment Recommendations  Cane;Other (comment) (tub bench/shower seat per OT recommendation)    Recommendations for Other Services       Precautions / Restrictions Precautions Precautions: Fall;Back Precaution Booklet Issued: Yes (comment) Precaution Comments: Reviewed back precautions with pt and wife Required Braces or Orthoses: Spinal Brace Spinal Brace: Lumbar corset;Applied in sitting position Restrictions Weight Bearing Restrictions: No      Mobility  Bed Mobility Overal bed mobility: Needs Assistance Bed Mobility: Rolling;Sidelying to Sit Rolling: Supervision Sidelying to sit: Supervision       General bed mobility comments: Supervision for safety. Pt able to transition to full sitting position without assistance.   Transfers Overall transfer level: Needs assistance Equipment used: None Transfers: Sit to/from Stand Sit to Stand: Min guard         General transfer comment: Pt was cued to scoot all the way to EOB. Height was raised to simulate home environment. Hands-on assist provided for safety but no  real assistance required to achieve full standing.   Ambulation/Gait Ambulation/Gait assistance: Supervision;Min guard Ambulation Distance (Feet): 350 Feet Assistive device: None Gait Pattern/deviations: Step-through pattern;Decreased stride length Gait velocity: Decreased Gait velocity interpretation: Below normal speed for age/gender General Gait Details: Pt was able to ambulate fairly well with supervision and occasional min guard assist for unsteadiness. No physical assistance required for pt to recover.   Stairs Stairs: Yes Stairs assistance: Min guard Stair Management: No rails;Forwards Number of Stairs: 4 General stair comments: Pt was cued for sequencing due to RLE weakness. Instructed wife in proper guarding techniques for safety.   Wheelchair Mobility    Modified Rankin (Stroke Patients Only)       Balance Overall balance assessment: Needs assistance Sitting-balance support: Feet supported;No upper extremity supported Sitting balance-Leahy Scale: Good     Standing balance support: No upper extremity supported;During functional activity Standing balance-Leahy Scale: Fair                               Pertinent Vitals/Pain Pain Assessment: Faces Faces Pain Scale: Hurts little more Pain Location: Burning/itching at incisional site Pain Descriptors / Indicators: Burning;Operative site guarding Pain Intervention(s): Limited activity within patient's tolerance;Monitored during session;Repositioned    Home Living Family/patient expects to be discharged to:: Private residence Living Arrangements: Spouse/significant other Available Help at Discharge: Family Type of Home: House Home Access: Stairs to enter Entrance Stairs-Rails: None Entrance Stairs-Number of Steps: 3 Home Layout: Two level Home Equipment: Hand held shower head      Prior Function Level of Independence: Independent  Hand Dominance   Dominant Hand: Right     Extremity/Trunk Assessment   Upper Extremity Assessment: Defer to OT evaluation;Overall [facility] for tasks assessed           Lower Extremity Assessment: RLE deficits/detail RLE Deficits / Details: Residual weakness from prior N/T before surgery. Pt states no numbness during session but decreased strength remains    Cervical / Trunk Assessment: Normal  Communication   Communication: No difficulties  Cognition Arousal/Alertness: Awake/alert Behavior During Therapy: WFL for tasks assessed/performed Overall Cognitive Status: Within Functional Limits for tasks assessed                      General Comments      Exercises        Assessment/Plan    PT Assessment Patient needs continued PT services  PT Diagnosis Difficulty walking;Acute pain   PT Problem List Decreased strength;Decreased range of motion;Decreased activity tolerance;Decreased balance;Decreased mobility;Decreased knowledge of use of DME;Decreased knowledge of precautions;Cardiopulmonary status limiting activity;Decreased safety awareness;Pain  PT Treatment Interventions DME instruction;Gait training;Stair training;Functional mobility training;Therapeutic activities;Therapeutic exercise;Neuromuscular re-education;Patient/family education   PT Goals (Current goals can be found in the Care Plan section) Acute Rehab PT Goals Patient Stated Goal: Return home today PT Goal Formulation: With patient/family Time For Goal Achievement: 05/01/15 Potential to Achieve Goals: Good    Frequency Min 5X/week   Barriers to discharge        Co-evaluation               End of Session Equipment Utilized During Treatment: Back brace Activity Tolerance: Patient tolerated treatment well Patient left: in chair;with call bell/phone within reach;with family/visitor present Nurse Communication: Mobility status         Time: 1610-9604 PT Time Calculation (min) (ACUTE ONLY): 32 min   Charges:   PT  Evaluation $Initial PT Evaluation Tier I: 1 Procedure PT Treatments $Gait Training: 8-22 mins   PT G Codes:        Conni Slipper 2015/05/19, 9:51 AM   Conni Slipper, PT, DPT Acute Rehabilitation Services Pager: [phone]

## 2015-04-24 NOTE — Progress Notes (Signed)
Pt doing well. Pt given D/C instructions with verbal understanding. Pt is going to Dr. Fredrich Birks office to pick up Rx's per Arlys John, RN. Pt's IV was removed prior to D/C. Pt's incision is clean and dry with no sign of infection. Pt received cane and 3-n-1 from [facility] prior to D/C. Pt D/C'd home via walking @ 1605 per MD order. Pt is stable @ D/C and has no other needs at this time. Rema Fendt, RN

## 2015-04-24 NOTE — Progress Notes (Signed)
Subjective: Patient reports "My big toe is not numb anymore & my leg doesn't hurt"  Objective: Vital signs in last 24 hours: Temp:  [97 F (36.1 C)-99.2 F (37.3 C)] 98.4 F (36.9 C) (07/27 0744) Pulse Rate:  [69-86] 69 (07/27 0744) Resp:  [11-20] 18 (07/27 0744) BP: (101-148)/(50-114) 113/76 mmHg (07/27 0744) SpO2:  [94 %-100 %] 97 % (07/27 0744) Weight:  [124.739 kg (275 lb)] 124.739 kg (275 lb) (07/26 0925)  Intake/Output from previous day: 07/26 0701 - 07/27 0700 In: 1800 [I.V.:1800] Out: 2025 [Urine:1875; Blood:150] Intake/Output this shift:    Alert, conversant, smiling. Wife present. Good strength BLE. Incision without erythema, swelling, or drainage. Dermabond beneath honeycomb drsg. Lumbar pain as expected.   Lab Results: No results for input(s): WBC, HGB, HCT, PLT in the last 72 hours. BMET No results for input(s): NA, K, CL, CO2, GLUCOSE, BUN, CREATININE, CALCIUM in the last 72 hours.  Studies/Results: Dg Lumbar Spine 2-3 Views  04/23/2015   CLINICAL DATA:  L4-5 posterior fusion for disc extrusion.  EXAM: LUMBAR SPINE - 2-3 VIEW; DG C-ARM 61-120 MIN  COMPARISON:  Lumbar spine MR dated 02/05/2015.  FINDINGS: Two frontal and 1 lateral C-arm image of the lumbosacral region were obtained. These demonstrate interbody bone plug and pedicle screw placement at the L4-5 level. Normal alignment.  IMPRESSION: Operative changes, as described above.   Electronically Signed   By: Beckie Salts M.D.   On: 04/23/2015 13:47   Dg C-arm 1-60 Min  04/23/2015   CLINICAL DATA:  L4-5 posterior fusion for disc extrusion.  EXAM: LUMBAR SPINE - 2-3 VIEW; DG C-ARM 61-120 MIN  COMPARISON:  Lumbar spine MR dated 02/05/2015.  FINDINGS: Two frontal and 1 lateral C-arm image of the lumbosacral region were obtained. These demonstrate interbody bone plug and pedicle screw placement at the L4-5 level. Normal alignment.  IMPRESSION: Operative changes, as described above.   Electronically Signed   By: Beckie Salts M.D.   On: 04/23/2015 13:47    Assessment/Plan: Improving   LOS: 1 day  Will work with PT this morning, wearing LSO when OOB. Pt & wife verbalize understanding of discharge instructions.   Georgiann Cocker 04/24/2015, 7:54 AM

## 2015-04-24 NOTE — Progress Notes (Signed)
Occupational Therapy Evaluation Patient Details Name: Don [name] MRN: [id number] DOB: 09-11-1966 Today's Date: 04/24/2015    History of Present Illness Pt is a 49 y/o male admitted s/p elective PLIF L4-5 on 04/23/15.   Clinical Impression   Completed all education regarding back precautions for ADL with use of DME and AE. Pt/wife verbalized understandning. Given handout. Pt ready to D/C home when medically stable.     Follow Up Recommendations  No OT follow up;Supervision - Intermittent    Equipment Recommendations  3 in 1 bedside comode    Recommendations for Other Services       Precautions / Restrictions Precautions Precautions: Fall;Back Precaution Booklet Issued: Yes (comment) Precaution Comments: Reviewed back precautions with pt and wife Required Braces or Orthoses: Spinal Brace Spinal Brace: Lumbar corset;Applied in sitting position Restrictions Weight Bearing Restrictions: No      Mobility Bed Mobility       General bed mobility comments: Pt up in chair  Transfers Overall transfer level: Needs assistance   S. vc to maintain posture for back precautions           Balance Overall balance assessment: Needs assistance Sitting-balance support: Feet supported;No upper extremity supported Sitting balance-Leahy Scale: Good     Standing balance support: No upper extremity supported;During functional activity Standing balance-Leahy Scale: Fair                              ADL Overall ADL's : Needs assistance/impaired     Grooming: Set up;Supervision/safety;Standing   Upper Body Bathing: Set up;Sitting;Supervision/ safety   Lower Body Bathing: Moderate assistance;Sit to/from stand   Upper Body Dressing : Set up;Supervision/safety;Sitting   Lower Body Dressing: Moderate assistance;Sit to/from stand   Toilet Transfer: Supervision/safety;Ambulation   Toileting- Clothing Manipulation and Hygiene: Moderate assistance   Tub/ Shower  Transfer: Supervision/safety   Functional mobility during ADLs: Supervision/safety General ADL Comments: Completed education regarding adhering to back precautions during ADL and funcitonal mobility with AE and DME. Pt would benefit from long handled sponge, reacher and toilet tongs. Also recommended using 3 in 1 in shower/tub and over toilet to have rails if needed to help with sit - stand. Ediucated pt/family on proper home set up to maximize safety and independence and reduce risk of falls. Pt/wife verbalized understanding.      Vision     Perception     Praxis      Pertinent Vitals/Pain Pain Assessment: Faces Faces Pain Scale: Hurts little more Pain Location: back Pain Descriptors / Indicators: Aching;Burning Pain Intervention(s): Limited activity within patient's tolerance;Monitored during session     Hand Dominance Right   Extremity/Trunk Assessment Upper Extremity Assessment Upper Extremity Assessment: Overall WFL for tasks assessed   Lower Extremity Assessment Lower Extremity Assessment: Defer to PT evaluation RLE Deficits / Details: Residual weakness from prior N/T before surgery. Pt states no numbness during session but decreased strength remains   Cervical / Trunk Assessment Cervical / Trunk Assessment: Normal   Communication Communication Communication: No difficulties   Cognition Arousal/Alertness: Awake/alert Behavior During Therapy: WFL for tasks assessed/performed Overall Cognitive Status: Within Functional Limits for tasks assessed                     General Comments   wife present for session and verbalized understanding.    Exercises       Shoulder Instructions      Home Living Family/patient expects to be  discharged to:: Private residence Living Arrangements: Spouse/significant other Available Help at Discharge: Family Type of Home: House Home Access: Stairs to enter Secretary/administrator of Steps: 3 Entrance Stairs-Rails:  None Home Layout: Two level Alternate Level Stairs-Number of Steps: 8 Alternate Level Stairs-Rails: Right Bathroom Shower/Tub: Walk-in shower;Tub/shower unit   Bathroom Toilet: Handicapped height Bathroom Accessibility: Yes How Accessible: Accessible via walker Home Equipment: Hand held shower head          Prior Functioning/Environment Level of Independence: Independent             OT Diagnosis: Generalized weakness;Acute pain   OT Problem List: Decreased range of motion;Decreased activity tolerance;Decreased knowledge of use of DME or AE;Decreased knowledge of precautions;Pain;Obesity   OT Treatment/Interventions:      OT Goals(Current goals can be found in the care plan section) Acute Rehab OT Goals Patient Stated Goal: Return home today OT Goal Formulation: All assessment and education complete, DC therapy Potential to Achieve Goals: Good  OT Frequency:     Barriers to D/C:            Co-evaluation              End of Session Equipment Utilized During Treatment: Back brace Nurse Communication: Mobility status  Activity Tolerance: Patient tolerated treatment well Patient left: in bed;with call bell/phone within reach;with family/visitor present   Time: 1610-9604 OT Time Calculation (min): 37 min Charges:  OT General Charges $OT Visit: 1 Procedure OT Evaluation $Initial OT Evaluation Tier I: 1 Procedure OT Treatments $Self Care/Home Management : 8-22 mins G-Codes:    Lacorey Brusca,HILLARY 2015-05-17, 11:15 AM   Luisa Dago, OTR/L  [phone] 2015-05-17

## 2015-04-26 NOTE — Discharge Summary (Signed)
Physician Discharge Summary  Patient ID: Don [name] MRN: [id number] DOB/AGE: February 27, 1966 49 y.o.  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  He did well after surgery and was discharged home on POD 1.  Consults: None  Significant Diagnostic Studies: None  Treatments: surgery: redo discectomy with MAS PLIF at L 45  Discharge Exam: Blood pressure 107/54, pulse 73, temperature 98.4 F (36.9 C), temperature source Oral, resp. rate 18, height  (1.753 m), weight 124.739 kg (275 lb), SpO2 96 %. Neurologic: Alert and oriented X 3, normal strength and tone. Normal symmetric reflexes. Normal coordination and gait Wound:CDI  Disposition: Home     Medication List    ASK your doctor about these medications        albuterol 108 (90 BASE) MCG/ACT inhaler  Commonly known as:  PROVENTIL HFA;VENTOLIN HFA  Inhale 2 puffs into the lungs every 6 (six) hours as needed for shortness of breath.     amLODipine 5 MG tablet  Commonly known as:  NORVASC  Take 5 mg by mouth daily.     diclofenac 75 MG EC tablet  Commonly known as:  VOLTAREN  Take 75 mg by mouth 2 (two) times daily.     Fluticasone-Salmeterol 100-50 MCG/DOSE Aepb  Commonly known as:  ADVAIR  Inhale 1 puff into the lungs daily.     hydrochlorothiazide 12.5 MG tablet  Commonly known as:  HYDRODIURIL  Take 12.5 mg by mouth daily.     sertraline 50 MG tablet  Commonly known as:  ZOLOFT  Take 50 mg by mouth daily.     valsartan 320 MG tablet  Commonly known as:  DIOVAN  Take 160 mg by mouth daily.     VITAMIN B 12 PO  Take 1 tablet by mouth daily.            Follow-up Information    Follow up with Dorian Heckle, MD.   Specialty:  Neurosurgery   Contact information:   [address] [place] [address] [phone]       Signed: Dorian Heckle, MD 04/26/2015, 5:03 PM

## 2015-07-15 ENCOUNTER — Other Ambulatory Visit: Payer: Self-pay | Admitting: *Deleted

## 2015-07-15 NOTE — Telephone Encounter (Signed)
Denied refill Ventolin Hfa faxed to [facility] [address] [phone]. Pt needs Office Visit last ov 12/11/13.

## 2016-07-12 IMAGING — MR MR LUMBAR SPINE WO/W CM
4 of 7 series · 19 of 48 positions shown · IV contrast (Yes)
Comparison: MRI 01/05/2013

CLINICAL DATA: Low back and RIGHT leg pain. Previous lumbar
surgery.

EXAM:
MRI LUMBAR SPINE WITHOUT AND WITH CONTRAST
TECHNIQUE: Multiplanar and multiecho pulse sequences of the lumbar spine were
obtained without and with intravenous contrast.
CONTRAST:  20mL MULTIHANCE GADOBENATE DIMEGLUMINE 529 MG/ML IV SOLN

[Series 2: T2 · sagittal · 4.0mm · 0.55mm/px · 3 of 13 slices shown (1 of 2)]
[im 1/13]
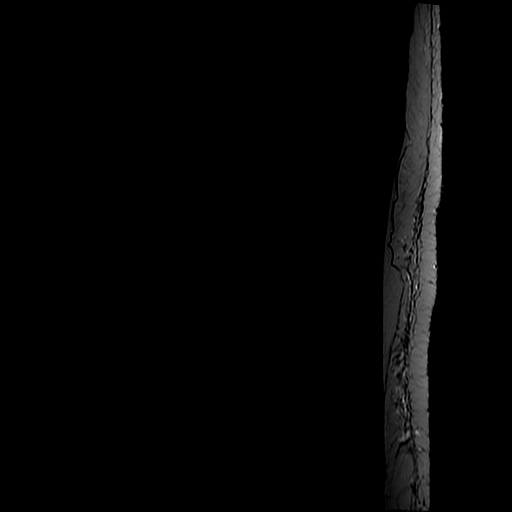
[im 7/13]
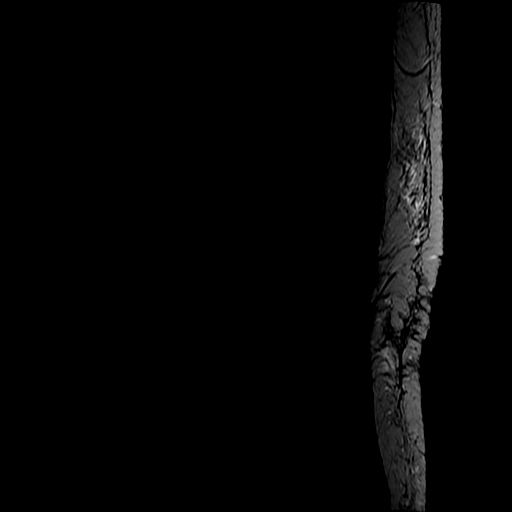
[im 13/13]
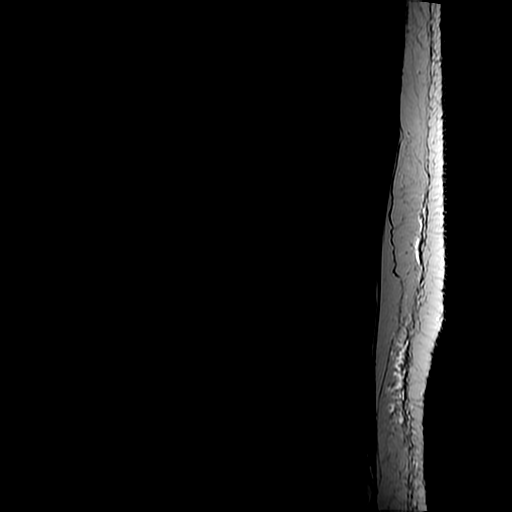

[Series 4: T1 · sagittal · 4.0mm · 0.55mm/px · 3 of 13 slices shown (1 of 2)]
[im 1/13]
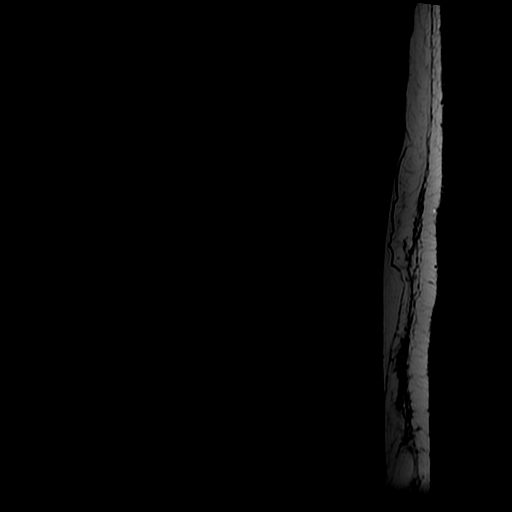
[im 9/13]
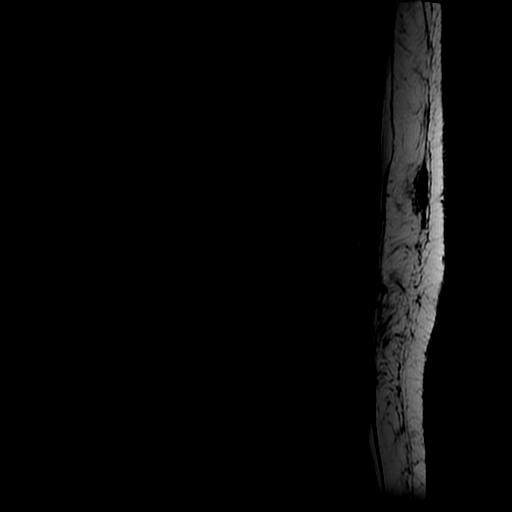
[im 13/13]
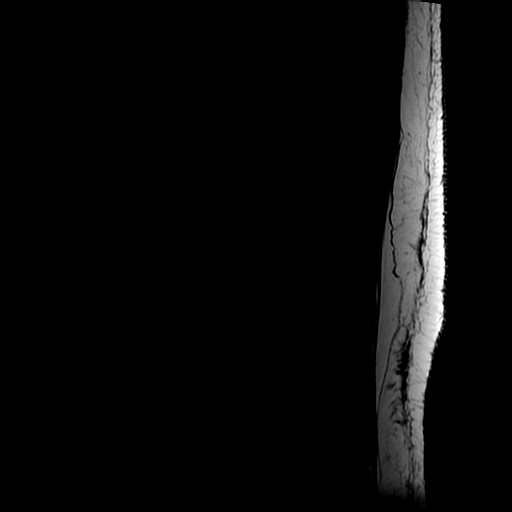

[Series 5: T2 · axial · 4.0mm · 0.39mm/px · z∈[-30,+165]mm · 10 of 40 slices shown (2 of 2)]
[im 1/40]
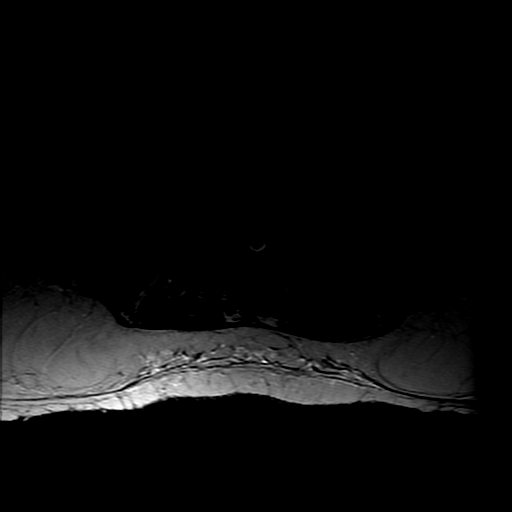
[im 4/40]
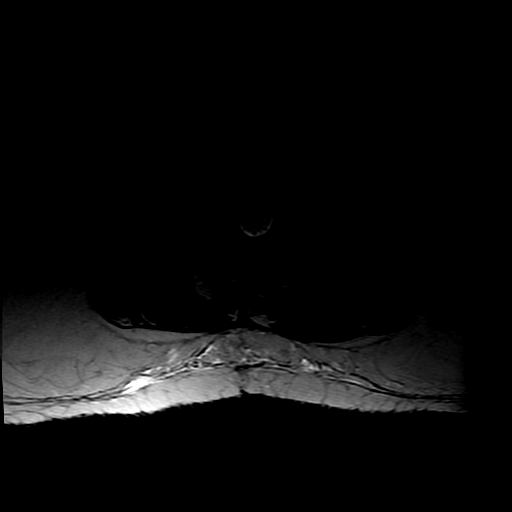
[im 8/40]
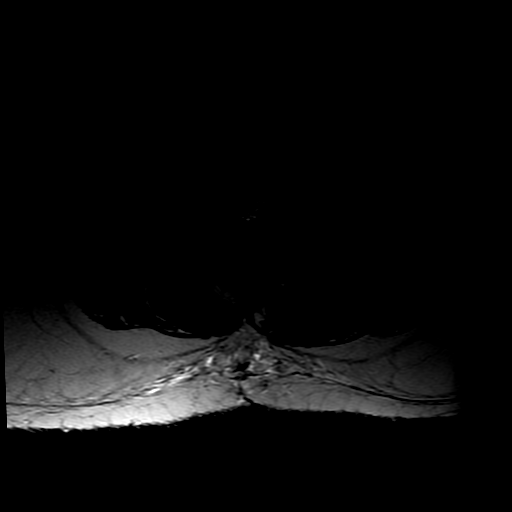
[im 12/40]
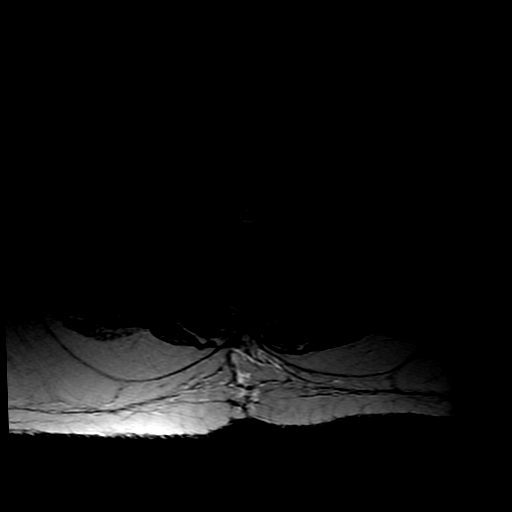
[im 16/40]
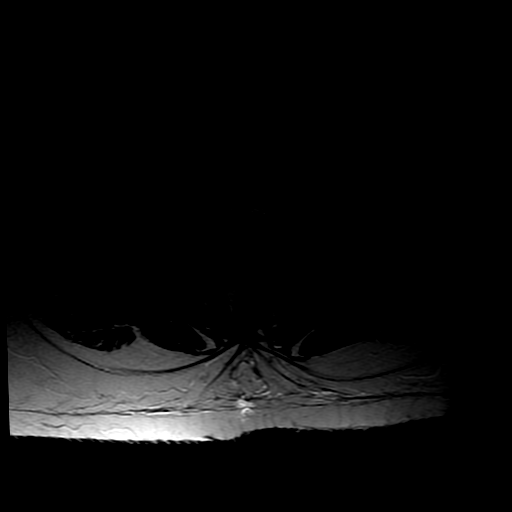
[im 20/40]
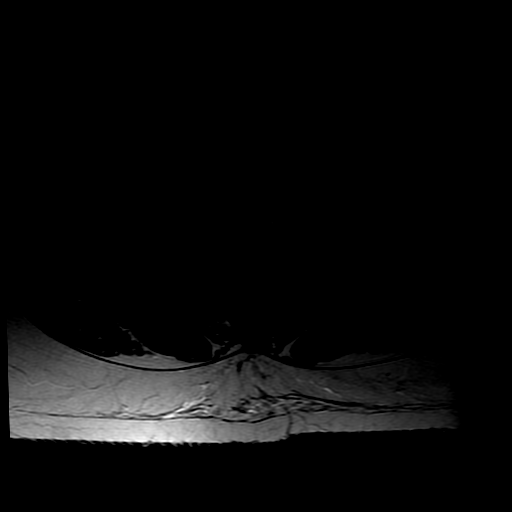
[im 24/40]
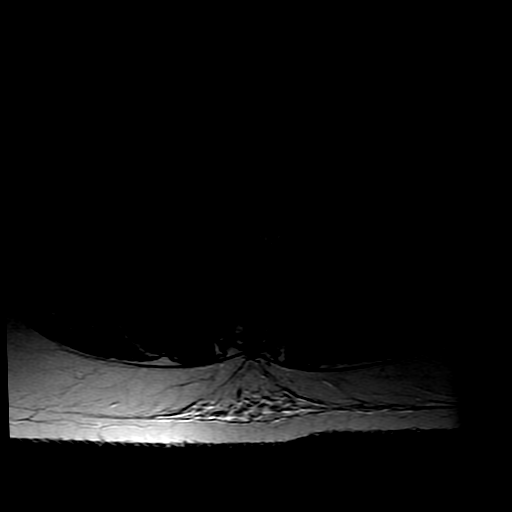
[im 28/40]
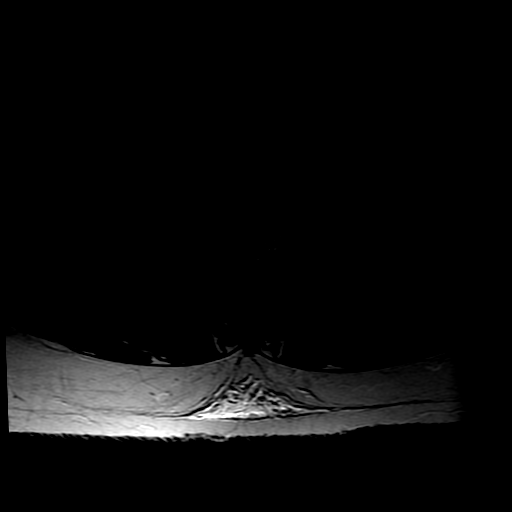
[im 32/40]
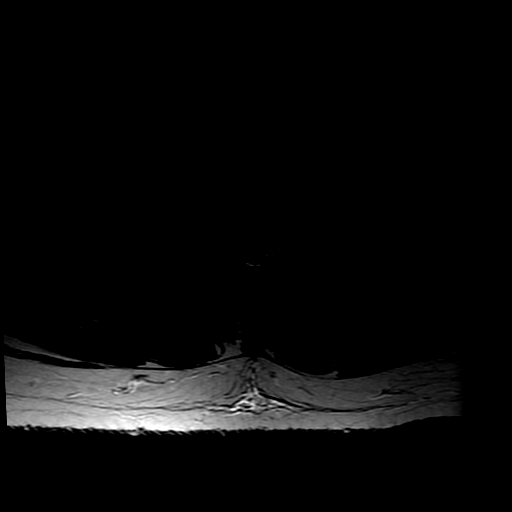
[im 36/40]
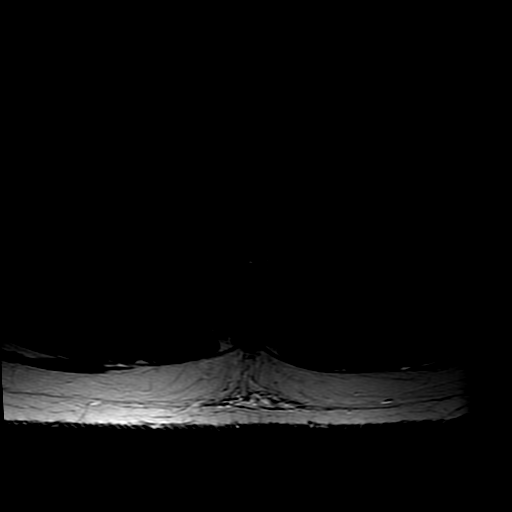

[Series 7: T1 · axial · 4.0mm · 0.39mm/px · z∈[-15,+165]mm · 3 of 40 slices shown (2 of 2)]
[im 4/40]
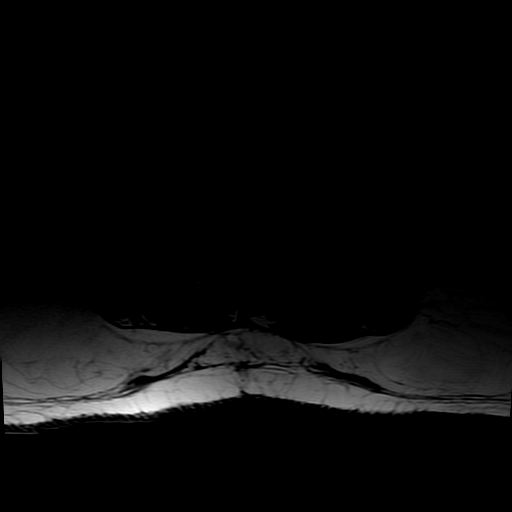
[im 20/40]
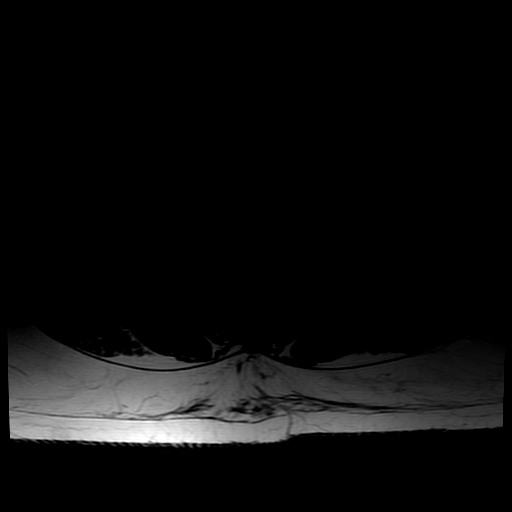
[im 36/40]
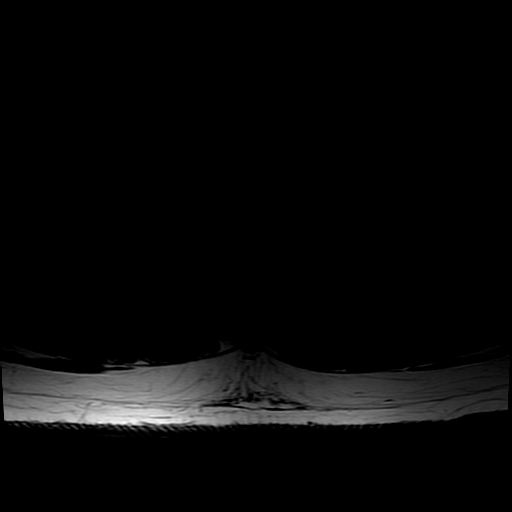

[19 of 48 positions shown; findings below may reference images not displayed]

FINDINGS: Image quality is diminished due to body habitus.

Segmentation: Normal.

Alignment:  Normal.

Vertebrae: No worrisome osseous lesion.Stable L5 hemangioma.

Conus medullaris: Normal in size, signal, and location.

Paraspinal tissues: No evidence for hydronephrosis or paravertebral
mass. Giant LEFT renal cyst is grossly stable from priors. Recommend
continued surveillance with sonography.

Disc levels:

L1-L2: Minor disc space narrowing. No protrusion or stenosis.
Unremarkable.

L2-L3:  Unremarkable.

L3-L4:  Unremarkable.

L4-L5: Wide RIGHT hemilaminectomy. There may be partial facetectomy.
Mild facet hypertrophy. LEFT ligamentum flavum normal size. There is
a recurrent disc extrusion central and to the RIGHT with caudally
migrated free fragment. Narrowing of the subarticular zone and
compression of the RIGHT L5 nerve root. Peridiscal enhancement is
prominent.

L5-S1: Shallow central protrusion. Mild facet arthropathy. No
impingement.
IMPRESSION: Recurrent disc extrusion at L4-5 on the RIGHT. Caudally migrated
free fragment. RIGHT L5 nerve root impingement.

## 2016-08-13 ENCOUNTER — Ambulatory Visit: Payer: Self-pay | Admitting: Cardiovascular Disease

## 2016-08-26 ENCOUNTER — Telehealth: Payer: Self-pay | Admitting: Hematology

## 2016-08-26 ENCOUNTER — Telehealth: Payer: Self-pay | Admitting: Cardiology

## 2016-08-26 NOTE — Telephone Encounter (Signed)
Received records from [facility] for appointment on 09/11/16 with Dr Antoine PocheHochrein.  [facility] for Dr Hochrein's schedule on 09/11/16. lp

## 2016-08-26 NOTE — Telephone Encounter (Signed)
Contacted pt regarding scheduling hem appt, and he was unsure if he should proceed with scheduling due to being unaware of having this referral.  I contacted [facility] regarding patient confusion, provided an appt date/time. Don [name] will reach out to patient and contact me back regarding the appointment.

## 2016-09-04 ENCOUNTER — Encounter (HOSPITAL_COMMUNITY): Payer: Self-pay

## 2016-09-04 ENCOUNTER — Emergency Department (HOSPITAL_COMMUNITY)
Admission: EM | Admit: 2016-09-04 | Discharge: 2016-09-04 | Disposition: A | Discharge status: Home / Self Care | Payer: 59 | Attending: Emergency Medicine | Admitting: Emergency Medicine

## 2016-09-04 ENCOUNTER — Other Ambulatory Visit: Payer: Self-pay | Admitting: Family Medicine

## 2016-09-04 ENCOUNTER — Emergency Department (HOSPITAL_COMMUNITY): Payer: 59

## 2016-09-04 DIAGNOSIS — R0602 Shortness of breath: Secondary | ICD-10-CM

## 2016-09-04 DIAGNOSIS — R911 Solitary pulmonary nodule: Secondary | ICD-10-CM

## 2016-09-04 DIAGNOSIS — J45909 Unspecified asthma, uncomplicated: Secondary | ICD-10-CM | POA: Insufficient documentation

## 2016-09-04 DIAGNOSIS — Z87891 Personal history of nicotine dependence: Secondary | ICD-10-CM | POA: Diagnosis not present

## 2016-09-04 DIAGNOSIS — I10 Essential (primary) hypertension: Secondary | ICD-10-CM | POA: Diagnosis not present

## 2016-09-04 LAB — CBC WITH DIFFERENTIAL/PLATELET
BASOS ABS: 0.1 10*3/uL (ref 0.0–0.1)
BASOS PCT: 1 %
EOS ABS: 1 10*3/uL — AB (ref 0.0–0.7)
Eosinophils Relative: 8 %
HEMATOCRIT: 45.3 % (ref 39.0–52.0)
Hemoglobin: 15.2 g/dL (ref 13.0–17.0)
Lymphocytes Relative: 21 %
Lymphs Abs: 2.7 10*3/uL (ref 0.7–4.0)
MCH: 28.4 pg (ref 26.0–34.0)
MCHC: 33.6 g/dL (ref 30.0–36.0)
MCV: 84.7 fL (ref 78.0–100.0)
MONO ABS: 1 10*3/uL (ref 0.1–1.0)
MONOS PCT: 8 %
NEUTROS ABS: 8.2 10*3/uL — AB (ref 1.7–7.7)
Neutrophils Relative %: 62 %
PLATELETS: 306 10*3/uL (ref 150–400)
RBC: 5.35 MIL/uL (ref 4.22–5.81)
RDW: 14 % (ref 11.5–15.5)
WBC: 13 10*3/uL — ABNORMAL HIGH (ref 4.0–10.5)

## 2016-09-04 LAB — TROPONIN I

## 2016-09-04 LAB — BASIC METABOLIC PANEL
ANION GAP: 8 (ref 5–15)
BUN: 19 mg/dL (ref 6–20)
CALCIUM: 9.1 mg/dL (ref 8.9–10.3)
CO2: 28 mmol/L (ref 22–32)
CREATININE: 1.08 mg/dL (ref 0.61–1.24)
Chloride: 101 mmol/L (ref 101–111)
Glucose, Bld: 99 mg/dL (ref 65–99)
Potassium: 3.9 mmol/L (ref 3.5–5.1)
SODIUM: 137 mmol/L (ref 135–145)

## 2016-09-04 MED ORDER — IOPAMIDOL (ISOVUE-370) INJECTION 76%
INTRAVENOUS | Status: AC
  Administered 2016-09-04: 100 mL
  Filled 2016-09-04: qty 100

## 2016-09-04 MED ORDER — PREDNISONE 50 MG PO TABS: 50.0000 mg | ORAL_TABLET | Freq: Every day | ORAL | 0 refills | Status: DC

## 2016-09-04 MED ORDER — PREDNISONE 20 MG PO TABS
60.0000 mg | ORAL_TABLET | Freq: Once | ORAL | Status: AC
  Administered 2016-09-04: 60 mg via ORAL
  Filled 2016-09-04: qty 3

## 2016-09-04 NOTE — ED Triage Notes (Signed)
Pt here for SOB and was sent by PCP for elevated D-dimer of 0.69. Denies CP.

## 2016-09-04 NOTE — ED Provider Notes (Signed)
[facility]-EMERGENCY DEPT Provider Note   CSN: [id number] Arrival date & time: 09/04/16  1522     History   Chief Complaint Chief Complaint  Patient presents with  . Shortness of Breath  . Abnormal Lab    HPI Don [name] is a 50 y.o. male. He presents from PMD office due to an elevated d-dimer of 0.69.  He was being seen due to worsening shortness of breath over the past month.  He has hx of asthma- uses albuterol every day or every other day- over the past several weeks he has been using albuterol multiple times per day- does not feel it relieves his shortness of breath.  Mild occasional cough.  No chest pain.  No fever/chills.  No significant leg swelling.  No hx of DVT/PE, no recent travel/trauma/surgery.  There are no other associated systemic symptoms, there are no other alleviating or modifying factors.   HPI  Past Medical History:  Diagnosis Date  . Asthma   . Claustrophobia   . Depression   . Hypertension   . Kidney stone    YRS AGO   PASSED ON OWN     Patient Active Problem List   Diagnosis Date Noted  . Herniated lumbar disc without myelopathy 04/23/2015    Past Surgical History:  Procedure Laterality Date  . ANKLE SURGERY     RIGHT  . LASIK    . LUMBAR LAMINECTOMY/DECOMPRESSION MICRODISCECTOMY Right 02/28/2013   Procedure: LUMBAR LAMINECTOMY/DECOMPRESSION MICRODISCECTOMY 1 LEVEL;  Surgeon: Maeola HarmanJoseph Stern, MD;  Location: [facility]UR[facility];  Service: Neurosurgery;  Laterality: Right;  Right Lumbar Four-Five Laminectomy  . MAXIMUM ACCESS (MAS)POSTERIOR LUMBAR INTERBODY FUSION (PLIF) 1 LEVEL N/A 04/23/2015   Procedure: L4-5 Maximum access posterior lumbar interbody fusion;  Surgeon: Maeola HarmanJoseph Stern, MD;  Location: [facility];  Service: Neurosurgery;  Laterality: N/A;  L4-5 Maximum access posterior lumbar interbody fusion  . RADIOLOGY WITH ANESTHESIA N/A 02/05/2015   Procedure: MRI - Lumbar Spine;  Surgeon: Medication Radiologist, MD;  Location: [facility] OR;  Service: Radiology;   Laterality: N/A;  . TONSILLECTOMY     AS CHILD         Home Medications    Prior to Admission medications   Medication Sig Start Date End Date Taking? Authorizing Provider  albuterol (PROVENTIL HFA;VENTOLIN HFA) 108 (90 BASE) MCG/ACT inhaler Inhale 2 puffs into the lungs every 6 (six) hours as needed for shortness of breath.   Yes Historical Provider, MD  amLODipine (NORVASC) 10 MG tablet Take 10 mg by mouth daily. 11/29/15  Yes Historical Provider, MD  diclofenac (VOLTAREN) 50 MG EC tablet Take 50 mg by mouth 2 (two) times daily.   Yes Historical Provider, MD  Fluticasone-Salmeterol (ADVAIR) 100-50 MCG/DOSE AEPB Inhale 1 puff into the lungs 2 (two) times daily.    Yes Historical Provider, MD  hydrochlorothiazide (HYDRODIURIL) 12.5 MG tablet Take 12.5 mg by mouth daily. 08/28/15  Yes Historical Provider, MD  sertraline (ZOLOFT) 50 MG tablet Take 50 mg by mouth daily.   Yes Historical Provider, MD  valsartan (DIOVAN) 320 MG tablet Take 320 mg by mouth daily.    Yes Historical Provider, MD  predniSONE (DELTASONE) 50 MG tablet Take 1 tablet (50 mg total) by mouth daily. 09/04/16   Jerelyn ScottMartha Linker, MD    Family History No family history on file.  Social History Social History  Substance Use Topics  . Smoking status: Former Games developermoker  . Smokeless tobacco: Not on file  . Alcohol use Yes  Comment: SOCIAL      Allergies   Lisinopril   Review of Systems Review of Systems  ROS reviewed and all otherwise negative except for mentioned in HPI   Physical Exam Updated Vital Signs BP 114/98   Pulse 85   Temp 98.1 F (36.7 C) (Oral)   Resp 18   Ht 5\' 9"  (1.753 m)   Wt (!) 149.7 kg   SpO2 96%   BMI 48.73 kg/m  Vitals reviewed Physical Exam Physical Examination: General appearance - alert, well appearing, and in no distress Mental status - alert, oriented to person, place, and time Eyes - no conjunctival injection, no scleral icterus Chest - clear to auscultation, no wheezes,  rales or rhonchi, symmetric air entry, normal respiratory effort Heart - normal rate, regular rhythm, normal S1, S2, no murmurs, rubs, clicks or gallops Abdomen - soft, nontender, nondistended, no masses or organomegaly Neurological - alert, oriented, normal speech Extremities - peripheral pulses normal, no pedal edema, no clubbing or cyanosis Skin - normal coloration and turgor, no rashes  ED Treatments / Results  Labs (all labs ordered are listed, but only abnormal results are displayed) Labs Reviewed  CBC WITH DIFFERENTIAL/PLATELET - Abnormal; Notable for the following:       Result Value   WBC 13.0 (*)    Neutro Abs 8.2 (*)    Eosinophils Absolute 1.0 (*)    All other components within normal limits  BASIC METABOLIC PANEL  TROPONIN I  I-STAT TROPOININ, ED    EKG  EKG Interpretation  Date/Time:  Friday September 04 2016 15:33:52 EST Ventricular Rate:  88 PR Interval:  148 QRS Duration: 84 QT Interval:  340 QTC Calculation: 411 R Axis:   47 Text Interpretation:  Normal sinus rhythm Normal ECG No significant change since last tracing Confirmed by Karma Ganja  MD, Krystena Reitter [phone]) on 09/04/2016 4:27:56 PM       Radiology Dg Chest 2 View  Result Date: 09/04/2016 CLINICAL DATA:  Shortness of breath for 1 month. EXAM: CHEST  2 VIEW COMPARISON:  Chest x-ray June 28, 2013 and chest CT March 29, 2014 FINDINGS: Increased density over the left costochondral junction versus the right is unchanged since 2014 and noted to represent asymmetric degenerative change based on previous CT imaging. Visualized bones are otherwise normal. The heart size is borderline. The hila and mediastinum are normal. No pneumothorax. Scarring or atelectasis seen in the left base. No other acute abnormalities are identified. IMPRESSION: No active cardiopulmonary disease. Electronically Signed   By: Gerome Sam III M.D   On: 09/04/2016 16:13   Ct Angio Chest Pe W Or Wo Contrast  Result Date: 09/04/2016 CLINICAL  DATA:  Shortness of breath with D-dimer 0.69 at primary medical provider. EXAM: CT ANGIOGRAPHY CHEST WITH CONTRAST TECHNIQUE: Multidetector CT imaging of the chest was performed using the standard protocol during bolus administration of intravenous contrast. Multiplanar CT image reconstructions and MIPs were obtained to evaluate the vascular anatomy. CONTRAST:  100 cc Isovue 370 IV COMPARISON:  Radiographs earlier this day.  Chest CT 03/29/2014 FINDINGS: Cardiovascular: There are no filling defects within the pulmonary arteries to suggest pulmonary embolus. There is tortuosity of thoracic aorta without dissection or aneurysm. The heart is at the upper limits of normal in size, with mild increase from 2015. Mediastinum/Nodes: No mediastinal, hilar or axillary adenopathy. No pericardial fluid. Mild thickening of the distal esophagus. There is mediastinal lipomatosis likely secondary to habitus. Lungs/Pleura: Central bronchial thickening. Heterogeneous attenuation throughout both lungs are  nonspecific. There is linear atelectasis in the lingula. No confluent airspace disease. The 5 mm left upper lobe nodule on prior exam is not confidently identified, has not progressed in the interim and may have resolved. No pleural fluid. Upper Abdomen: Hepatic steatosis. The cyst in the left upper kidney is not as well visualized currently given phase of contrast. No acute abnormality. Musculoskeletal: Mild scoliotic curvature of the midthoracic spine. Degenerative change in both shoulders. There are no acute or suspicious osseous abnormalities. Review of the MIP images confirms the above findings. IMPRESSION: 1. No pulmonary embolus. 2. Central bronchial thickening. Heterogeneous attenuation of both lungs is nonspecific, may be secondary to expiratory phase imaging or small airways disease. Electronically Signed   By: Rubye OaksMelanie  Ehinger M.D.   On: 09/04/2016 19:27    Procedures Procedures (including critical care  time)  Medications Ordered in ED Medications  iopamidol (ISOVUE-370) 76 % injection (100 mLs  Contrast Given 09/04/16 1852)  predniSONE (DELTASONE) tablet 60 mg (60 mg Oral Given 09/04/16 2106)     Initial Impression / Assessment and Plan / ED Course  I have reviewed the triage vital signs and the nursing notes.  Pertinent labs & imaging results that were available during my care of the patient were reviewed by me and considered in my medical decision making (see chart for details).  Clinical Course     Pt presenting from PMD for chest ct due to elevated d-dimer of 0.69.  Chest CT is negative for PE.  Did show central bronchial thickening- pt has no frank wheezing on exam in the ED however with hx of asthma- will give short course of steroids to help improve his symptoms. He has normal respiratory effort.  His PMD has already referred him to see cardiology- troponin and EKG reassuring tonight.  Doubt ACS.  Doubt CHF as no findings of pulmonary edema in lungs.  Discharged with strict return precautions.  Pt agreeable with plan.  Final Clinical Impressions(s) / ED Diagnoses   Final diagnoses:  Shortness of breath    New Prescriptions Discharge Medication List as of 09/04/2016  8:57 PM    START taking these medications   Details  predniSONE (DELTASONE) 50 MG tablet Take 1 tablet (50 mg total) by mouth daily., Starting Fri 09/04/2016, Print         Jerelyn ScottMartha Linker, MD 09/04/16 [phone]

## 2016-09-04 NOTE — ED Notes (Signed)
Pt verbalized understanding discharge instructions and denies any further needs or questions at this time. VS stable, ambulatory and steady gait.   

## 2016-09-04 NOTE — Discharge Instructions (Signed)
Return to the ED with any concerns including difficulty breathing despite using albuterol every 4 hours, not drinking fluids, decreased urine output, vomiting and not able to keep down liquids or medications, decreased level of alertness/lethargy, or any other alarming symptoms °

## 2016-09-04 NOTE — ED Notes (Signed)
The wife of the pt is very angry  She demands to klnow what they are waiting on  She reports that they have been in the room since  1500  And they don not know what they are waiting on

## 2016-09-11 ENCOUNTER — Ambulatory Visit: Payer: 59 | Admitting: Cardiology

## 2016-09-21 NOTE — Progress Notes (Signed)
Cardiology Office Note   Date:  09/22/2016   ID:  Don [name], DOB 05-30-1966, MRN [id number]  PCP:  Darrow BussingKOIRALA,DIBAS, MD  Cardiologist:   Rollene RotundaJames Gionna Polak, MD  Referring:  Darrow BussingKOIRALA,DIBAS, MD  No chief complaint on file.     History of Present Illness: Don [name] is a 50 y.o. male who presents for evaluation of dyspnea. He was referred by Darrow BussingKOIRALA,DIBAS, MD  I do see that he was in the ED recently for this.  He had a negative CT for PE.  There was no evidence of ischemia or CHF.  I did review these records.  The patient reports that he's been dyspneic for quite a while. This has been more than a year. He's gained 50-60 pounds in a year. Prior to that he lost weight with diet. He says he's been getting progressively more short of breath with any activities and now at rest areas he's not having any PND or orthopnea. He's not having any chest pressure, neck or arm discomfort. He's had palpitations, presyncope or syncope. He does have asthma and recently was treated with steroids and felt better for a while. She's not had any past cardiac workup other than a normal echo in 2015.    Past Medical History:  Diagnosis Date  . Asthma   . Claustrophobia   . Depression   . Hypertension   . Kidney stone    YRS AGO   PASSED ON OWN     Past Surgical History:  Procedure Laterality Date  . ANKLE SURGERY     RIGHT  . LASIK    . LUMBAR LAMINECTOMY/DECOMPRESSION MICRODISCECTOMY Right 02/28/2013   Procedure: LUMBAR LAMINECTOMY/DECOMPRESSION MICRODISCECTOMY 1 LEVEL;  Surgeon: Maeola HarmanJoseph Stern, MD;  Location: [facility];  Service: Neurosurgery;  Laterality: Right;  Right Lumbar Four-Five Laminectomy  . MAXIMUM ACCESS (MAS)POSTERIOR LUMBAR INTERBODY FUSION (PLIF) 1 LEVEL N/A 04/23/2015   Procedure: L4-5 Maximum access posterior lumbar interbody fusion;  Surgeon: Maeola HarmanJoseph Stern, MD;  Location: [facility];  Service: Neurosurgery;  Laterality: N/A;  L4-5 Maximum access posterior lumbar interbody fusion  .  RADIOLOGY WITH ANESTHESIA N/A 02/05/2015   Procedure: MRI - Lumbar Spine;  Surgeon: Medication Radiologist, MD;  Location: [facility];  Service: Radiology;  Laterality: N/A;  . TONSILLECTOMY     AS CHILD       Current Outpatient Prescriptions  Medication Sig Dispense Refill  . albuterol (PROVENTIL HFA;VENTOLIN HFA) 108 (90 BASE) MCG/ACT inhaler Inhale 2 puffs into the lungs every 6 (six) hours as needed for shortness of breath.    Marland Kitchen. amLODipine (NORVASC) 10 MG tablet Take 10 mg by mouth daily.    . diclofenac (VOLTAREN) 50 MG EC tablet Take 50 mg by mouth 2 (two) times daily.    . Fluticasone-Salmeterol (ADVAIR) 100-50 MCG/DOSE AEPB Inhale 1 puff into the lungs 2 (two) times daily.     . hydrochlorothiazide (HYDRODIURIL) 12.5 MG tablet Take 12.5 mg by mouth daily.    . sertraline (ZOLOFT) 50 MG tablet Take 50 mg by mouth daily.    . valsartan (DIOVAN) 320 MG tablet Take 320 mg by mouth daily.      No current facility-administered medications for this visit.     Allergies:   Lisinopril    Social History:  The patient  reports that he has quit smoking. He does not have any smokeless tobacco history on file. He reports that he drinks alcohol. He reports that he does not use drugs.   Family History:  The patient's family history includes Breast cancer in his mother; Diabetes in his mother; Hypertension in his father and mother; Stroke in his father.    ROS:  Please see the history of present illness.   Otherwise, review of systems are positive for snoring.   All other systems are reviewed and negative.    PHYSICAL EXAM: VS:  BP (!) 148/92 (Cuff Size: Normal)   Pulse 100   Ht 5\' 8"  (1.727 m)   Wt (!) 345 lb (156.5 kg)   SpO2 94%   BMI 52.46 kg/m  , BMI Body mass index is 52.46 kg/m. GENERAL:  Well appearing HEENT:  Pupils equal round and reactive, fundi not visualized, oral mucosa unremarkable NECK:  No jugular venous distention, waveform within normal limits, carotid upstroke brisk and  symmetric, no bruits, no thyromegaly LYMPHATICS:  No cervical, inguinal adenopathy LUNGS:  Clear to auscultation bilaterally BACK:  No CVA tenderness CHEST:  Unremarkable HEART:  PMI not displaced or sustained,S1 and S2 within normal limits, no S3, no S4, no clicks, no rubs, no murmurs ABD:  Flat, positive bowel sounds normal in frequency in pitch, no bruits, no rebound, no guarding, no midline pulsatile mass, no hepatomegaly, no splenomegaly, obese EXT:  2 plus pulses throughout, no edema, no cyanosis no clubbing SKIN:  No rashes no nodules NEURO:  Cranial nerves II through XII grossly intact, motor grossly intact throughout PSYCH:  Cognitively intact, oriented to person place and time    EKG:  EKG is not ordered today. The ekg ordered today demonstrates sinus rhythm, rate 88, axis within normal limits, intervals within normal limits, nonspecific inferior T wave changes.   Recent Labs: 09/04/2016: BUN 19; Creatinine, Ser 1.08; Hemoglobin 15.2; Platelets 306; Potassium 3.9; Sodium 137    Lipid Panel No results found for: CHOL, TRIG, HDL, CHOLHDL, VLDL, LDLCALC, LDLDIRECT    Wt Readings from Last 3 Encounters:  09/22/16 (!) 345 lb (156.5 kg)  09/04/16 (!) 330 lb (149.7 kg)  04/23/15 275 lb (124.7 kg)      Other studies Reviewed: Additional studies/ records that were reviewed today include: ED records, CT, primary care office records including labs. Review of the above records demonstrates:  Please see elsewhere in the note.     ASSESSMENT AND PLAN:  DYSPNEA:  I suspect that this is multifactorial. Certainly weight is playing a role. However, I think is reasonable to screen for heart failure and coronary disease. I will check a BNP level.  I will bring the patient back for a POET (Plain Old Exercise Test). This will allow me to screen for obstructive coronary disease, risk stratify and very importantly provide a prescription for exercise.  OBESITY:  We discussed this at great  length. He is considering bariatric surgery. I would support this as he's tried it has been successful with weight loss but has gained it back. We discussed at great length lifestyle changes to include diet and exercise.  HTN:  His blood pressure is slightly elevated. At this point he will continue with therapeutic lifestyle changes which should bring him to a target blood pressure level.  SNORING:  In the past he has not wanted sleep testing.  He will concentrate on weight loss for now.   Current medicines are reviewed at length with the patient today.  The patient does not have concerns regarding medicines.  The following changes have been made:  no change  Labs/ tests ordered today include:   Orders Placed This Encounter  Procedures  .  B Nat Peptide  . EXERCISE TOLERANCE TEST     Disposition:   FU with me as needed.  I would suggest a pulmonary appt.     Signed, Rollene RotundaJames Lakia Gritton, MD  09/22/2016 4:12 PM    [facility]

## 2016-09-22 ENCOUNTER — Ambulatory Visit (INDEPENDENT_AMBULATORY_CARE_PROVIDER_SITE_OTHER): Payer: 59 | Admitting: Cardiology

## 2016-09-22 ENCOUNTER — Encounter: Payer: Self-pay | Admitting: Cardiology

## 2016-09-22 VITALS — BP 148/92 | HR 100 | Ht 68.0 in | Wt 345.0 lb

## 2016-09-22 DIAGNOSIS — R0602 Shortness of breath: Secondary | ICD-10-CM | POA: Diagnosis not present

## 2016-09-22 NOTE — Patient Instructions (Signed)
Medication Instructions:  Continue current medications  Labwork: BNP  Testing/Procedures: Your physician has requested that you have an exercise tolerance test. For further information please visit [email or web address]. Please also follow instruction sheet, as given.  Follow-Up: Your physician recommends that you schedule a follow-up appointment in: As Needed   Any Other Special Instructions Will Be Listed Below (If Applicable).       HAPPY NEW YEAR   If you need a refill on your cardiac medications before your next appointment, please call your pharmacy.

## 2016-09-23 ENCOUNTER — Telehealth (HOSPITAL_COMMUNITY): Payer: Self-pay

## 2016-09-23 LAB — BRAIN NATRIURETIC PEPTIDE: Brain Natriuretic Peptide: 18.7 pg/mL (ref <100)

## 2016-09-23 NOTE — Telephone Encounter (Signed)
Encounter complete. 

## 2016-09-25 ENCOUNTER — Ambulatory Visit (HOSPITAL_COMMUNITY)
Admission: RE | Admit: 2016-09-25 | Discharge: 2016-09-25 | Disposition: A | Discharge status: Home / Self Care | Payer: 59 | Source: Ambulatory Visit | Attending: Cardiology | Admitting: Cardiology

## 2016-09-25 ENCOUNTER — Ambulatory Visit (INDEPENDENT_AMBULATORY_CARE_PROVIDER_SITE_OTHER): Payer: 59 | Admitting: Allergy

## 2016-09-25 ENCOUNTER — Encounter: Payer: Self-pay | Admitting: Allergy

## 2016-09-25 VITALS — BP 142/92 | HR 96 | Temp 98.2°F | Wt 342.2 lb

## 2016-09-25 DIAGNOSIS — J4540 Moderate persistent asthma, uncomplicated: Secondary | ICD-10-CM | POA: Diagnosis not present

## 2016-09-25 DIAGNOSIS — J3089 Other allergic rhinitis: Secondary | ICD-10-CM

## 2016-09-25 DIAGNOSIS — R0602 Shortness of breath: Secondary | ICD-10-CM | POA: Insufficient documentation

## 2016-09-25 DIAGNOSIS — F329 Major depressive disorder, single episode, unspecified: Secondary | ICD-10-CM | POA: Insufficient documentation

## 2016-09-25 DIAGNOSIS — I10 Essential (primary) hypertension: Secondary | ICD-10-CM | POA: Insufficient documentation

## 2016-09-25 DIAGNOSIS — R911 Solitary pulmonary nodule: Secondary | ICD-10-CM | POA: Insufficient documentation

## 2016-09-25 LAB — EXERCISE TOLERANCE TEST
CHL CUP RESTING HR STRESS: 93 {beats}/min
CHL RATE OF PERCEIVED EXERTION: 18
CSEPED: 5 min
Estimated workload: 7 METS
Exercise duration (sec): 2 s
MPHR: 171 {beats}/min
Peak HR: 146 {beats}/min
Percent HR: 85 %

## 2016-09-25 MED ORDER — BUDESONIDE-FORMOTEROL FUMARATE 160-4.5 MCG/ACT IN AERO: 2.0000 | INHALATION_SPRAY | Freq: Two times a day (BID) | RESPIRATORY_TRACT | 5 refills | Status: DC

## 2016-09-25 MED ORDER — CETIRIZINE HCL 10 MG PO TABS: 10.0000 mg | ORAL_TABLET | Freq: Every day | ORAL | 5 refills | Status: DC

## 2016-09-25 MED ORDER — MONTELUKAST SODIUM 10 MG PO TABS: 10.0000 mg | ORAL_TABLET | Freq: Every day | ORAL | 5 refills | Status: DC

## 2016-09-25 NOTE — Progress Notes (Signed)
Follow-up Note  RE: Don [name] MRN: [id number] DOB: 01-May-1966 Date of Office Visit: 09/25/2016   History of present illness: Don [name] is a 50 y.o. male presenting today for worsening asthma symptoms over the past year.  He was last seen in our office in March 2015 by Dr. Shaune Leeks.  At that time he was being seen for asthma, allergic rhinitis and reflux. He was advised to take Advair 250/50 as well as cetirizine, fluticasone, montelukast, omeprazole and as needed albuterol. He was also treated for an asthma exacerbation with prednisone at that time. He reports over the last 2 years he has been going to see his PCP for his asthma issues and he states he has been getting prednisone and antibiotics.   He states he was on prednisone majority of the summer.  He states he has gained significant weight about 50-60 pounds with all the steroids he has needed to use this past year. He has been having a lot of SOB and coughing as well as wheezing that is worse at night . These symptoms have been ongoing for the past year.  He also reports poor sleep due to mucus draining.  He states that some point in 2015 he lost a lot of weight and his medications were reduced and he is currently taking Advair 100.  He also stopped taking Singulair in 2015.   He has been needing to use his albuterol multiple times a day for relief of his symptoms. Due to the degree of shortness of breath he has seen a cardiologist on December 26 and has a stress test scheduled for this afternoon.    He has been seen in in ED several times and recently had a D-dimer test per pt report was elevated.  He then had a CT angio that he reports showed "lung inflammation".  This workup was done in early December. He states he was put on prednisone for another 5 day course at that time due to the lung inflammation seen on the CT scan.    His PCP has also recommend lap band surgery to help with his weight loss and he is considering this for the  future.    He states he recently bought a new mattress and pillows.  Review of systems: Review of Systems  Constitutional: Positive for malaise/fatigue. Negative for chills, fever and weight loss.  HENT: Positive for congestion. Negative for ear discharge, ear pain, sinus pain and sore throat.   Eyes: Negative for discharge and redness.  Respiratory: Positive for cough, shortness of breath and wheezing.   Cardiovascular: Negative for chest pain and palpitations.  Gastrointestinal: Positive for heartburn. Negative for abdominal pain, nausea and vomiting.  Skin: Negative for itching and rash.    All other systems negative unless noted above in HPI  Past medical/social/surgical/family history have been reviewed and are unchanged unless specifically indicated below.  No changes  Medication List: Allergies as of 09/25/2016      Reactions   Lisinopril Other (See Comments)   Muscle aches and fatigue.       Medication List       Accurate as of 09/25/16 12:45 PM. Always use your most recent med list.          albuterol 108 (90 Base) MCG/ACT inhaler Commonly known as:  PROVENTIL HFA;VENTOLIN HFA Inhale 2 puffs into the lungs every 6 (six) hours as needed for shortness of breath.   amLODipine 10 MG tablet Commonly known as:  NORVASC Take  10 mg by mouth daily.   diclofenac 50 MG EC tablet Commonly known as:  VOLTAREN Take 50 mg by mouth 2 (two) times daily.   Fluticasone-Salmeterol 100-50 MCG/DOSE Aepb Commonly known as:  ADVAIR Inhale 1 puff into the lungs 2 (two) times daily.   hydrochlorothiazide 12.5 MG tablet Commonly known as:  HYDRODIURIL Take 12.5 mg by mouth daily.   sertraline 50 MG tablet Commonly known as:  ZOLOFT Take 50 mg by mouth daily.   valsartan 320 MG tablet Commonly known as:  DIOVAN Take 320 mg by mouth daily.       Known medication allergies: Allergies  Allergen Reactions  . Lisinopril Other (See Comments)    Muscle aches and fatigue.       Physical examination: Blood pressure (!) 142/92, pulse 96, temperature 98.2 F (36.8 C), temperature source Oral, weight (!) 342 lb 3.2 oz (155.2 kg), SpO2 94 %.  General: Alert, interactive, in no acute distress. HEENT: TMs pearly gray, turbinates moderately edematous without discharge, post-pharynx non erythematous. Neck: Supple without lymphadenopathy. Lungs: Decreased breath sounds due to body habitus however breath sounds are clear throughout. {no increased work of breathing. CV: Normal S1, S2 without murmurs. Abdomen: Nondistended, nontender. Skin: Warm and dry, without lesions or rashes. Extremities:  No clubbing, cyanosis or edema. Neuro:   Grossly intact.  Diagnositics/Labs: Labs/Imaging: Personally reviewed CT angio with contrast from 09/04/16 with no evidence of Pe. Official read: No pulmonary embolus.  Central bronchial thickening. Heterogeneous attenuation of both lungs is nonspecific, may be secondary to expiratory phase imaging or small airway disease. Personally reviewed chest x-ray from 09/04/16 with no focal opacities and normal study. Official read: No active cardiopulmonary disease.  D-dimer 0.69 Component     Latest Ref Rng & Units 09/04/2016  WBC     4.0 - 10.5 K/uL 13.0 (H)  RBC     4.22 - 5.81 MIL/uL 5.35  Hemoglobin     13.0 - 17.0 g/dL 15.2  HCT     39.0 - 52.0 % 45.3  MCV     78.0 - 100.0 fL 84.7  MCH     26.0 - 34.0 pg 28.4  MCHC     30.0 - 36.0 g/dL 33.6  RDW     11.5 - 15.5 % 14.0  Platelets     150 - 400 K/uL 306  Neutrophils     % 62  NEUT#     1.7 - 7.7 K/uL 8.2 (H)  Lymphocytes     % 21  Lymphocyte #     0.7 - 4.0 K/uL 2.7  Monocytes Relative     % 8  Monocyte #     0.1 - 1.0 K/uL 1.0  Eosinophil     % 8  Eosinophils Absolute     0.0 - 0.7 K/uL 1.0 (H)  Basophil     % 1  Basophils Absolute     0.0 - 0.1 K/uL 0.1   Component     Latest Ref Rng & Units 09/04/2016  Sodium     135 - 145 mmol/L 137  Potassium     3.5 -  5.1 mmol/L 3.9  Chloride     101 - 111 mmol/L 101  CO2     22 - 32 mmol/L 28  Glucose     65 - 99 mg/dL 99  BUN     6 - 20 mg/dL 19  Creatinine     0.61 - 1.24 mg/dL 1.08  Calcium  8.9 - 10.3 mg/dL 9.1  EGFR (Non-African Amer.)     >60 mL/min >60  EGFR (African American)     >60 mL/min >60  Anion gap     5 - 15 8   Spirometry: FEV1: 2.65L  82%, FVC: 3.41L  87%, ratio consistent with Nonobstructive pattern  Assessment and plan:   Asthma, moderate persistent    - not well controlled at this time    - He has been on Advair for many years now and will change therapy.     - start Symbicort 162mg 2 puffs twice a day     - albuterol as needed every 4 hours for cough/wheeze/difficulty breathing    - resume Singulair '10mg'$  daily - take at bedtime    - He has quite the degree of eosinophilia which is likely tied in to his poor asthma control.  Since he has not been on a appropriate asthma regimen will start with the above. We briefly discussed biologic agents like Nucala and Fasenra today. He will return in 1 month and if he has not had significant improvement we'll discuss these options further.  Allergic rhinitis    - He has a sensitivity to dust mite    -Resume Zyrtec '10mg'$  daily    - resume Flonase 2 sprays each nostril as needed for congestion or drainage  Agree with further cardiology evaluation. Discussed at length weight loss today.  He will definitely have better control of his asthma with weight loss.  Follow-up 4 weeks  I appreciate the opportunity to take part in Derran's care. Please do not hesitate to contact me with questions.  Sincerely,   SPrudy Feeler MD Allergy/Immunology [facility]

## 2016-09-25 NOTE — Patient Instructions (Addendum)
Asthma    - not well controlled at this time    - stop Advair.     - start Symbicort 160mcg 2 puffs twice a day     - albuterol as needed every 4 hours for cough/wheeze/difficulty breathing    - resume Singulair 10mg  daily - take at bedtime    - we will discuss further options if regimen above is not enough to control your symptoms.   Allergic rhinitis    - He has a sensitivity to dust mite    -Resume Zyrtec 10mg  daily    - resume Flonase 2 sprays each nostril as needed for congestion or drainage  Follow-up 4 weeks

## 2016-10-01 ENCOUNTER — Encounter: Payer: Self-pay | Admitting: *Deleted

## 2016-10-06 ENCOUNTER — Telehealth: Payer: Self-pay | Admitting: Hematology

## 2016-10-06 NOTE — Telephone Encounter (Signed)
Lft vm on the wife's phone to reschedule appt.  °

## 2016-10-06 NOTE — Telephone Encounter (Signed)
Patient's wife called to reschedule his appointment for tomorrow 1/10 on Friday 1/12 or this Monday 1/15.  If neither will work he is off every Friday so he can come in then.    Call back number is [phone]

## 2016-10-07 ENCOUNTER — Encounter: Payer: Self-pay | Admitting: Hematology

## 2016-10-09 ENCOUNTER — Other Ambulatory Visit (HOSPITAL_BASED_OUTPATIENT_CLINIC_OR_DEPARTMENT_OTHER): Payer: 59

## 2016-10-09 ENCOUNTER — Ambulatory Visit (HOSPITAL_BASED_OUTPATIENT_CLINIC_OR_DEPARTMENT_OTHER): Payer: 59 | Admitting: Oncology

## 2016-10-09 ENCOUNTER — Other Ambulatory Visit (HOSPITAL_COMMUNITY)
Admission: RE | Admit: 2016-10-09 | Discharge: 2016-10-09 | Disposition: A | Discharge status: Home / Self Care | Payer: 59 | Source: Ambulatory Visit | Attending: Oncology | Admitting: Oncology

## 2016-10-09 ENCOUNTER — Telehealth: Payer: Self-pay | Admitting: Oncology

## 2016-10-09 DIAGNOSIS — D729 Disorder of white blood cells, unspecified: Secondary | ICD-10-CM

## 2016-10-09 DIAGNOSIS — D72829 Elevated white blood cell count, unspecified: Secondary | ICD-10-CM | POA: Diagnosis present

## 2016-10-09 DIAGNOSIS — Z803 Family history of malignant neoplasm of breast: Secondary | ICD-10-CM

## 2016-10-09 LAB — COMPREHENSIVE METABOLIC PANEL
ALK PHOS: 102 U/L (ref 40–150)
ALT: 35 U/L (ref 0–55)
ANION GAP: 8 meq/L (ref 3–11)
AST: 21 U/L (ref 5–34)
Albumin: 3.5 g/dL (ref 3.5–5.0)
BILIRUBIN TOTAL: 0.56 mg/dL (ref 0.20–1.20)
BUN: 16.8 mg/dL (ref 7.0–26.0)
CO2: 30 meq/L — AB (ref 22–29)
Calcium: 9.8 mg/dL (ref 8.4–10.4)
Chloride: 101 mEq/L (ref 98–109)
Creatinine: 1.2 mg/dL (ref 0.7–1.3)
EGFR: 73 mL/min/{1.73_m2} — AB (ref >90)
GLUCOSE: 96 mg/dL (ref 70–140)
POTASSIUM: 5 meq/L (ref 3.5–5.1)
SODIUM: 139 meq/L (ref 136–145)
TOTAL PROTEIN: 7 g/dL (ref 6.4–8.3)

## 2016-10-09 LAB — CBC WITH DIFFERENTIAL/PLATELET
BASO%: 1.5 % (ref 0.0–2.0)
BASOS ABS: 0.2 10*3/uL — AB (ref 0.0–0.1)
EOS ABS: 1 10*3/uL — AB (ref 0.0–0.5)
EOS%: 8.7 % — ABNORMAL HIGH (ref 0.0–7.0)
HCT: 45.2 % (ref 38.4–49.9)
HGB: 15.1 g/dL (ref 13.0–17.1)
LYMPH%: 20.4 % (ref 14.0–49.0)
MCH: 27.7 pg (ref 27.2–33.4)
MCHC: 33.5 g/dL (ref 32.0–36.0)
MCV: 82.6 fL (ref 79.3–98.0)
MONO#: 1 10*3/uL — AB (ref 0.1–0.9)
MONO%: 8.8 % (ref 0.0–14.0)
NEUT#: 7 10*3/uL — ABNORMAL HIGH (ref 1.5–6.5)
NEUT%: 60.6 % (ref 39.0–75.0)
PLATELETS: 321 10*3/uL (ref 140–400)
RBC: 5.48 10*6/uL (ref 4.20–5.82)
RDW: 14.1 % (ref 11.0–14.6)
WBC: 11.6 10*3/uL — ABNORMAL HIGH (ref 4.0–10.3)
lymph#: 2.4 10*3/uL (ref 0.9–3.3)

## 2016-10-09 LAB — LACTATE DEHYDROGENASE: LDH: 209 U/L (ref 125–245)

## 2016-10-09 LAB — TECHNOLOGIST REVIEW

## 2016-10-09 NOTE — Telephone Encounter (Signed)
GAVE PATIENT AVS REPORT AND APPOINTMENTS FOR January  °

## 2016-10-09 NOTE — Progress Notes (Signed)
[facility] Initial Visit:  Patient Care Team: Dibas Koirala, MD as PCP - General (Family Medicine)  CHIEF COMPLAINTS/PURPOSE OF CONSULTATION:  Leukocytosis  HISTORY OF PRESENTING ILLNESS: Don [name] 51 y.o. male is here because of leukocytosis. He had a CBC performed on 09/04/16 was demonstrated WBC 13K, hemoglobin 15.2 g/dL, hematocrit 40.9%, platelet count 306K, differential was normal except for an elevated ANC of 8.2K and absolute eosinophil 1K. Review of his previous CBCs dating back to May 2014 demonstrated that he always had a mildly elevated WBC count between 11.2 K to 13.7 K. Patient denies any chronic infections, he does state that he occasionally gets sinus infections. He denies any chronic steroid use, although he has been placed on prednisone in the past for pulmonary issues, and states that he rarely smokes cigarettes.   Review of Systems  Constitutional: Negative for appetite change, chills, fatigue and fever.  HENT:   Negative for hearing loss, lump/mass, mouth sores, sore throat and tinnitus.   Eyes: Negative for eye problems and icterus.  Respiratory: Positive for shortness of breath. Negative for chest tightness, cough, hemoptysis and wheezing.   Cardiovascular: Negative for chest pain, leg swelling and palpitations.  Gastrointestinal: Negative for abdominal distention, abdominal pain, blood in stool, diarrhea, nausea and vomiting.  Endocrine: Negative.  Negative for hot flashes.  Genitourinary: Negative for difficulty urinating, frequency and hematuria.   Musculoskeletal: Negative for arthralgias and neck pain.  Skin: Negative for itching and rash.  Neurological: Negative for dizziness, headaches and speech difficulty.  Hematological: Negative for adenopathy. Does not bruise/bleed easily.  Psychiatric/Behavioral: Negative for confusion. The patient is not nervous/anxious.     MEDICAL HISTORY: Past Medical History:  Diagnosis Date  . Asthma    . Claustrophobia   . Depression   . Hypertension   . Kidney stone    YRS AGO   PASSED ON OWN     SURGICAL HISTORY: Past Surgical History:  Procedure Laterality Date  . ANKLE SURGERY     RIGHT  . LASIK    . LUMBAR LAMINECTOMY/DECOMPRESSION MICRODISCECTOMY Right 02/28/2013   Procedure: LUMBAR LAMINECTOMY/DECOMPRESSION MICRODISCECTOMY 1 LEVEL;  Surgeon: Maeola Harman, MD;  Location: [facility];  Service: Neurosurgery;  Laterality: Right;  Right Lumbar Four-Five Laminectomy  . MAXIMUM ACCESS (MAS)POSTERIOR LUMBAR INTERBODY FUSION (PLIF) 1 LEVEL N/A 04/23/2015   Procedure: L4-5 Maximum access posterior lumbar interbody fusion;  Surgeon: Maeola Harman, MD;  Location: [facility];  Service: Neurosurgery;  Laterality: N/A;  L4-5 Maximum access posterior lumbar interbody fusion  . RADIOLOGY WITH ANESTHESIA N/A 02/05/2015   Procedure: MRI - Lumbar Spine;  Surgeon: Medication Radiologist, MD;  Location: [facility] OR;  Service: Radiology;  Laterality: N/A;  . TONSILLECTOMY     AS CHILD      SOCIAL HISTORY: Social History   Social History  . Marital status: Married    Spouse name: N/A  . Number of children: 3  . Years of education: N/A   Occupational History  . Landscaping [employer]   Social History Main Topics  . Smoking status: Former Games developer  . Smokeless tobacco: Never Used  . Alcohol use Yes     Comment: SOCIAL   . Drug use: No  . Sexual activity: Not on file   Other Topics Concern  . Not on file   Social History Narrative  . No narrative on file    FAMILY HISTORY Family History  Problem Relation Age of Onset  . Hypertension Mother   .  Breast cancer Mother   . Diabetes Mother   . Hypertension Father   . Stroke Father   . Allergic rhinitis Neg Hx   . Angioedema Neg Hx   . Asthma Neg Hx   . Eczema Neg Hx   . Immunodeficiency Neg Hx   . Urticaria Neg Hx   . Atopy Neg Hx     ALLERGIES:  is allergic to lisinopril.  MEDICATIONS:  Current Outpatient Prescriptions   Medication Sig Dispense Refill  . albuterol (PROVENTIL HFA;VENTOLIN HFA) 108 (90 BASE) MCG/ACT inhaler Inhale 2 puffs into the lungs every 6 (six) hours as needed for shortness of breath.    Marland Kitchen amLODipine (NORVASC) 10 MG tablet Take 10 mg by mouth daily.    . budesonide-formoterol (SYMBICORT) 160-4.5 MCG/ACT inhaler Inhale 2 puffs into the lungs 2 (two) times daily. 1 Inhaler 5  . cetirizine (ZYRTEC) 10 MG tablet Take 1 tablet (10 mg total) by mouth daily. 30 tablet 5  . diclofenac (VOLTAREN) 50 MG EC tablet Take 50 mg by mouth 2 (two) times daily.    . hydrochlorothiazide (HYDRODIURIL) 12.5 MG tablet Take 12.5 mg by mouth daily.    . montelukast (SINGULAIR) 10 MG tablet Take 1 tablet (10 mg total) by mouth at bedtime. 30 tablet 5  . sertraline (ZOLOFT) 50 MG tablet Take 50 mg by mouth daily.    . valsartan (DIOVAN) 320 MG tablet Take 320 mg by mouth daily.      No current facility-administered medications for this visit.     PHYSICAL EXAMINATION:    Vitals:   10/09/16 1045  BP: (!) 142/94  Pulse: 98  Resp: 19  Temp: 98.4 F (36.9 C)    Filed Weights   10/09/16 1045  Weight: (!) 337 lb (152.9 kg)     Physical Exam  Constitutional: He is oriented to person, place, and time and well-developed, well-nourished, and in no distress. No distress.  HENT:  Head: Normocephalic and atraumatic.  Mouth/Throat: No oropharyngeal exudate.  Eyes: Conjunctivae are normal. Pupils are equal, round, and reactive to light. No scleral icterus.  Neck: Normal range of motion. Neck supple. No JVD present.  Cardiovascular: Normal rate, regular rhythm and normal heart sounds.  Exam reveals no gallop and no friction rub.   No murmur heard. Pulmonary/Chest: Breath sounds normal. No respiratory distress. He has no wheezes. He has no rales.  Abdominal: Soft. Bowel sounds are normal. He exhibits no distension. There is no tenderness. There is no guarding.  Musculoskeletal: He exhibits no edema or  tenderness.  Lymphadenopathy:    He has no cervical adenopathy.  Neurological: He is alert and oriented to person, place, and time. No cranial nerve deficit.  Skin: Skin is warm and dry. No rash noted. No erythema. No pallor.  Psychiatric: Affect and judgment normal.     LABORATORY DATA: I have personally reviewed the data as listed:  Hospital Outpatient Visit on 09/25/2016  Component Date Value Ref Range Status  . Rest HR 09/25/2016 93  bpm Final  . Rest BP 09/25/2016 125/85  mmHg Final  . RPE 09/25/2016 18   Final  . Exercise duration (sec) 09/25/2016 2  sec Final  . Percent HR 09/25/2016 85  % Final  . Exercise duration (min) 09/25/2016 5  min Final  . Estimated workload 09/25/2016 7.0  METS Final  . Peak HR 09/25/2016 146  bpm Final  . Peak BP 09/25/2016 203/92  mmHg Final  . MPHR 09/25/2016 171  bpm Final  Office Visit on 09/22/2016  Component Date Value Ref Range Status  . Brain Natriuretic Peptide 09/23/2016 18.7  <100 pg/mL Final   Comment:   BNP levels increase with age in the general population with the highest values seen in individuals greater than 50 years of age. Reference: Earlyne IbaJ Am Coll Cardiol 2002; 86:578-4640:976-82.       RADIOGRAPHIC STUDIES: I have personally reviewed the radiological images as listed and agree with the findings in the report  No results found.  ASSESSMENT/PLAN 51 year old male presents today for evaluation of chronic mild leukocytosis with neutrophilic predominance.  Plan: I will perform a leukemia workup with CBC, CMP, LDH, chronic flow cytometry to rule out chronic leukemias, as well as a BCR-ABL gene mutation to rule out CML.  Return to clinic in 10 days to review lab work to discuss next plan of care.  Orders Placed This Encounter  Procedures  . CBC with Differential    Standing Status:   Future    Standing Expiration Date:   10/09/2017  . Comprehensive metabolic panel    Standing Status:   Future    Standing Expiration Date:    10/09/2017  . Lactate dehydrogenase (LDH)    Standing Status:   Future    Standing Expiration Date:   10/09/2017  . Flow Cytometry    Chronic flow cytometry H/o lymphocytosis rule out CLL    Standing Status:   Future    Standing Expiration Date:   10/09/2017  . bcr/abl    Standing Status:   Future    Standing Expiration Date:   10/09/2017  . Myeloproliferative Neoplasm Panel    Standing Status:   Future    Standing Expiration Date:   10/09/2017    All questions were answered. The patient knows to call the clinic with any problems, questions or concerns.  This note was electronically signed.    Ralene CorkLouise Yann Biehn, MD  10/09/2016 11:17 AM

## 2016-10-13 DIAGNOSIS — D729 Disorder of white blood cells, unspecified: Secondary | ICD-10-CM | POA: Diagnosis not present

## 2016-10-13 LAB — FLOW CYTOMETRY

## 2016-10-20 ENCOUNTER — Telehealth: Payer: Self-pay | Admitting: Oncology

## 2016-10-20 NOTE — Telephone Encounter (Addendum)
Discussed with Dr. Janyth ContesZhou.  She will review results and call patient tomorrow  10/21/16.    [phone] -patient call back. Called patient back and spoke with wife, Lacey Jenseniziana Mingle  (who called in with this request).  She states that no one will be home tomorrow at the above number.  Patient is not allowed to take phone calls at work.  She requests that Dr. Janyth ContesZhou call her on her cell phone which is [phone].  Let her know that I would make Dr. Janyth ContesZhou aware of her request. (Note:  Patient's appointment for Friday 10/23/16 is already cancelled.)

## 2016-10-20 NOTE — Telephone Encounter (Signed)
Wife, Lacey Jenseniziana Voytko, is listed on patient's ROI - [facility] form signed 10-09-16.

## 2016-10-20 NOTE — Telephone Encounter (Signed)
Pt wife called to cxl pt 1/26 follow up appt. Pt wife said that "he just doesn't want to come and just get lab results and pay a $50 copay. Pt wife requested to get results via phone or MyChart and if he needs to return to clinic he will." Transferred call to Dr Janyth ContesZhou desk nurse ext. cxled 1/26 appt per request

## 2016-10-21 ENCOUNTER — Telehealth: Payer: Self-pay | Admitting: Medical Oncology

## 2016-10-21 NOTE — Telephone Encounter (Signed)
I called wife and per Dr Janyth ContesZhou she is still awaiting 2 of his send out labs to come back. Will let him know the results when everything has returned.She requested to please call her on her cell phone as pt cannot accept calls at work.

## 2016-10-21 NOTE — Telephone Encounter (Signed)
Can you please call the patient back and let him know that I'm still awaiting 2 of his send out labs to come back. Will let him know the results when everything has returned.

## 2016-10-23 ENCOUNTER — Ambulatory Visit: Payer: Self-pay

## 2016-10-30 ENCOUNTER — Ambulatory Visit (INDEPENDENT_AMBULATORY_CARE_PROVIDER_SITE_OTHER): Payer: 59 | Admitting: Allergy

## 2016-10-30 ENCOUNTER — Encounter: Payer: Self-pay | Admitting: Allergy

## 2016-10-30 VITALS — BP 134/90 | HR 88 | Temp 97.9°F | Resp 20 | Ht 67.5 in | Wt 333.6 lb

## 2016-10-30 DIAGNOSIS — J3089 Other allergic rhinitis: Secondary | ICD-10-CM | POA: Diagnosis not present

## 2016-10-30 DIAGNOSIS — J4540 Moderate persistent asthma, uncomplicated: Secondary | ICD-10-CM

## 2016-10-30 NOTE — Progress Notes (Signed)
Follow-up Note  RE: Don [name] [name] MRN: [id number] DOB: Jun 08, 1966 Date of Office Visit: 10/30/2016   History of present illness: Don [name] is a 51 y.o. male presenting today for follow-up of asthma and allergic rhinitis.  He was last seen by myself on 09/25/16 for follow-up.   He was changed from Advair to Symbicort at his last visit and advised to restart Singulair.  He has been taking his Symbicort 2 puffs every 12 hours however he reports that he has not made his Singulair every day. However still using albuterol on average once every other day.  He has had decreasing use of albuterol use.  He still has some chest tightness, cough and wheeze especially when it is colder out.   He does take Zyrtec on most days and uses Flonase as needed for nasal congestion. He is still working on losing weight.  Review of systems: Review of Systems  Constitutional: Negative for chills, fever and malaise/fatigue.  HENT: Positive for congestion. Negative for ear discharge, ear pain, nosebleeds, sinus pain and sore throat.   Eyes: Negative for discharge and redness.  Respiratory: Positive for cough, shortness of breath and wheezing.   Cardiovascular: Negative for chest pain.  Gastrointestinal: Negative for abdominal pain, heartburn, nausea and vomiting.  Skin: Negative for itching and rash.    All other systems negative unless noted above in HPI  Past medical/social/surgical/family history have been reviewed and are unchanged unless specifically indicated below.  No changes  Medication List: Allergies as of 10/30/2016      Reactions   Lisinopril Other (See Comments)   Muscle aches and fatigue.       Medication List       Accurate as of 10/30/16 12:58 PM. Always use your most recent med list.          albuterol 108 (90 Base) MCG/ACT inhaler Commonly known as:  PROVENTIL HFA;VENTOLIN HFA Inhale 2 puffs into the lungs every 6 (six) hours as needed for shortness of breath.   amLODipine  10 MG tablet Commonly known as:  NORVASC Take 10 mg by mouth daily.   budesonide-formoterol 160-4.5 MCG/ACT inhaler Commonly known as:  SYMBICORT Inhale 2 puffs into the lungs 2 (two) times daily.   cetirizine 10 MG tablet Commonly known as:  ZYRTEC Take 1 tablet (10 mg total) by mouth daily.   diclofenac 50 MG EC tablet Commonly known as:  VOLTAREN Take 50 mg by mouth 2 (two) times daily.   hydrochlorothiazide 12.5 MG tablet Commonly known as:  HYDRODIURIL Take 12.5 mg by mouth daily.   montelukast 10 MG tablet Commonly known as:  SINGULAIR Take 1 tablet (10 mg total) by mouth at bedtime.   sertraline 50 MG tablet Commonly known as:  ZOLOFT Take 50 mg by mouth daily.   valsartan 320 MG tablet Commonly known as:  DIOVAN Take 320 mg by mouth daily.       Known medication allergies: Allergies  Allergen Reactions  . Lisinopril Other (See Comments)    Muscle aches and fatigue.      Physical examination: Blood pressure 134/90, pulse 88, temperature 97.9 F (36.6 C), temperature source Oral, resp. rate 20, height 5' 7.5" (1.715 m), weight (!) 333 lb 9.6 oz (151.3 kg), SpO2 95 %.  General: Alert, interactive, in no acute distress. obese HEENT: TMs pearly gray, turbinates mildly edematous without discharge, post-pharynx non erythematous. Neck: Supple without lymphadenopathy. Lungs: Decreased breath sounds bilaterally due to body habitus without wheezing, rhonchi or rales. {  no increased work of breathing. CV: Normal S1, S2 without murmurs. Abdomen: Nondistended, nontender. Skin: Warm and dry, without lesions or rashes. Extremities:  No clubbing, cyanosis or edema. Neuro:   Grossly intact.  Diagnositics/Labs: Labs:  Component     Latest Ref Rng & Units 10/09/2016  WBC     4.0 - 10.3 10e3/uL 11.6 (H)  NEUT#     1.5 - 6.5 10e3/uL 7.0 (H)  Hemoglobin     13.0 - 17.1 g/dL 16.1  HCT     09.6 - 04.5 % 45.2  Platelets     140 - 400 10e3/uL 321  MCV     79.3 -  98.0 fL 82.6  MCH     27.2 - 33.4 pg 27.7  MCHC     32.0 - 36.0 g/dL 40.9  RBC     8.11 - 9.14 10e6/uL 5.48  RDW     11.0 - 14.6 % 14.1  lymph#     0.9 - 3.3 10e3/uL 2.4  MONO#     0.1 - 0.9 10e3/uL 1.0 (H)  Eosinophils Absolute     0.0 - 0.5 10e3/uL 1.0 (H)  Basophils Absolute     0.0 - 0.1 10e3/uL 0.2 (H)  NEUT%     39.0 - 75.0 % 60.6  LYMPH%     14.0 - 49.0 % 20.4  MONO%     0.0 - 14.0 % 8.8  EOS%     0.0 - 7.0 % 8.7 (H)  BASO%     0.0 - 2.0 % 1.5   Spirometry: FEV1: 3.17L  89%, FVC: 3.95L  91%, ratio consistent with Nonobstructive pattern   Assessment and plan:   Asthma, Moderate persistent    - Improved control from last visit however still remains in poor control    - continue Symbicort 2 puffs twice a day     - albuterol as needed every 4 hours for cough/wheeze/difficulty breathing    - continue Singulair 10mg  daily - take at bedtime    - we will start the approval process for Fasenra (biologic agent) to help better control your asthma.  Discussed benefits and risk of Fasenra bacillus dosing schedule and provided with drug brochure.    Allergic rhinitis    - He has a sensitivity to dust mite    -continue Zyrtec 10mg  daily    - continue Flonase 2 sprays each nostril as needed for congestion or drainage  Follow-up 2-3 months   I appreciate the opportunity to take part in Markie's care. Please do not hesitate to contact me with questions.  Sincerely,   Margo Aye, MD Allergy/Immunology [facility]

## 2016-10-30 NOTE — Patient Instructions (Addendum)
Asthma0    - better controlled at this time    - continue Symbicort 160mcg 2 puffs twice a day     - albuterol as needed every 4 hours for cough/wheeze/difficulty breathing    - continue Singulair 10mg  daily - take at bedtime    - we will start the approval process for Fasenra (biologic agent) to help better control your asthma  Allergic rhinitis    - He has a sensitivity to dust mite    -continue Zyrtec 10mg  daily    - continue Flonase 2 sprays each nostril as needed for congestion or drainage  Follow-up 2-3 months

## 2016-11-24 ENCOUNTER — Ambulatory Visit: Payer: 59

## 2016-11-30 DIAGNOSIS — J4550 Severe persistent asthma, uncomplicated: Secondary | ICD-10-CM | POA: Diagnosis not present

## 2016-12-03 ENCOUNTER — Ambulatory Visit (INDEPENDENT_AMBULATORY_CARE_PROVIDER_SITE_OTHER): Payer: 59

## 2016-12-03 ENCOUNTER — Other Ambulatory Visit: Payer: Self-pay

## 2016-12-03 DIAGNOSIS — J4550 Severe persistent asthma, uncomplicated: Secondary | ICD-10-CM | POA: Diagnosis not present

## 2016-12-03 DIAGNOSIS — J4540 Moderate persistent asthma, uncomplicated: Secondary | ICD-10-CM

## 2016-12-03 MED ORDER — EPINEPHRINE 0.3 MG/0.3ML IJ SOAJ: 0.3000 mg | Freq: Once | INTRAMUSCULAR | 1 refills | Status: AC

## 2016-12-03 NOTE — Telephone Encounter (Signed)
[facility]

## 2016-12-03 NOTE — Progress Notes (Signed)
Immunotherapy   Patient Details  Name: Don [name] MRN: [id number] Date of Birth: 1966-03-07  12/03/2016  Don [name] started injections for  Fasenra Following schedule: Q 4 weeks for first 3 doses, then Q 8 weeks.   Epi-Pen:Epi-Pen Available  Consent signed and patient instructions given.   [name] 12/03/2016, 9:33 AM

## 2016-12-04 ENCOUNTER — Ambulatory Visit: Payer: 59

## 2016-12-28 DIAGNOSIS — J4550 Severe persistent asthma, uncomplicated: Secondary | ICD-10-CM | POA: Diagnosis not present

## 2016-12-31 ENCOUNTER — Ambulatory Visit (INDEPENDENT_AMBULATORY_CARE_PROVIDER_SITE_OTHER): Payer: 59 | Admitting: *Deleted

## 2016-12-31 DIAGNOSIS — J4540 Moderate persistent asthma, uncomplicated: Secondary | ICD-10-CM

## 2016-12-31 DIAGNOSIS — J4550 Severe persistent asthma, uncomplicated: Secondary | ICD-10-CM | POA: Diagnosis not present

## 2016-12-31 MED ORDER — BENRALIZUMAB 30 MG/ML ~~LOC~~ SOSY
30.0000 mg | PREFILLED_SYRINGE | SUBCUTANEOUS | Status: AC
  Administered 2016-12-31 – 2017-01-28 (×2): 30 mg via SUBCUTANEOUS

## 2017-01-25 DIAGNOSIS — J4550 Severe persistent asthma, uncomplicated: Secondary | ICD-10-CM | POA: Diagnosis not present

## 2017-01-28 ENCOUNTER — Ambulatory Visit (INDEPENDENT_AMBULATORY_CARE_PROVIDER_SITE_OTHER): Payer: 59 | Admitting: *Deleted

## 2017-01-28 DIAGNOSIS — J4550 Severe persistent asthma, uncomplicated: Secondary | ICD-10-CM

## 2017-02-05 DIAGNOSIS — R7303 Prediabetes: Secondary | ICD-10-CM | POA: Diagnosis not present

## 2017-02-05 DIAGNOSIS — R911 Solitary pulmonary nodule: Secondary | ICD-10-CM | POA: Diagnosis not present

## 2017-02-12 DIAGNOSIS — E291 Testicular hypofunction: Secondary | ICD-10-CM | POA: Diagnosis not present

## 2017-02-15 DIAGNOSIS — I10 Essential (primary) hypertension: Secondary | ICD-10-CM | POA: Diagnosis not present

## 2017-02-15 DIAGNOSIS — R7303 Prediabetes: Secondary | ICD-10-CM | POA: Diagnosis not present

## 2017-02-18 DIAGNOSIS — I10 Essential (primary) hypertension: Secondary | ICD-10-CM | POA: Diagnosis not present

## 2017-02-18 DIAGNOSIS — R7303 Prediabetes: Secondary | ICD-10-CM | POA: Diagnosis not present

## 2017-02-26 DIAGNOSIS — R7303 Prediabetes: Secondary | ICD-10-CM | POA: Diagnosis not present

## 2017-02-26 DIAGNOSIS — I10 Essential (primary) hypertension: Secondary | ICD-10-CM | POA: Diagnosis not present

## 2017-03-05 DIAGNOSIS — I10 Essential (primary) hypertension: Secondary | ICD-10-CM | POA: Diagnosis not present

## 2017-03-05 DIAGNOSIS — E291 Testicular hypofunction: Secondary | ICD-10-CM | POA: Diagnosis not present

## 2017-03-05 DIAGNOSIS — R7303 Prediabetes: Secondary | ICD-10-CM | POA: Diagnosis not present

## 2017-03-19 DIAGNOSIS — E291 Testicular hypofunction: Secondary | ICD-10-CM | POA: Diagnosis not present

## 2017-03-19 DIAGNOSIS — I10 Essential (primary) hypertension: Secondary | ICD-10-CM | POA: Diagnosis not present

## 2017-03-25 ENCOUNTER — Ambulatory Visit: Payer: 59 | Admitting: Allergy

## 2017-03-25 ENCOUNTER — Ambulatory Visit (INDEPENDENT_AMBULATORY_CARE_PROVIDER_SITE_OTHER): Payer: 59 | Admitting: *Deleted

## 2017-03-25 DIAGNOSIS — J4550 Severe persistent asthma, uncomplicated: Secondary | ICD-10-CM

## 2017-03-25 MED ORDER — BENRALIZUMAB 30 MG/ML ~~LOC~~ SOSY
30.0000 mg | PREFILLED_SYRINGE | SUBCUTANEOUS | Status: DC
  Administered 2017-03-25 – 2018-10-04 (×11): 30 mg via SUBCUTANEOUS

## 2017-03-26 ENCOUNTER — Encounter: Payer: Self-pay | Admitting: Allergy

## 2017-03-26 ENCOUNTER — Ambulatory Visit (INDEPENDENT_AMBULATORY_CARE_PROVIDER_SITE_OTHER): Payer: 59 | Admitting: Allergy

## 2017-03-26 VITALS — BP 112/70 | HR 91 | Temp 98.3°F | Resp 17 | Ht 67.5 in | Wt 321.6 lb

## 2017-03-26 DIAGNOSIS — J4550 Severe persistent asthma, uncomplicated: Secondary | ICD-10-CM | POA: Diagnosis not present

## 2017-03-26 DIAGNOSIS — J3089 Other allergic rhinitis: Secondary | ICD-10-CM | POA: Diagnosis not present

## 2017-03-26 DIAGNOSIS — E291 Testicular hypofunction: Secondary | ICD-10-CM | POA: Diagnosis not present

## 2017-03-26 MED ORDER — BUDESONIDE-FORMOTEROL FUMARATE 80-4.5 MCG/ACT IN AERO: 2.0000 | INHALATION_SPRAY | Freq: Two times a day (BID) | RESPIRATORY_TRACT | 5 refills | Status: DC

## 2017-03-26 NOTE — Progress Notes (Signed)
Follow-up Note  RE: Don [name] MRN: [id number] DOB: [birth year] Date of Office Visit: 03/26/2017   History of present illness: Don [name] is a 51 y.o. male presenting today for follow-up of asthma.  He was last seen in the office on 10/30/16 by myself.   He has done well since last visit since starting on Fasenra.  He is now doing every 8 wk injections.  He denies any issues with the injections themselves.  He reports he is less SOB now and has been decreasing his dose of Symbicort as he has been doing much better.  He takes symbicort 1 puff daily at this time.  He is on singulair.  He did need to use albuterol last week when he was having some pleuritic type pain but that has resolved.  He states he may need to use albuterol 1-2 times a week on average which is much improved.  He denies any nasal or ocular nasal symptoms at this time and thus is not using any allergy medications at this time.  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    He is still actively trying to lose weight and has last 22lb thus far.  He is going to a weight loss specialist, [facility]   Review of systems: Review of Systems  Constitutional: Positive for weight loss. Negative for chills, fever and malaise/fatigue.  HENT: Negative for congestion, ear discharge, ear pain, nosebleeds, sinus pain, sore throat and tinnitus.   Eyes: Negative for discharge and redness.  Respiratory: Negative for cough, shortness of breath and wheezing.   Cardiovascular: Positive for chest pain (pleuritic).  Gastrointestinal: Negative for abdominal pain, constipation, diarrhea, heartburn, nausea and vomiting.  Musculoskeletal: Negative for joint pain and myalgias.  Skin: Negative for itching and rash.  Neurological: Negative for headaches.    All other systems negative unless noted above in HPI  Past medical/social/surgical/family history have been reviewed and are unchanged  unless specifically indicated below.  No changes  Medication List: Allergies as of 03/26/2017      Reactions   Lisinopril Other (See Comments)   Muscle aches and fatigue.       Medication List       Accurate as of 03/26/17  3:34 PM. Always use your most recent med list.          albuterol 108 (90 Base) MCG/ACT inhaler Commonly known as:  PROVENTIL HFA;VENTOLIN HFA Inhale 2 puffs into the lungs every 6 (six) hours as needed for shortness of breath.   amLODipine 10 MG tablet Commonly known as:  NORVASC Take 10 mg by mouth daily.   cetirizine 10 MG tablet Commonly known as:  ZYRTEC Take 1 tablet (10 mg total) by mouth daily.   diclofenac 50 MG EC tablet Commonly known as:  VOLTAREN Take 50 mg by mouth 2 (two) times daily.   hydrochlorothiazide 12.5 MG tablet Commonly known as:  HYDRODIURIL Take 12.5 mg by mouth daily.   montelukast 10 MG tablet Commonly known as:  SINGULAIR Take 1 tablet (10 mg total) by mouth at bedtime.   sertraline 50 MG tablet Commonly known as:  ZOLOFT Take 50 mg by mouth daily.   valsartan 320 MG tablet Commonly known as:  DIOVAN Take 320 mg by mouth daily.       Known medication allergies: Allergies  Allergen Reactions  . Lisinopril Other (See Comments)    Muscle aches and fatigue.  Physical examination: Blood pressure 112/70, pulse 91, temperature 98.3 F (36.8 C), resp. rate 17, height 5' 7.5" (1.715 m), weight (!) 321 lb 9.6 oz (145.9 kg), SpO2 94 %.  General: Alert, interactive, in no acute distress, obese. HEENT: PERRLA, TMs pearly gray, turbinates minimally edematous without discharge, post-pharynx non erythematous. Neck: Supple without lymphadenopathy. Lungs: Clear to auscultation without wheezing, rhonchi or rales. {no increased work of breathing. CV: Normal S1, S2 without murmurs. Abdomen: Nondistended, nontender. Skin: Warm and dry, without lesions or rashes. Extremities:  No clubbing, cyanosis or edema. Neuro:    Grossly intact.  Diagnositics/Labs: Spirometry: FEV1: 3.11L  93%, FVC: 4.11L  101%, ratio consistent with nonobstructive pattern  Assessment and plan:   Asthma    - continued to have improved control     - continue taking Symbicort as you have been doing - 1 puffs daily.   If symptoms return go to back 2 puffs twice day.  Once you run out of Symbicort 160mcg start decreased dose of Symbicort 80mcg 1 puff daily (again if symptoms return take 2 puffs twice a day).   Will continue as we can to wean down to ICS only    - albuterol as needed every 4 hours for cough/wheeze/difficulty breathing    - continue Singulair 10mg  daily - take at bedtime    - continue Fasenra injection every 8 weeks  Allergic rhinitis    - continue allergen avoidance measures for dust mite    - continue Zyrtec 10mg  daily as needed    - continue Flonase 2 sprays each nostril as needed for congestion or drainage as needed  Follow-up 4-6  Months or sooner if needed    I appreciate the opportunity to take part in Don [name]'s care. Please do not hesitate to contact me with questions.  Sincerely,   Margo AyeShaylar Padgett, MD Allergy/Immunology [facility]

## 2017-03-26 NOTE — Patient Instructions (Addendum)
Asthma    - continued to have improved control     - continue taking Symbicort as you have been doing - 1 puffs daily.   If symptoms return go to back 2 puffs twice day.  Once you run out of Symbicort 160mcg start decreased dose of Symbicort 60mcg 1 puff daily (again if symptoms return take 2 puffs twice a day)    - albuterol as needed every 4 hours for cough/wheeze/difficulty breathing    - continue Singulair 10mg  daily - take at bedtime    - continue Fasenra injection every 8 weeks  Allergic rhinitis    - continue allergen avoidance measures for dust mite    - continue Zyrtec 10mg  daily as needed    - continue Flonase 2 sprays each nostril as needed for congestion or drainage as needed  Follow-up 4-6  Months or sooner if needed

## 2017-04-03 ENCOUNTER — Other Ambulatory Visit: Payer: Self-pay | Admitting: Allergy

## 2017-04-03 DIAGNOSIS — J4540 Moderate persistent asthma, uncomplicated: Secondary | ICD-10-CM

## 2017-04-09 DIAGNOSIS — E291 Testicular hypofunction: Secondary | ICD-10-CM | POA: Diagnosis not present

## 2017-04-16 DIAGNOSIS — I10 Essential (primary) hypertension: Secondary | ICD-10-CM | POA: Diagnosis not present

## 2017-04-16 DIAGNOSIS — E291 Testicular hypofunction: Secondary | ICD-10-CM | POA: Diagnosis not present

## 2017-04-23 DIAGNOSIS — I10 Essential (primary) hypertension: Secondary | ICD-10-CM | POA: Diagnosis not present

## 2017-04-23 DIAGNOSIS — R7303 Prediabetes: Secondary | ICD-10-CM | POA: Diagnosis not present

## 2017-05-17 DIAGNOSIS — J4550 Severe persistent asthma, uncomplicated: Secondary | ICD-10-CM | POA: Diagnosis not present

## 2017-05-20 ENCOUNTER — Ambulatory Visit (INDEPENDENT_AMBULATORY_CARE_PROVIDER_SITE_OTHER): Payer: 59 | Admitting: *Deleted

## 2017-05-20 DIAGNOSIS — J4550 Severe persistent asthma, uncomplicated: Secondary | ICD-10-CM

## 2017-06-11 ENCOUNTER — Ambulatory Visit (INDEPENDENT_AMBULATORY_CARE_PROVIDER_SITE_OTHER): Payer: 59

## 2017-06-11 ENCOUNTER — Encounter: Payer: Self-pay | Admitting: Podiatry

## 2017-06-11 ENCOUNTER — Ambulatory Visit (INDEPENDENT_AMBULATORY_CARE_PROVIDER_SITE_OTHER): Payer: 59 | Admitting: Podiatry

## 2017-06-11 ENCOUNTER — Other Ambulatory Visit: Payer: Self-pay | Admitting: Podiatry

## 2017-06-11 VITALS — BP 102/66 | HR 73

## 2017-06-11 DIAGNOSIS — M205X1 Other deformities of toe(s) (acquired), right foot: Secondary | ICD-10-CM

## 2017-06-11 DIAGNOSIS — M79672 Pain in left foot: Secondary | ICD-10-CM | POA: Diagnosis not present

## 2017-06-11 DIAGNOSIS — M79671 Pain in right foot: Secondary | ICD-10-CM | POA: Diagnosis not present

## 2017-06-11 DIAGNOSIS — M722 Plantar fascial fibromatosis: Secondary | ICD-10-CM

## 2017-06-11 MED ORDER — DICLOFENAC SODIUM 75 MG PO TBEC: 75.0000 mg | DELAYED_RELEASE_TABLET | Freq: Two times a day (BID) | ORAL | 2 refills | Status: DC

## 2017-06-11 MED ORDER — TRIAMCINOLONE ACETONIDE 10 MG/ML IJ SUSP
10.0000 mg | Freq: Once | INTRAMUSCULAR | Status: AC
  Administered 2017-06-11: 10 mg

## 2017-06-11 NOTE — Progress Notes (Signed)
   Subjective:    Patient ID: Don [name], male    DOB: 1965/12/20, 51 y.o.   MRN: [id number]  HPI  Chief Complaint  Patient presents with  . Bunions    Rt foot       Review of Systems  Musculoskeletal: Positive for arthralgias and gait problem.  All other systems reviewed and are negative.      Objective:   Physical Exam        Assessment & Plan:

## 2017-06-11 NOTE — Patient Instructions (Signed)

## 2017-06-11 NOTE — Progress Notes (Signed)
Subjective:    Patient ID: Don [name], male   DOB: 51 y.o.   MRN: [id number]   HPI patient presents stating scan a lot of pain in his big toe joint right and exquisite discomfort left plantar heel of shorter term duration. States the right been present for several years left is been getting worse over the last couple months    Review of Systems  All other systems reviewed and are negative.       Objective:  Physical Exam  Constitutional: He appears well-developed and well-nourished.  Cardiovascular: Intact distal pulses.   Pulmonary/Chest: Breath sounds normal.  Musculoskeletal: Normal range of motion.  Neurological: He is alert.  Skin: Skin is warm.  Nursing note and vitals reviewed.  neurovascular status intact muscle strength adequate range of motion within normal limits with patient found to have intense discomfort left plantar fashion insertional point tendon calcaneus and on the right is noted to have reduced range of motion mild crepitus with spur formation of the first MPJ with obesity as complicating factor. 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 I injected the left plantar fashion 3 Milligan Kenalog 5 mill grams Xylocaine and applied fascial brace with instructions on usage and discussed long-term for the right foot possibility of injection around the joint and that ultimately this will require spur excision with probable biplanar-type osteotomy. Patient is scheduled to be seen back in the next several weeks  X-ray indicates dorsal spur formation and narrowing of the joint with spur formation plantar heel

## 2017-06-25 ENCOUNTER — Ambulatory Visit: Payer: 59 | Admitting: Podiatry

## 2017-07-08 DIAGNOSIS — J4550 Severe persistent asthma, uncomplicated: Secondary | ICD-10-CM | POA: Diagnosis not present

## 2017-07-15 ENCOUNTER — Ambulatory Visit (INDEPENDENT_AMBULATORY_CARE_PROVIDER_SITE_OTHER): Payer: 59 | Admitting: *Deleted

## 2017-07-15 DIAGNOSIS — J4550 Severe persistent asthma, uncomplicated: Secondary | ICD-10-CM

## 2017-09-02 DIAGNOSIS — J4550 Severe persistent asthma, uncomplicated: Secondary | ICD-10-CM | POA: Diagnosis not present

## 2017-09-07 ENCOUNTER — Other Ambulatory Visit: Payer: Self-pay | Admitting: Allergy

## 2017-09-07 DIAGNOSIS — J4550 Severe persistent asthma, uncomplicated: Secondary | ICD-10-CM

## 2017-09-09 ENCOUNTER — Ambulatory Visit (INDEPENDENT_AMBULATORY_CARE_PROVIDER_SITE_OTHER): Payer: 59 | Admitting: *Deleted

## 2017-09-09 DIAGNOSIS — J4550 Severe persistent asthma, uncomplicated: Secondary | ICD-10-CM | POA: Diagnosis not present

## 2017-09-28 DIAGNOSIS — J45909 Unspecified asthma, uncomplicated: Secondary | ICD-10-CM | POA: Diagnosis not present

## 2017-09-28 DIAGNOSIS — J069 Acute upper respiratory infection, unspecified: Secondary | ICD-10-CM | POA: Diagnosis not present

## 2017-10-08 ENCOUNTER — Ambulatory Visit: Payer: 59 | Admitting: Allergy

## 2017-10-08 ENCOUNTER — Encounter: Payer: Self-pay | Admitting: Allergy

## 2017-10-08 VITALS — BP 146/96 | HR 92

## 2017-10-08 DIAGNOSIS — J4550 Severe persistent asthma, uncomplicated: Secondary | ICD-10-CM | POA: Diagnosis not present

## 2017-10-08 DIAGNOSIS — J3089 Other allergic rhinitis: Secondary | ICD-10-CM | POA: Diagnosis not present

## 2017-10-08 MED ORDER — IPRATROPIUM BROMIDE 0.06 % NA SOLN: 2.0000 | Freq: Three times a day (TID) | NASAL | 5 refills | Status: DC

## 2017-10-08 MED ORDER — ALBUTEROL SULFATE HFA 108 (90 BASE) MCG/ACT IN AERS: 2.0000 | INHALATION_SPRAY | Freq: Four times a day (QID) | RESPIRATORY_TRACT | 2 refills | Status: DC | PRN

## 2017-10-08 NOTE — Patient Instructions (Addendum)
Asthma    - recent asthma exacerbation     - continue taking Symbicort 160mcg 2 puffs twice day    - will provide with prednisone to take 30mg  twice a day for next 5 days.  If you feel improved prior to 5 days can stop when you feel improved.      - albuterol as needed every 4 hours for cough/wheeze/difficulty breathing    - continue Singulair 10mg  daily - take at bedtime    - continue Fasenra injection every 8 weeks  Allergic rhinitis    - continue allergen avoidance measures for dust mite    - continue Zyrtec 10mg  daily as needed    - continue Flonase 2 sprays each nostril as needed for congestion or drainage as needed  Follow-up 4-6  Months or sooner if needed

## 2017-10-08 NOTE — Progress Notes (Signed)
Follow-up Note  RE: Don [name] MRN: [id number] DOB: 1965-12-13 Date of Office Visit: 10/08/2017   History of present illness: Don CornerMichael [name] is a 52 y.o. male presenting today for follow-up of recent exacerbation. He was last seen in the office on 03/26/17 by myself.  He has history of severe persistent asthma on Fasenra every 8 wks and had exacerbation around the first of the year.  He states he was having SOB, productive cough, wheezing, nasal congestion worse when reclined.  He states he hasn't been this "sick" in a while and had been pleased that he had been doing well since the spring.  He went to [facility] where he was provided with steroid shot as well as 20mg  x 5 days course, zpak and recommend use of albuterol neb q4hr for several days.  He has completed the steroid and antibiotic and has resumed as needed use of albuterol.  He was also given nasal atrovent which he states helped to dry out his nose but he has also run out of this as well.  He denies any fevers or sick contacts.  He wonders if the change in weather had any effect on this flare up.  After his last visit he was doing well thus we stepped down his therapy to Symbicort 80 however with this flare it was recommended he go back up to symbicort 160 which he is currently doing 2 puffs twice a day.     Outside of this flare he denies any nasal congestion or drainage and has not needed to use zyrtec or flonase.     Review of systems: Review of Systems  Constitutional: Negative for chills, fever, malaise/fatigue and weight loss.  HENT: Positive for congestion. Negative for ear discharge, ear pain, nosebleeds, sinus pain and sore throat.   Eyes: Negative for pain, discharge and redness.  Respiratory: Positive for cough, sputum production, shortness of breath and wheezing.   Cardiovascular: Negative for chest pain.  Gastrointestinal: Negative for abdominal pain, constipation, diarrhea, heartburn, nausea and vomiting.  Musculoskeletal:  Negative for joint pain.  Skin: Negative for itching and rash.  Neurological: Negative for headaches.    All other systems negative unless noted above in HPI  Past medical/social/surgical/family history have been reviewed and are unchanged unless specifically indicated below.  No changes  Medication List: Allergies as of 10/08/2017      Reactions   Lisinopril Other (See Comments)   Muscle aches and fatigue.       Medication List        Accurate as of 10/08/17 12:14 PM. Always use your most recent med list.          albuterol 108 (90 Base) MCG/ACT inhaler Commonly known as:  PROVENTIL HFA;VENTOLIN HFA Inhale 2 puffs into the lungs every 6 (six) hours as needed for shortness of breath.   amLODipine 10 MG tablet Commonly known as:  NORVASC Take 10 mg by mouth daily.   diclofenac 50 MG EC tablet Commonly known as:  VOLTAREN Take 50 mg by mouth 2 (two) times daily.   diclofenac 75 MG EC tablet Commonly known as:  VOLTAREN Take 1 tablet (75 mg total) by mouth 2 (two) times daily.   hydrochlorothiazide 12.5 MG tablet Commonly known as:  HYDRODIURIL Take 12.5 mg by mouth daily.   ipratropium 0.06 % nasal spray Commonly known as:  ATROVENT Place 2 sprays into both nostrils 3 (three) times daily.   montelukast 10 MG tablet Commonly known as:  SINGULAIR Take  1 tablet (10 mg total) by mouth at bedtime.   sertraline 50 MG tablet Commonly known as:  ZOLOFT Take 50 mg by mouth daily.   SYMBICORT 80-4.5 MCG/ACT inhaler Generic drug:  budesonide-formoterol TAKE 2 PUFFS BY MOUTH TWICE A DAY   valsartan 320 MG tablet Commonly known as:  DIOVAN Take 320 mg by mouth daily.       Known medication allergies: Allergies  Allergen Reactions  . Lisinopril Other (See Comments)    Muscle aches and fatigue.      Physical examination:` Blood pressure (!) 146/96, pulse 92, SpO2 96 %.  General: Alert, interactive, in no acute distress, obese. HEENT: PERRLA, TMs pearly  gray, turbinates moderately edematous with clear discharge, post-pharynx non erythematous. Neck: Supple without lymphadenopathy. Lungs: Clear to auscultation without wheezing, rhonchi or rales. {no increased work of breathing. CV: Normal S1, S2 without murmurs. Abdomen: Nondistended, nontender. Skin: Warm and dry, without lesions or rashes. Extremities:  No clubbing, cyanosis or edema. Neuro:   Grossly intact.  Diagnositics/Labs:   Spirometry: FEV1: 2.93L  79%, FVC: 4.12L  88%, ratio consistent with nonobstructive pattern.  FEV1 is only very slighlty reduced.   Assessment and plan:   Asthma, severe persistent     - recent asthma exacerbation     - continue taking Symbicort 2 puffs twice day    - will provide with prednisone to take 30mg  twice a day for next 5 days.  If you feel improved prior to 5 days can stop when you feel improved.      - albuterol as needed every 4 hours for cough/wheeze/difficulty breathing    - continue Singulair 10mg  daily - take at bedtime    - continue Fasenra injection every 8 weeks.  If he continues to exacerbate will consider switching either to Nucala vs Dupixent  Allergic rhinitis    - continue allergen avoidance measures for dust mite    - continue Zyrtec 10mg  daily as needed    - continue Flonase 2 sprays each nostril as needed for congestion or drainage as needed  Follow-up 4-6  Months or sooner if needed   I appreciate the opportunity to take part in Ernestine's care. Please do not hesitate to contact me with questions.  Sincerely,   Margo Aye, MD Allergy/Immunology [facility]

## 2017-10-21 ENCOUNTER — Ambulatory Visit (HOSPITAL_COMMUNITY)
Admission: EM | Admit: 2017-10-21 | Discharge: 2017-10-21 | Disposition: A | Discharge status: Home / Self Care | Payer: Worker's Compensation | Attending: Family Medicine | Admitting: Family Medicine

## 2017-10-21 ENCOUNTER — Ambulatory Visit (INDEPENDENT_AMBULATORY_CARE_PROVIDER_SITE_OTHER): Payer: Worker's Compensation

## 2017-10-21 ENCOUNTER — Encounter (HOSPITAL_COMMUNITY): Payer: Self-pay | Admitting: Emergency Medicine

## 2017-10-21 DIAGNOSIS — M25562 Pain in left knee: Secondary | ICD-10-CM

## 2017-10-21 NOTE — ED Triage Notes (Signed)
PT struck left knee on equipment today and is not bearing weight on affected extremity.

## 2017-10-26 NOTE — ED Provider Notes (Signed)
[facility]   [id number] 10/21/17 Arrival Time: 1830  ASSESSMENT & PLAN:  1. Acute pain of left knee    Imaging: Dg Knee Complete 4 Views Left  Result Date: 10/21/2017 CLINICAL DATA:  Blunt trauma left knee with pain, initial encounter EXAM: LEFT KNEE - COMPLETE 4+ VIEW COMPARISON:  None. FINDINGS: No evidence of fracture, dislocation, or joint effusion. No evidence of arthropathy or other focal bone abnormality. Soft tissues are unremarkable. IMPRESSION: No acute abnormality noted. Electronically Signed   By: Alcide CleverMark  Lukens M.D.   On: 10/21/2017 19:25   Will f/u with Occupational Health; information given for him to call tomorrow morning.  OTC NSAID with food. Placed in knee immobilizer for comfort. Work note given.  Reviewed expectations re: course of current medical issues. Questions answered. Outlined signs and symptoms indicating need for more acute intervention. Patient verbalized understanding. After Visit Summary given.  SUBJECTIVE: History from: patient. [name] is a 52 y.o. male who reports:  Pain/Discomfort of: left knee Onset abrupt, today Frequency: intermittent Injury/trama: yes, reports hitting knee on piece of equipment at work Describes as: diffuse dull without radiation; mainly with weight bearing; no locking or giving out of knee Severity: mild to moderate Progression: stable Relieved by: not moving knee Worsened by: movement and weight bearing Associated symptoms: none reported Extremity sensation changes or weakness: none Self treatment: has not tried OTCs for relief of pain  History of similar: no  ROS: As per HPI.   OBJECTIVE:  Vitals:   10/21/17 1848 10/21/17 1850 10/21/17 1854  BP:   (!) 194/120  Pulse:  95   Resp:  18   Temp:  98 F (36.7 C)   SpO2:  96%   Weight: (!) 340 lb (154.2 kg)    Height: 5\' 8"  (1.727 m)      General appearance: alert; no distress Extremities: no cyanosis or edema; symmetrical with no gross  deformities; diffuse tenderness over his left knee with no swelling and no bruising; ROM: normal with reported discomfort; able to bear weight with more discomfort CV: normal extremity capillary refill Skin: warm and dry Neurologic: normal gait; normal symmetric reflexes in all extremities; normal sensation in all extremities Psychological: alert and cooperative; normal mood and affect  Allergies  Allergen Reactions  . Lisinopril Other (See Comments)    Muscle aches and fatigue.     Past Medical History:  Diagnosis Date  . Asthma   . Claustrophobia   . Depression   . Hypertension   . Kidney stone    YRS AGO   PASSED ON OWN    Social History   Socioeconomic History  . Marital status: Married    Spouse name: Not on file  . Number of children: 3  . Years of education: Not on file  . Highest education level: Not on file  Social Needs  . Financial resource strain: Not on file  . Food insecurity - worry: Not on file  . Food insecurity - inability: Not on file  . Transportation needs - medical: Not on file  . Transportation needs - non-medical: Not on file  Occupational History  . Occupation: Freight forwarderLandscaping    Employer: [employer]  Tobacco Use  . Smoking status: Former Games developermoker  . Smokeless tobacco: Never Used  Substance and Sexual Activity  . Alcohol use: Yes    Comment: SOCIAL   . Drug use: No  . Sexual activity: Not on file  Other Topics Concern  . Not on  file  Social History Narrative  . Not on file   Family History  Problem Relation Age of Onset  . Hypertension Mother   . Breast cancer Mother   . Diabetes Mother   . Hypertension Father   . Stroke Father   . Allergic rhinitis Neg Hx   . Angioedema Neg Hx   . Asthma Neg Hx   . Eczema Neg Hx   . Immunodeficiency Neg Hx   . Urticaria Neg Hx   . Atopy Neg Hx    Past Surgical History:  Procedure Laterality Date  . ANKLE SURGERY     RIGHT  . LASIK    . LUMBAR LAMINECTOMY/DECOMPRESSION MICRODISCECTOMY  Right 02/28/2013   Procedure: LUMBAR LAMINECTOMY/DECOMPRESSION MICRODISCECTOMY 1 LEVEL;  Surgeon: Maeola Harman, MD;  Location: [facility];  Service: Neurosurgery;  Laterality: Right;  Right Lumbar Four-Five Laminectomy  . MAXIMUM ACCESS (MAS)POSTERIOR LUMBAR INTERBODY FUSION (PLIF) 1 LEVEL N/A 04/23/2015   Procedure: L4-5 Maximum access posterior lumbar interbody fusion;  Surgeon: Maeola Harman, MD;  Location: [facility];  Service: Neurosurgery;  Laterality: N/A;  L4-5 Maximum access posterior lumbar interbody fusion  . RADIOLOGY WITH ANESTHESIA N/A 02/05/2015   Procedure: MRI - Lumbar Spine;  Surgeon: Medication Radiologist, MD;  Location: [facility] OR;  Service: Radiology;  Laterality: N/A;  . TONSILLECTOMY     AS CHILD        Mardella Layman, MD 10/26/17 [phone]

## 2017-10-29 DIAGNOSIS — J4550 Severe persistent asthma, uncomplicated: Secondary | ICD-10-CM | POA: Diagnosis not present

## 2017-11-05 ENCOUNTER — Ambulatory Visit: Payer: 59 | Admitting: Allergy

## 2017-11-05 ENCOUNTER — Encounter: Payer: Self-pay | Admitting: Allergy

## 2017-11-05 ENCOUNTER — Ambulatory Visit: Payer: 59

## 2017-11-05 VITALS — BP 136/94 | HR 99 | Ht 67.5 in | Wt 349.6 lb

## 2017-11-05 DIAGNOSIS — J4550 Severe persistent asthma, uncomplicated: Secondary | ICD-10-CM

## 2017-11-05 DIAGNOSIS — J3089 Other allergic rhinitis: Secondary | ICD-10-CM

## 2017-11-05 MED ORDER — FLUTICASONE FUROATE-VILANTEROL 200-25 MCG/INH IN AEPB: 1.0000 | INHALATION_SPRAY | Freq: Every day | RESPIRATORY_TRACT | 3 refills | Status: DC

## 2017-11-05 MED ORDER — ALBUTEROL SULFATE HFA 108 (90 BASE) MCG/ACT IN AERS
2.0000 | INHALATION_SPRAY | RESPIRATORY_TRACT | 3 refills | Status: DC | PRN
Start: 2017-11-05 — End: 2018-12-14

## 2017-11-05 MED ORDER — MONTELUKAST SODIUM 10 MG PO TABS: 10.0000 mg | ORAL_TABLET | Freq: Every day | ORAL | 3 refills | Status: DC

## 2017-11-05 MED ORDER — IPRATROPIUM-ALBUTEROL 0.5-2.5 (3) MG/3ML IN SOLN: 3.0000 mL | Freq: Four times a day (QID) | RESPIRATORY_TRACT | 5 refills | Status: DC | PRN

## 2017-11-05 NOTE — Patient Instructions (Addendum)
Asthma    - not well controlled at this time    - will change Symbicort to Breo 200 mcg take 1 puff once a day    - resume use of Singulair 10mg  daily - take at bedtime    - albuterol as needed every 4 hours for cough/wheeze/difficulty      breathing.  Will prescribe Proair.      - Will start approval process for Dupixent as appears Harrington ChallengerFasenra is not controlling symptoms as we expect.  At this time continue Fasenra injection every 8 weeks until able to change to Dupixent.  Information on Dupixent for asthma control provided today.      - continue working on weight loss as this will help improve lung functions.    Allergic rhinitis    - continue allergen avoidance measures for dust mite    - continue Zyrtec 10mg  daily as needed    - continue Flonase 2 sprays each nostril as needed for congestion or drainage as needed  Follow-up 3-4 months or sooner if needed

## 2017-11-05 NOTE — Progress Notes (Signed)
Follow-up Note  RE: Don [name] MRN: [id number] DOB: [birth year] Date of Office Visit: 11/05/2017   History of present illness: Don [name] is a 52 y.o. male presenting today for follow-up of asthma.  He was last seen in the office on 10/08/17 for asthma exacerbation treated with prednisone course.  He states he started to feel better and did not need to complete the entire course.  He states he started to feel bad again about 2-3 weeks ago with increased SOB.  He states he did stop his singulair around 2-3 weeks ago as he states he was feeling well.  He does report he is still taking symbicort 2 puffs twice a day which he feels is not working.  He also states using Proventil around 2-3 times a day and does not feel he is getting relief.  He is on Fasenra now at every 8 weeks.  He has gained 20lbs since last summer.   With his allergies he denies any significant nasal or ocular symptoms at this time and takes zyrtec and uses flonase both as needed.    Review of systems: Review of Systems  Constitutional: Negative for chills, fever, malaise/fatigue and weight loss.  HENT: Negative for congestion, ear discharge, ear pain, nosebleeds, sinus pain and sore throat.   Eyes: Negative for pain, discharge and redness.  Respiratory: Positive for cough and shortness of breath.   Cardiovascular: Negative for chest pain.  Gastrointestinal: Negative for abdominal pain, heartburn, nausea and vomiting.  Musculoskeletal: Negative for joint pain.  Skin: Negative for itching and rash.  Neurological: Negative for headaches.    All other systems negative unless noted above in HPI  Past medical/social/surgical/family history have been reviewed and are unchanged unless specifically indicated below.  No changes  Medication List: Allergies as of 11/05/2017      Reactions   Lisinopril Other (See Comments)   Muscle aches and fatigue.       Medication List        Accurate as of 11/05/17  2:02 PM. Always  use your most recent med list.          albuterol 108 (90 Base) MCG/ACT inhaler Commonly known as:  PROVENTIL HFA;VENTOLIN HFA Inhale 2 puffs into the lungs every 6 (six) hours as needed for shortness of breath.   albuterol 108 (90 Base) MCG/ACT inhaler Commonly known as:  PROAIR HFA Inhale 2 puffs into the lungs every 4 (four) hours as needed for wheezing or shortness of breath.   amLODipine 10 MG tablet Commonly known as:  NORVASC Take 10 mg by mouth daily.   diclofenac 50 MG EC tablet Commonly known as:  VOLTAREN Take 50 mg by mouth 2 (two) times daily.   diclofenac 75 MG EC tablet Commonly known as:  VOLTAREN Take 1 tablet (75 mg total) by mouth 2 (two) times daily.   fluticasone furoate-vilanterol 200-25 MCG/INH Aepb Commonly known as:  BREO ELLIPTA Inhale 1 puff into the lungs daily.   hydrochlorothiazide 12.5 MG tablet Commonly known as:  HYDRODIURIL Take 12.5 mg by mouth daily.   ipratropium 0.06 % nasal spray Commonly known as:  ATROVENT Place 2 sprays into both nostrils 3 (three) times daily.   ipratropium-albuterol 0.5-2.5 (3) MG/3ML Soln Commonly known as:  DUONEB Take 3 mLs by nebulization every 6 (six) hours as needed.   montelukast 10 MG tablet Commonly known as:  SINGULAIR Take 1 tablet (10 mg total) by mouth at bedtime.   montelukast 10 MG tablet Commonly  known as:  SINGULAIR Take 1 tablet (10 mg total) by mouth at bedtime.   sertraline 50 MG tablet Commonly known as:  ZOLOFT Take 50 mg by mouth daily.   SYMBICORT 80-4.5 MCG/ACT inhaler Generic drug:  budesonide-formoterol TAKE 2 PUFFS BY MOUTH TWICE A DAY   valsartan 320 MG tablet Commonly known as:  DIOVAN Take 320 mg by mouth daily.       Known medication allergies: Allergies  Allergen Reactions  . Lisinopril Other (See Comments)    Muscle aches and fatigue.      Physical examination: Blood pressure (!) 136/94, pulse 99, height 5' 7.5" (1.715 m), weight (!) 349 lb 9.6 oz  (158.6 kg), SpO2 93 %.  General: Alert, interactive, in no acute distress, obese. HEENT: PERRLA, TMs pearly gray, turbinates minimally edematous without discharge, post-pharynx non erythematous. Neck: Supple without lymphadenopathy. Lungs: Mildly decreased breath sounds bilaterally without wheezing, rhonchi or rales due to body habitus. {no increased work of breathing. CV: Normal S1, S2 without murmurs. Abdomen: Nondistended, nontender. Skin: Warm and dry, without lesions or rashes. Extremities:  No clubbing, cyanosis or edema. Neuro:   Grossly intact.  Diagnositics/Labs:  Spirometry: FEV1: 1.85L  54%, FVC: 2.13L  50% c/w restrictive pattern.  No significant improvement s/p duoneb.    Assessment and plan:   Asthma, severe persistent    - not well controlled at this time    - will change Symbicort to Breo 200 mcg take 1 puff once a day    - resume use of Singulair 10mg  daily - take at bedtime    - albuterol as needed every 4 hours for cough/wheeze/difficulty breathing.  Will prescribe Proair.      - Will start approval process for Dupixent as appears Harrington Challenger is not controlling symptoms as we expect.  At this time continue Fasenra injection every 8 weeks until able to change to Dupixent.  Information on Dupixent for asthma control provided today.      - continue working on weight loss as this will help improve lung functions.    Allergic rhinitis    - continue allergen avoidance measures for dust mite    - continue Zyrtec 10mg  daily as needed    - continue Flonase 2 sprays each nostril as needed for congestion or drainage as needed  Follow-up 3-4 months or sooner if needed    I appreciate the opportunity to take part in Keldrick's care. Please do not hesitate to contact me with questions.  Sincerely,   Margo Aye, MD Allergy/Immunology [facility]

## 2017-11-12 ENCOUNTER — Ambulatory Visit: Payer: 59 | Admitting: Allergy

## 2017-11-16 DIAGNOSIS — R7303 Prediabetes: Secondary | ICD-10-CM | POA: Diagnosis not present

## 2017-11-16 DIAGNOSIS — J069 Acute upper respiratory infection, unspecified: Secondary | ICD-10-CM | POA: Diagnosis not present

## 2017-11-16 DIAGNOSIS — I10 Essential (primary) hypertension: Secondary | ICD-10-CM | POA: Diagnosis not present

## 2017-12-16 ENCOUNTER — Telehealth: Payer: Self-pay | Admitting: *Deleted

## 2017-12-16 NOTE — Telephone Encounter (Signed)
Patient called on Monday Re: biologic change from TaftFasenra to Dupixent.  He advised that Dr Delorse LekPadgett whrn seen in Jan she mentioned changing him and he had heard nothing.  I consulted Dr Delorse LekPadgett and she advised she did give him info but was waiting to see if he had really failed Fasenra.  Per Dr Delorse LekPadgett if patient feels he is stil uncontrolled on current therapy we can change him to Dupixent.  I tried to contact patient today but no voicemail set up. Will attempt again later.

## 2017-12-21 NOTE — Telephone Encounter (Signed)
Tried to call patient again. No voicemail set up

## 2017-12-27 NOTE — Telephone Encounter (Signed)
Wife called to follow up , I informed her that [name] is in our [place] office and gave her the direct line to have her husband call her back.

## 2017-12-27 NOTE — Telephone Encounter (Signed)
I did discuss with patient whether he feels he has failed Fasenra and wants to change. He advised at this time he is doing well and will keep with current therapy instead of changing to Dupixent.

## 2017-12-28 NOTE — Telephone Encounter (Signed)
Thanks so much Tammy.   Will let you know if/when will change to a different biologic agent

## 2017-12-30 ENCOUNTER — Ambulatory Visit (INDEPENDENT_AMBULATORY_CARE_PROVIDER_SITE_OTHER): Payer: 59 | Admitting: *Deleted

## 2017-12-30 DIAGNOSIS — J4550 Severe persistent asthma, uncomplicated: Secondary | ICD-10-CM

## 2017-12-31 ENCOUNTER — Other Ambulatory Visit: Payer: Self-pay | Admitting: *Deleted

## 2017-12-31 ENCOUNTER — Ambulatory Visit: Payer: Self-pay

## 2017-12-31 MED ORDER — BENRALIZUMAB 30 MG/ML ~~LOC~~ SOSY: 30.0000 mg | PREFILLED_SYRINGE | SUBCUTANEOUS | 6 refills | Status: DC

## 2018-01-03 DIAGNOSIS — J4550 Severe persistent asthma, uncomplicated: Secondary | ICD-10-CM | POA: Diagnosis not present

## 2018-01-12 ENCOUNTER — Encounter: Payer: Self-pay | Admitting: Adult Health

## 2018-01-13 DIAGNOSIS — G4733 Obstructive sleep apnea (adult) (pediatric): Secondary | ICD-10-CM | POA: Diagnosis not present

## 2018-01-28 DIAGNOSIS — G4733 Obstructive sleep apnea (adult) (pediatric): Secondary | ICD-10-CM | POA: Diagnosis not present

## 2018-02-03 DIAGNOSIS — M7989 Other specified soft tissue disorders: Secondary | ICD-10-CM | POA: Diagnosis not present

## 2018-02-04 ENCOUNTER — Encounter: Payer: Self-pay | Admitting: Allergy

## 2018-02-04 ENCOUNTER — Ambulatory Visit: Payer: 59 | Admitting: Allergy

## 2018-02-04 VITALS — BP 122/74 | HR 88 | Resp 20

## 2018-02-04 DIAGNOSIS — J383 Other diseases of vocal cords: Secondary | ICD-10-CM | POA: Diagnosis not present

## 2018-02-04 DIAGNOSIS — J4550 Severe persistent asthma, uncomplicated: Secondary | ICD-10-CM | POA: Diagnosis not present

## 2018-02-04 DIAGNOSIS — J3089 Other allergic rhinitis: Secondary | ICD-10-CM | POA: Diagnosis not present

## 2018-02-04 DIAGNOSIS — M7989 Other specified soft tissue disorders: Secondary | ICD-10-CM | POA: Diagnosis not present

## 2018-02-04 DIAGNOSIS — R0602 Shortness of breath: Secondary | ICD-10-CM

## 2018-02-04 NOTE — Progress Notes (Signed)
Follow-up Note  RE: Don [name] MRN: [id number] DOB: 1966/08/28 Date of Office Visit: 02/04/2018   History of present illness: Don [name] is a 52 y.o. male presenting today for follow-up of asthma and allergies.  He was last seen in the office on 11/05/17 by myself.  He denies any major health changes, surgeries or hospitalizations.  However he does state recently he developed left leg swelling which he thought was related to a knee injury he sustained earlier in the year.  He denies any leg pain or claudication.  He saw his PCP recently and he states he had a leg [place] that was negative for DVT but did show a Baker's cyst and he was prescribed a medication which he does not recall the name to help with the swelling.  He has not picked this medication up but plans to today.  He states he has continued to have SOB mostly when he is walking and is not sure if Harrington Challenger is working.  He states the first 3 months of Harrington Challenger worked very well but when he went to every 8 week injections does not feel it is as effective as it was during the loading dose phase.  He feels the SOB is coming from his throat but denies throat clearing.  He does endorse some hoarseness at time.  He denies any chest pain.  From an allergy standpoint he states he is doing well without any symptoms and has not needed to use either zyrtec or flonase.    Review of systems: Review of Systems  Constitutional: Negative for chills, fever, malaise/fatigue and weight loss.  HENT: Negative for congestion, ear discharge, ear pain, nosebleeds, sinus pain and sore throat.   Eyes: Negative for pain, discharge and redness.  Respiratory: Positive for shortness of breath. Negative for cough, sputum production and wheezing.   Cardiovascular: Positive for leg swelling. Negative for chest pain, palpitations and claudication.  Gastrointestinal: Negative for abdominal pain, constipation, diarrhea, heartburn, nausea and vomiting.  Musculoskeletal:  Negative for joint pain.  Skin: Negative for itching and rash.  Neurological: Negative for headaches.    All other systems negative unless noted above in HPI  Past medical/social/surgical/family history have been reviewed and are unchanged unless specifically indicated below.  No changes  Medication List: Allergies as of 02/04/2018      Reactions   Lisinopril Other (See Comments)   Muscle aches and fatigue.       Medication List        Accurate as of 02/04/18 12:25 PM. Always use your most recent med list.          albuterol 108 (90 Base) MCG/ACT inhaler Commonly known as:  PROVENTIL HFA;VENTOLIN HFA Inhale 2 puffs into the lungs every 6 (six) hours as needed for shortness of breath.   albuterol 108 (90 Base) MCG/ACT inhaler Commonly known as:  PROAIR HFA Inhale 2 puffs into the lungs every 4 (four) hours as needed for wheezing or shortness of breath.   amLODipine 10 MG tablet Commonly known as:  NORVASC Take 10 mg by mouth daily.   Benralizumab 30 MG/ML Sosy Commonly known as:  FASENRA Inject 30 mg into the skin every 8 (eight) weeks.   diclofenac 50 MG EC tablet Commonly known as:  VOLTAREN Take 50 mg by mouth 2 (two) times daily.   diclofenac 75 MG EC tablet Commonly known as:  VOLTAREN Take 1 tablet (75 mg total) by mouth 2 (two) times daily.   fluticasone furoate-vilanterol  200-25 MCG/INH Aepb Commonly known as:  BREO ELLIPTA Inhale 1 puff into the lungs daily.   hydrochlorothiazide 12.5 MG tablet Commonly known as:  HYDRODIURIL Take 12.5 mg by mouth daily.   ipratropium 0.06 % nasal spray Commonly known as:  ATROVENT Place 2 sprays into both nostrils 3 (three) times daily.   ipratropium-albuterol 0.5-2.5 (3) MG/3ML Soln Commonly known as:  DUONEB Take 3 mLs by nebulization every 6 (six) hours as needed.   montelukast 10 MG tablet Commonly known as:  SINGULAIR Take 1 tablet (10 mg total) by mouth at bedtime.   sertraline 50 MG tablet Commonly  known as:  ZOLOFT Take 50 mg by mouth daily.   SYMBICORT 80-4.5 MCG/ACT inhaler Generic drug:  budesonide-formoterol TAKE 2 PUFFS BY MOUTH TWICE A DAY   valsartan 320 MG tablet Commonly known as:  DIOVAN Take 320 mg by mouth daily.       Known medication allergies: Allergies  Allergen Reactions  . Lisinopril Other (See Comments)    Muscle aches and fatigue.      Physical examination: Blood pressure 122/74, pulse 88, resp. rate 20, SpO2 95 %.  General: Alert, interactive, in no acute distress, obese. HEENT: PERRLA, TMs pearly gray, turbinates minimally edematous without discharge, post-pharynx non erythematous. Neck: Supple without lymphadenopathy. Lungs: Clear to auscultation without wheezing, rhonchi or rales. {no increased work of breathing. CV: Normal S1, S2 without murmurs. Abdomen: Nondistended, nontender. Skin: Warm and dry, without lesions or rashes. Extremities:  No clubbing, cyanosis or edema.  No visible peripheral edema on exam.  Neuro:   Grossly intact.  Diagnositics/Labs: Spirometry: FEV1: 2.93L 86%, FVC: 3.66L 86%, ratio consistent with nonobstructive pattern  Assessment and plan:   Asthma, severe persistent    - lung function testing is normal today    -  Breo 200 mcg take 1 puff once a day    -  use of Singulair  daily - take at bedtime    - albuterol as needed every 4 hours for cough/wheeze/difficulty breathing.      - continue Fasenra for now.  He has not required oral steroids/ED/UC visit since last visit for his asthma and from my perspective has been better controlled on Fasenra vs prior to starting anti-IL5 medication.  I do believe most of his SOB symptoms is not respiratory in nature.   Will await results of ENT evaluation.     - continue working on weight loss as this will help improve lung function and symptoms.  I do believe there is a degree of deconditioning as well.      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  He did start laughing during encounter and appeared SOB at that time.  It is possible that he has asthma with VCD.  VCD can cause symptoms of feeling SOB and cough, hoarsness can be symptoms associated with VCD.  We did discuss pursed lip breathing technique to employ when he is feeling SOB.      Leg swelling and SOB   - currently being evaluated by PCP.  Negative [place] for DVT per pt.  Advised pt to start the medication prescribed by PCP.    - at this time he does  not have any acute symptoms.  His SOB with activity is not a new symptom and he denies chest pain.  However concerned that his SOB may be more cardiac in nature vs possible VCD as above vs deconditioning due to obesity.   He states he had a cardiac evaluation approx. 2 years ago.  Will defer cardiac w/u to PCP who he will be seeing in next weeks to f/u his leg swelling.      Follow-up 3-4 months or sooner if needed   I appreciate the opportunity to take part in Don [name]'s care. Please do not hesitate to contact me with questions.  Sincerely,   Margo Aye, MD Allergy/Immunology [facility]

## 2018-02-04 NOTE — Patient Instructions (Addendum)
Asthma    - lung function testing is normal today    -  Breo 200 mcg take 1 puff once a day    -  use of Singulair  daily - take at bedtime    - albuterol as needed every 4 hours for cough/wheeze/difficulty      breathing.      - continue Fasenra for now.   Will await results of ENT evaluation    - continue working on weight loss as this will help improve lung functions.    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  He did start laughing during encounter and appeared SOB at that time.  It is possible that he has asthma with VCD.  VCD can cause symptoms of feeling SOB and cough, hoarsness can be symptoms associated with VCD.    Leg swelling and SOB   - currently being evaluated by PCP.  Negative [place] for DVT per pt.  Advised pt to start the medication prescribed by PCP.    - his SOB with activity is not a new symptom and he denies chest pain.  However concerned that his SOB may be more cardiac in nature vs possible VCD as above vs deconditioning due to obesity.   He states he had a cardiac evaluation approx. 2 years ago.  Will defer cardiac w/u to PCP who he will be seeing in next weeks to f/u his leg swelling.      Follow-up 3-4 months or sooner if needed

## 2018-02-07 NOTE — Addendum Note (Signed)
Addended by: Dub Mikes on: 02/07/2018 08:11 AM   Modules accepted: Orders

## 2018-02-10 ENCOUNTER — Telehealth: Payer: Self-pay

## 2018-02-10 NOTE — Telephone Encounter (Signed)
Great - thanks

## 2018-02-10 NOTE — Telephone Encounter (Signed)
Referral has been placed to Dr.Teoh. 

## 2018-02-10 NOTE — Telephone Encounter (Signed)
-----   Message from Raul Del sent at 02/07/2018 10:44 AM EDT ----- Regarding: FW: ent referral   ----- Message ----- From: Marcelyn Bruins, MD Sent: 02/04/2018  12:35 PM To: Marin Roberts Neal-Mims, NT, # Subject: ent referral                                   Please place an ENT referral with nasal endoscopy re concern for VCD in pt with asthma.  Thanks.

## 2018-02-16 DIAGNOSIS — R7303 Prediabetes: Secondary | ICD-10-CM | POA: Diagnosis not present

## 2018-02-16 DIAGNOSIS — R600 Localized edema: Secondary | ICD-10-CM | POA: Diagnosis not present

## 2018-02-16 DIAGNOSIS — I10 Essential (primary) hypertension: Secondary | ICD-10-CM | POA: Diagnosis not present

## 2018-02-18 NOTE — Telephone Encounter (Signed)
Dr Luther Hearing office tried to call the patient. Patient has been notified with the phone number.

## 2018-02-24 ENCOUNTER — Ambulatory Visit (INDEPENDENT_AMBULATORY_CARE_PROVIDER_SITE_OTHER): Payer: 59 | Admitting: *Deleted

## 2018-02-24 DIAGNOSIS — J4550 Severe persistent asthma, uncomplicated: Secondary | ICD-10-CM | POA: Diagnosis not present

## 2018-03-04 DIAGNOSIS — K921 Melena: Secondary | ICD-10-CM | POA: Diagnosis not present

## 2018-03-04 DIAGNOSIS — Z1211 Encounter for screening for malignant neoplasm of colon: Secondary | ICD-10-CM | POA: Diagnosis not present

## 2018-03-04 DIAGNOSIS — I10 Essential (primary) hypertension: Secondary | ICD-10-CM | POA: Diagnosis not present

## 2018-03-04 DIAGNOSIS — H9202 Otalgia, left ear: Secondary | ICD-10-CM | POA: Diagnosis not present

## 2018-03-23 DIAGNOSIS — J342 Deviated nasal septum: Secondary | ICD-10-CM | POA: Diagnosis not present

## 2018-03-23 DIAGNOSIS — J343 Hypertrophy of nasal turbinates: Secondary | ICD-10-CM | POA: Diagnosis not present

## 2018-03-23 DIAGNOSIS — J310 Chronic rhinitis: Secondary | ICD-10-CM | POA: Diagnosis not present

## 2018-03-30 ENCOUNTER — Other Ambulatory Visit: Payer: Self-pay | Admitting: Allergy

## 2018-03-30 DIAGNOSIS — J4550 Severe persistent asthma, uncomplicated: Secondary | ICD-10-CM

## 2018-03-30 NOTE — Telephone Encounter (Signed)
RF on Breo x 1 with 3 refills at [facility]

## 2018-04-14 DIAGNOSIS — J4550 Severe persistent asthma, uncomplicated: Secondary | ICD-10-CM | POA: Diagnosis not present

## 2018-04-21 ENCOUNTER — Ambulatory Visit (INDEPENDENT_AMBULATORY_CARE_PROVIDER_SITE_OTHER): Payer: 59 | Admitting: *Deleted

## 2018-04-21 DIAGNOSIS — J4550 Severe persistent asthma, uncomplicated: Secondary | ICD-10-CM

## 2018-05-13 ENCOUNTER — Ambulatory Visit: Payer: 59 | Admitting: Allergy

## 2018-06-03 DIAGNOSIS — Z13220 Encounter for screening for lipoid disorders: Secondary | ICD-10-CM | POA: Diagnosis not present

## 2018-06-03 DIAGNOSIS — R7303 Prediabetes: Secondary | ICD-10-CM | POA: Diagnosis not present

## 2018-06-03 DIAGNOSIS — I10 Essential (primary) hypertension: Secondary | ICD-10-CM | POA: Diagnosis not present

## 2018-06-16 ENCOUNTER — Ambulatory Visit (INDEPENDENT_AMBULATORY_CARE_PROVIDER_SITE_OTHER): Payer: 59 | Admitting: *Deleted

## 2018-06-16 DIAGNOSIS — J4550 Severe persistent asthma, uncomplicated: Secondary | ICD-10-CM

## 2018-07-26 ENCOUNTER — Other Ambulatory Visit: Payer: Self-pay | Admitting: Allergy

## 2018-07-26 DIAGNOSIS — J4550 Severe persistent asthma, uncomplicated: Secondary | ICD-10-CM

## 2018-07-29 DIAGNOSIS — J4550 Severe persistent asthma, uncomplicated: Secondary | ICD-10-CM | POA: Diagnosis not present

## 2018-08-11 ENCOUNTER — Ambulatory Visit (INDEPENDENT_AMBULATORY_CARE_PROVIDER_SITE_OTHER): Payer: 59 | Admitting: *Deleted

## 2018-08-11 DIAGNOSIS — J4550 Severe persistent asthma, uncomplicated: Secondary | ICD-10-CM

## 2018-08-17 DIAGNOSIS — J069 Acute upper respiratory infection, unspecified: Secondary | ICD-10-CM | POA: Diagnosis not present

## 2018-08-17 DIAGNOSIS — J209 Acute bronchitis, unspecified: Secondary | ICD-10-CM | POA: Diagnosis not present

## 2018-08-21 ENCOUNTER — Other Ambulatory Visit: Payer: Self-pay | Admitting: Allergy

## 2018-08-21 DIAGNOSIS — J4550 Severe persistent asthma, uncomplicated: Secondary | ICD-10-CM

## 2018-08-22 NOTE — Telephone Encounter (Signed)
Courtesy refill  

## 2018-09-13 ENCOUNTER — Other Ambulatory Visit: Payer: Self-pay | Admitting: Allergy

## 2018-09-13 ENCOUNTER — Telehealth: Payer: Self-pay | Admitting: Allergy

## 2018-09-13 DIAGNOSIS — J4550 Severe persistent asthma, uncomplicated: Secondary | ICD-10-CM

## 2018-09-13 MED ORDER — FLUTICASONE FUROATE-VILANTEROL 200-25 MCG/INH IN AEPB: 1.0000 | INHALATION_SPRAY | Freq: Every day | RESPIRATORY_TRACT | 0 refills | Status: DC

## 2018-09-13 NOTE — Telephone Encounter (Signed)
Pt called and made appointment for jan 17 and needs to have Breo called into [facility] [address] [phone].

## 2018-09-13 NOTE — Telephone Encounter (Signed)
Called and spoke with patients wife and informed her that I could send in one refill and that patient would need to keep his appt for any further refills.

## 2018-09-26 DIAGNOSIS — J4550 Severe persistent asthma, uncomplicated: Secondary | ICD-10-CM | POA: Diagnosis not present

## 2018-10-04 ENCOUNTER — Ambulatory Visit: Payer: Self-pay

## 2018-10-04 ENCOUNTER — Ambulatory Visit (INDEPENDENT_AMBULATORY_CARE_PROVIDER_SITE_OTHER): Payer: 59 | Admitting: Allergy and Immunology

## 2018-10-04 ENCOUNTER — Encounter: Payer: Self-pay | Admitting: Allergy and Immunology

## 2018-10-04 VITALS — BP 122/86 | HR 95 | Resp 16 | Ht 68.0 in | Wt 345.0 lb

## 2018-10-04 DIAGNOSIS — J4551 Severe persistent asthma with (acute) exacerbation: Secondary | ICD-10-CM | POA: Diagnosis not present

## 2018-10-04 DIAGNOSIS — D721 Eosinophilia, unspecified: Secondary | ICD-10-CM

## 2018-10-04 DIAGNOSIS — J3089 Other allergic rhinitis: Secondary | ICD-10-CM

## 2018-10-04 DIAGNOSIS — J4550 Severe persistent asthma, uncomplicated: Secondary | ICD-10-CM

## 2018-10-04 MED ORDER — METHYLPREDNISOLONE ACETATE 80 MG/ML IJ SUSP
80.0000 mg | Freq: Once | INTRAMUSCULAR | Status: AC
  Administered 2018-10-04: 80 mg via INTRAMUSCULAR

## 2018-10-04 NOTE — Patient Instructions (Addendum)
  1.  Depo-Medrol 80 IM delivered in clinic today.  Prednisone 20 mg daily for 10 days  2.  Every day utilize the following medications:   A.  Breo 200 -1 inhalation 1 time per day  B. Incruse -1 inhalation 1 time per day  C.  Flonase -1 spray each nostril 1 time per day  D.  Montelukast 10 mg -1 tablet 1 time per day  3.  Continue benralizumab injections for now.  Consider changing to mepolizumab injections  4.  If needed:   A.  Albuterol nebulization or MDI 2 inhalations every 4-6 hours  B.  OTC antihistamine  C.  Nasal saline  5.  Return to clinic in 2 weeks or earlier if problem  6. Check CBC w/diff

## 2018-10-04 NOTE — Progress Notes (Signed)
Follow-up Note  Referring Provider: Darrow BussingKoirala, Dibas, MD Primary Provider: Darrow BussingKoirala, Dibas, MD Date of Office Visit: 10/04/2018  Subjective:   Don [name] (DOB: 1966-05-07) is a 53 y.o. male [facility] in re-evaluation of the following:  HPI: Don [name] presents to this clinic in evaluation of asthma.  His last visit to this clinic was with Dr. Delorse LekPadgett on 04 Feb 2018.  He states that since Thanksgiving he has been having recurrent issues with wheezing and coughing and using his bronchodilator on a pretty consistent basis.  7 days ago he developed nasal congestion and sneezing and clear rhinorrhea without any fever or ugly nasal discharge.  He was around contacts with a similar type of issue.  Ever since then he just cannot breathe very well and is constantly coughing and wheezing.  He did go to the urgent care center around Thanksgiving and received a systemic steroid.  He has been treated with benralizumab and he does not think that this agent is as effective when he uses it every 2 months then when he uses it every month.  Initially when he started on benralizumab he had almost complete control of all of his respiratory tract issues.  He does consistently use Breo and Singulair.  As well, his nose is doing well prior to this event while using Flonase on a pretty consistent basis.  Allergies as of 10/04/2018      Reactions   Lisinopril Other (See Comments)   Muscle aches and fatigue.       Medication List      albuterol 108 (90 Base) MCG/ACT inhaler Commonly known as:  PROVENTIL HFA;VENTOLIN HFA Inhale 2 puffs into the lungs every 6 (six) hours as needed for shortness of breath.   albuterol 108 (90 Base) MCG/ACT inhaler Commonly known as:  PROAIR HFA Inhale 2 puffs into the lungs every 4 (four) hours as needed for wheezing or shortness of breath.   amLODipine 10 MG tablet Commonly known as:  NORVASC Take 10 mg by mouth daily.     Benralizumab 30 MG/ML Sosy Commonly known as:  FASENRA Inject 30 mg into the skin every 8 (eight) weeks.   diclofenac 50 MG EC tablet Commonly known as:  VOLTAREN Take 50 mg by mouth 2 (two) times daily.   diclofenac 75 MG EC tablet Commonly known as:  VOLTAREN Take 1 tablet (75 mg total) by mouth 2 (two) times daily.   fluticasone furoate-vilanterol 200-25 MCG/INH Aepb Commonly known as:  BREO ELLIPTA Inhale 1 puff into the lungs daily.   hydrochlorothiazide 12.5 MG tablet Commonly known as:  HYDRODIURIL Take 12.5 mg by mouth daily.   ipratropium-albuterol 0.5-2.5 (3) MG/3ML Soln Commonly known as:  DUONEB Take 3 mLs by nebulization every 6 (six) hours as needed.   montelukast 10 MG tablet Commonly known as:  SINGULAIR Take 1 tablet (10 mg total) by mouth at bedtime.   sertraline 50 MG tablet Commonly known as:  ZOLOFT Take 50 mg by mouth daily.   valsartan 320 MG tablet Commonly known as:  DIOVAN Take 320 mg by mouth daily.       Past Medical History:  Diagnosis Date  . Asthma   . Claustrophobia   . Depression   . Hypertension   . Kidney stone    YRS AGO   PASSED ON OWN     Past Surgical History:  Procedure Laterality Date  . ANKLE SURGERY  RIGHT  . LASIK    . LUMBAR LAMINECTOMY/DECOMPRESSION MICRODISCECTOMY Right 02/28/2013   Procedure: LUMBAR LAMINECTOMY/DECOMPRESSION MICRODISCECTOMY 1 LEVEL;  Surgeon: Maeola Harman, MD;  Location: [facility];  Service: Neurosurgery;  Laterality: Right;  Right Lumbar Four-Five Laminectomy  . MAXIMUM ACCESS (MAS)POSTERIOR LUMBAR INTERBODY FUSION (PLIF) 1 LEVEL N/A 04/23/2015   Procedure: L4-5 Maximum access posterior lumbar interbody fusion;  Surgeon: Maeola Harman, MD;  Location: [facility];  Service: Neurosurgery;  Laterality: N/A;  L4-5 Maximum access posterior lumbar interbody fusion  . RADIOLOGY WITH ANESTHESIA N/A 02/05/2015   Procedure: MRI - Lumbar Spine;  Surgeon: Medication Radiologist, MD;  Location: [facility] OR;   Service: Radiology;  Laterality: N/A;  . TONSILLECTOMY     AS CHILD      Review of systems negative except as noted in HPI / PMHx or noted below:  Review of Systems  Constitutional: Negative.   HENT: Negative.   Eyes: Negative.   Respiratory: Negative.   Cardiovascular: Negative.   Gastrointestinal: Negative.   Genitourinary: Negative.   Musculoskeletal: Negative.   Skin: Negative.   Neurological: Negative.   Endo/Heme/Allergies: Negative.   Psychiatric/Behavioral: Negative.      Objective:   Vitals:   10/04/18 1741  BP: 122/86  Pulse: 95  Resp: 16  SpO2: 97%   Height: 5\' 8"  (172.7 cm)  Weight: (!) 345 lb (156.5 kg)   Physical Exam Constitutional:      Appearance: He is not diaphoretic.     Comments: Coughing   HENT:     Head: Normocephalic.     Right Ear: Tympanic membrane, ear canal and external ear normal.     Left Ear: Tympanic membrane, ear canal and external ear normal.     Nose: Mucosal edema present. No rhinorrhea.     Mouth/Throat:     Pharynx: Uvula midline. No oropharyngeal exudate.  Eyes:     Conjunctiva/sclera: Conjunctivae normal.  Neck:     Thyroid: No thyromegaly.     Trachea: Trachea normal. No tracheal tenderness or tracheal deviation.  Cardiovascular:     Rate and Rhythm: Normal rate and regular rhythm.     Heart sounds: Normal heart sounds, S1 normal and S2 normal. No murmur.  Pulmonary:     Effort: No respiratory distress.     Breath sounds: No stridor. Wheezing (Expiratory wheezes at forced expiration posterior lung fields) present. No rales.  Lymphadenopathy:     Head:     Right side of head: No tonsillar adenopathy.     Left side of head: No tonsillar adenopathy.     Cervical: No cervical adenopathy.  Skin:    Findings: No erythema or rash.     Nails: There is no clubbing.   Neurological:     Mental Status: He is alert.     Diagnostics:    Spirometry was performed and demonstrated an FEV1 of 3.0 at 82 % of  predicted.  The patient had an Asthma Control Test with the following results: ACT Total Score: 9.    Results of blood tests obtained 25 October 2016 identified WBC 11.6, absolute eosinophil 1000, absolute basophil 200, absolute lymphocyte 2400, hemoglobin 15.1, platelet 321  Assessment and Plan:   1. Asthma, not well controlled, severe persistent, with acute exacerbation   2. Other allergic rhinitis   3. Eosinophilia     1.  Depo-Medrol 80 IM delivered in clinic today.  Prednisone 20 mg daily for 10 days  2.  Every day utilize the following  medications:   A.  Breo 200 -1 inhalation 1 time per day  B. Incruse -1 inhalation 1 time per day  C.  Flonase -1 spray each nostril 1 time per day  D.  Montelukast 10 mg -1 tablet 1 time per day  3.  Continue benralizumab injections for now.  Consider changing to mepolizumab injections  4.  If needed:   A.  Albuterol nebulization or MDI 2 inhalations every 4-6 hours  B.  OTC antihistamine  C.  Nasal saline  5.  Return to clinic in 2 weeks or earlier if problem  6. 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  For now he will utilize a systemic steroid once again and we will change him over to mepolizumab as he appeared to be doing better using benralizumab when utilize on an every month basis.  Unfortunately, this medication is only indicated for every other month use after the initial 3 months of use.  I will check a CBC with differential to see if his benralizumab every 8 weeks is resulting in good control of his eosinophilia.  If his eosinophil count has increased since his last blood check in the face of benralizumab then we need to consider some form of hyper eosinophilic syndrome.  I will see him back in his clinic in 2 weeks or earlier if there is a problem.  Laurette Schimke, MD Allergy / Immunology [facility]

## 2018-10-05 ENCOUNTER — Encounter: Payer: Self-pay | Admitting: Allergy and Immunology

## 2018-10-05 LAB — CBC WITH DIFFERENTIAL/PLATELET
Basophils Absolute: 0.1 10*3/uL (ref 0.0–0.2)
Basos: 0 %
EOS (ABSOLUTE): 0 10*3/uL (ref 0.0–0.4)
Eos: 0 %
Hematocrit: 44.2 % (ref 37.5–51.0)
Hemoglobin: 14.7 g/dL (ref 13.0–17.7)
Immature Grans (Abs): 0.1 10*3/uL (ref 0.0–0.1)
Immature Granulocytes: 1 %
Lymphocytes Absolute: 2.9 10*3/uL (ref 0.7–3.1)
Lymphs: 19 %
MCH: 27 pg (ref 26.6–33.0)
MCHC: 33.3 g/dL (ref 31.5–35.7)
MCV: 81 fL (ref 79–97)
Monocytes Absolute: 1.2 10*3/uL — ABNORMAL HIGH (ref 0.1–0.9)
Monocytes: 8 %
NEUTROS PCT: 72 %
Neutrophils Absolute: 11 10*3/uL — ABNORMAL HIGH (ref 1.4–7.0)
PLATELETS: 374 10*3/uL (ref 150–450)
RBC: 5.45 x10E6/uL (ref 4.14–5.80)
RDW: 13.8 % (ref 11.6–15.4)
WBC: 15.3 10*3/uL — ABNORMAL HIGH (ref 3.4–10.8)

## 2018-10-06 ENCOUNTER — Ambulatory Visit: Payer: 59

## 2018-10-14 ENCOUNTER — Encounter: Payer: Self-pay | Admitting: Allergy

## 2018-10-14 ENCOUNTER — Ambulatory Visit (INDEPENDENT_AMBULATORY_CARE_PROVIDER_SITE_OTHER): Payer: 59 | Admitting: Allergy

## 2018-10-14 VITALS — BP 122/76 | HR 93 | Resp 16 | Ht 68.0 in | Wt 358.0 lb

## 2018-10-14 DIAGNOSIS — J4550 Severe persistent asthma, uncomplicated: Secondary | ICD-10-CM

## 2018-10-14 DIAGNOSIS — J3089 Other allergic rhinitis: Secondary | ICD-10-CM

## 2018-10-14 MED ORDER — UMECLIDINIUM BROMIDE 62.5 MCG/INH IN AEPB: 1.0000 | INHALATION_SPRAY | Freq: Every day | RESPIRATORY_TRACT | 3 refills | Status: DC

## 2018-10-14 NOTE — Patient Instructions (Addendum)
1.  Every day utilize the following medications:   A.  Breo 200 -1 inhalation 1 time per day  B.  Start Incruse -1 inhalation 1 time per day  C.  Nasal steroid spray as directed by ENT  D.  Montelukast 10 mg -1 tablet 1 time per day  2.  Will make change now from [place] to [place].  Tammy will work on approval and will see if you qualify for the Nucala autoinjector.  Nucala in a monthly injection.      Your recent CBC did not show eosinophilia.    3.  If needed:   A.  Albuterol nebulization or MDI 2 inhalations every 4-6 hours  B.  OTC antihistamine  C.  Nasal saline  4.  Return to clinic in 3 months or earlier if problem

## 2018-10-14 NOTE — Progress Notes (Signed)
Follow-up Note  RE: Don [name] MRN: [id number] DOB: 19-Aug-1966 Date of Office Visit: 10/14/2018   History of present illness: Don [name] is a 53 y.o. male presenting today for follow-up.  He presented on January 7 for his routine Fasenra injection and was found to have increased asthma symptoms and thus saw Dr. Lucie Leather in office.  He does report that around Thanksgiving he had increased asthma symptoms with wheezing and coughing using his albuterol on daily basis.  He states he was seen by an urgent care at that time and did complete a course of prednisone which helped.  However his symptoms started to return again he was starting to need to use his albuterol more frequently.  At his visit on Jan 7th he was provided with a Depo-Medrol injection as well as a steroid pack which today is his last day.  He is feeling better from this but states he still has SOB which is a normal occurrence for him.  His albuterol use has been less this week of course due to oral steroid use.  It is noted from Dr. Kathyrn Lass notes to have him start Incruse however it appears this was not prescribed thus he has not started on this.  He does do Fasenra injections every 2 months and we have been debating whether to change him to a different agent.  He did have improvement in his symptoms in the early course of [place] Challenger but his effectiveness of his symptoms seems to be waning.  He denies any significant nasal congestion or drainage at this time.  He states he did see ENT after his last visit with me in May.  He states he was recommended to use a different nasal spray but he does not recall that name of this spray.  He has been using this new spray instead of the Flonase.    Review of systems: Review of Systems  Constitutional: Negative for chills, fever, malaise/fatigue and weight loss.  HENT: Negative for congestion, ear discharge, nosebleeds and sore throat.   Eyes: Negative for pain, discharge and redness.    Respiratory: Positive for cough, shortness of breath and wheezing.   Cardiovascular: Negative for chest pain.  Gastrointestinal: Negative for abdominal pain, constipation, diarrhea, heartburn, nausea and vomiting.  Musculoskeletal: Negative for joint pain.  Skin: Negative for itching and rash.  Neurological: Negative for headaches.    All other systems negative unless noted above in HPI  Past medical/social/surgical/family history have been reviewed and are unchanged unless specifically indicated below.  No changes  Medication List: Allergies as of 10/14/2018      Reactions   Lisinopril Other (See Comments)   Muscle aches and fatigue.       Medication List       Accurate as of October 14, 2018  1:40 PM. Always use your most recent med list.        albuterol 108 (90 Base) MCG/ACT inhaler Commonly known as:  PROVENTIL HFA;VENTOLIN HFA Inhale 2 puffs into the lungs every 6 (six) hours as needed for shortness of breath.   albuterol 108 (90 Base) MCG/ACT inhaler Commonly known as:  PROAIR HFA Inhale 2 puffs into the lungs every 4 (four) hours as needed for wheezing or shortness of breath.   amLODipine 10 MG tablet Commonly known as:  NORVASC Take 10 mg by mouth daily.   Benralizumab 30 MG/ML Sosy Commonly known as:  FASENRA Inject 30 mg into the skin every 8 (eight) weeks.   diclofenac  50 MG EC tablet Commonly known as:  VOLTAREN Take 50 mg by mouth 2 (two) times daily.   diclofenac 75 MG EC tablet Commonly known as:  VOLTAREN Take 1 tablet (75 mg total) by mouth 2 (two) times daily.   fluticasone furoate-vilanterol 200-25 MCG/INH Aepb Commonly known as:  BREO ELLIPTA Inhale 1 puff into the lungs daily.   furosemide 40 MG tablet Commonly known as:  LASIX   hydrochlorothiazide 12.5 MG tablet Commonly known as:  HYDRODIURIL Take 12.5 mg by mouth daily.   ipratropium 0.06 % nasal spray Commonly known as:  ATROVENT Place 2 sprays into both nostrils 3 (three)  times daily.   ipratropium-albuterol 0.5-2.5 (3) MG/3ML Soln Commonly known as:  DUONEB Take 3 mLs by nebulization every 6 (six) hours as needed.   montelukast 10 MG tablet Commonly known as:  SINGULAIR Take 1 tablet (10 mg total) by mouth at bedtime.   sertraline 50 MG tablet Commonly known as:  ZOLOFT Take 50 mg by mouth daily.   SYMBICORT 80-4.5 MCG/ACT inhaler Generic drug:  budesonide-formoterol TAKE 2 PUFFS BY MOUTH TWICE A DAY   umeclidinium bromide 62.5 MCG/INH Aepb Commonly known as:  INCRUSE ELLIPTA Inhale 1 puff into the lungs daily.   valsartan 320 MG tablet Commonly known as:  DIOVAN Take 320 mg by mouth daily.       Known medication allergies: Allergies  Allergen Reactions  . Lisinopril Other (See Comments)    Muscle aches and fatigue.      Physical examination: Blood pressure 122/76, pulse 93, resp. rate 16, height 5\' 8"  (1.727 m), weight (!) 358 lb (162.4 kg), SpO2 95 %.  General: Alert, interactive, in no acute distress, obese. HEENT: PERRLA, TMs pearly gray, turbinates minimally edematous without discharge, post-pharynx non erythematous. Neck: Supple without lymphadenopathy. Lungs: Clear to auscultation without wheezing, rhonchi or rales. {no increased work of breathing. CV: Normal S1, S2 without murmurs. Abdomen: Nondistended, nontender. Skin: Warm and dry, without lesions or rashes. Extremities:  No clubbing, cyanosis or edema. Neuro:   Grossly intact.  Diagnositics/Labs: Labs:  Component     Latest Ref Rng & Units 10/04/2018  WBC     3.4 - 10.8 x10E3/uL 15.3 (H)  RBC     4.14 - 5.80 x10E6/uL 5.45  Hemoglobin     13.0 - 17.7 g/dL 29.9  HCT     24.2 - 68.3 % 44.2  MCV     79 - 97 fL 81  MCH     26.6 - 33.0 pg 27.0  MCHC     31.5 - 35.7 g/dL 41.9  RDW     62.2 - 29.7 % 13.8  Platelets     150 - 450 x10E3/uL 374  Neutrophils     Not Estab. % 72  Lymphs     Not Estab. % 19  Monocytes     Not Estab. % 8  Eos     Not Estab. %  0  Basos     Not Estab. % 0  NEUT#     1.4 - 7.0 x10E3/uL 11.0 (H)  Lymphocyte #     0.7 - 3.1 x10E3/uL 2.9  Monocytes Absolute     0.1 - 0.9 x10E3/uL 1.2 (H)  EOS (ABSOLUTE)     0.0 - 0.4 x10E3/uL 0.0  Basophils Absolute     0.0 - 0.2 x10E3/uL 0.1  Immature Granulocytes     Not Estab. % 1  Immature Grans (Abs)     0.0 -  0.1 x10E3/uL 0.1    Spirometry: FEV1: 3.15L 86%, FVC: 3.96L 84%, ratio consistent with Nonobstructive pattern  Assessment and plan:   Severe persistent asthma -with now 2 exacerbations within the past 3 or so months I think this is now the time to change him over from [place] to [place] monthly injections.  We discussed the option of the autoinjector today and he did seem interested in this.  His CBC showed no eosinophils which most likely related to his Harrington ChallengerFasenra use.  However CBC showed a leukocytosis which likely is related to likely URI exacerbating his asthma.  He has not had any relatively recent steroid use except the course provided at Thanksgiving which should have been out of his system by the time of this lab.  He did feel that he was much better controlled when he was receiving the monthly injections initially on Fasenra; we will change him over to monthly Nucala and see how he responds.  I also agree that the addition of Incruse may be helpful for him.  Allergic rhinitis - continue use of nasal spray as directed by ENT.     1.  Every day utilize the following medications:   A.  Breo 200 -1 inhalation 1 time per day  B.  Start Incruse -1 inhalation 1 time per day  C.  Nasal steroid spray as directed by ENT  D.  Montelukast 10 mg -1 tablet 1 time per day  2.  Will make change now from [place] to [place].  Tammy will work on approval and will see if you qualify for the Nucala autoinjector.  Nucala in a monthly injection.    3.  If needed:   A.  Albuterol nebulization or MDI 2 inhalations every 4-6 hours  B.  OTC antihistamine  C.  Nasal saline  4.   Return to clinic in 3 months or earlier if problem    I appreciate the opportunity to take part in Spike's care. Please do not hesitate to contact me with questions.  Sincerely,   Margo AyeShaylar Padgett, MD Allergy/Immunology [facility]

## 2018-10-18 ENCOUNTER — Other Ambulatory Visit: Payer: Self-pay | Admitting: Allergy

## 2018-10-18 DIAGNOSIS — J4550 Severe persistent asthma, uncomplicated: Secondary | ICD-10-CM

## 2018-10-20 ENCOUNTER — Encounter: Payer: Self-pay | Admitting: Podiatry

## 2018-10-20 ENCOUNTER — Ambulatory Visit (INDEPENDENT_AMBULATORY_CARE_PROVIDER_SITE_OTHER): Payer: 59

## 2018-10-20 ENCOUNTER — Other Ambulatory Visit: Payer: Self-pay | Admitting: Podiatry

## 2018-10-20 ENCOUNTER — Ambulatory Visit: Payer: 59 | Admitting: Podiatry

## 2018-10-20 DIAGNOSIS — M205X1 Other deformities of toe(s) (acquired), right foot: Secondary | ICD-10-CM | POA: Diagnosis not present

## 2018-10-20 DIAGNOSIS — M79672 Pain in left foot: Secondary | ICD-10-CM

## 2018-10-20 DIAGNOSIS — M79671 Pain in right foot: Secondary | ICD-10-CM

## 2018-10-20 DIAGNOSIS — M205X2 Other deformities of toe(s) (acquired), left foot: Secondary | ICD-10-CM

## 2018-10-20 DIAGNOSIS — J45909 Unspecified asthma, uncomplicated: Secondary | ICD-10-CM | POA: Insufficient documentation

## 2018-10-20 NOTE — Patient Instructions (Signed)
Pre-Operative Instructions  Congratulations, you have decided to take an important step towards improving your quality of life.  You can be assured that the doctors and staff at [facility] will be with you every step of the way.  Here are some important things you should know:  1. Plan to be at the surgery center/hospital at least 1 (one) hour prior to your scheduled time, unless otherwise directed by the surgical center/hospital staff.  You must have a responsible adult accompany you, remain during the surgery and drive you home.  Make sure you have directions to the surgical center/hospital to ensure you arrive on time. 2. If you are having surgery at [facility] or [facility], you will need a copy of your medical history and physical form from your family physician within one month prior to the date of surgery. We will give you a form for your primary physician to complete.  3. We make every effort to accommodate the date you request for surgery.  However, there are times where surgery dates or times have to be moved.  We will contact you as soon as possible if a change in schedule is required.   4. No aspirin/ibuprofen for one week before surgery.  If you are on aspirin, any non-steroidal anti-inflammatory medications (Mobic, Aleve, Ibuprofen) should not be taken seven (7) days prior to your surgery.  You make take Tylenol for pain prior to surgery.  5. Medications - If you are taking daily heart and blood pressure medications, seizure, reflux, allergy, asthma, anxiety, pain or diabetes medications, make sure you notify the surgery center/hospital before the day of surgery so they can tell you which medications you should take or avoid the day of surgery. 6. No food or drink after midnight the night before surgery unless directed otherwise by surgical center/hospital staff. 7. No alcoholic beverages 24-hours prior to surgery.  No smoking 24-hours prior or 24-hours after  surgery. 8. Wear loose pants or shorts. They should be loose enough to fit over bandages, boots, and casts. 9. Don't wear slip-on shoes. Sneakers are preferred. 10. Bring your boot with you to the surgery center/hospital.  Also bring crutches or a walker if your physician has prescribed it for you.  If you do not have this equipment, it will be provided for you after surgery. 11. If you have not been contacted by the surgery center/hospital by the day before your surgery, call to confirm the date and time of your surgery. 12. Leave-time from work may vary depending on the type of surgery you have.  Appropriate arrangements should be made prior to surgery with your employer. 13. Prescriptions will be provided immediately following surgery by your doctor.  Fill these as soon as possible after surgery and take the medication as directed. Pain medications will not be refilled on weekends and must be approved by the doctor. 14. Remove nail polish on the operative foot and avoid getting pedicures prior to surgery. 15. Wash the night before surgery.  The night before surgery wash the foot and leg well with water and the antibacterial soap provided. Be sure to pay special attention to beneath the toenails and in between the toes.  Wash for at least three (3) minutes. Rinse thoroughly with water and dry well with a towel.  Perform this wash unless told not to do so by your physician.  Enclosed: 1 Ice pack (please put in freezer the night before surgery)   1 Hibiclens skin cleaner     Pre-op instructions  If you have any questions regarding the instructions, please do not hesitate to call our office.  [place]: [address], [place], [place] [address] -- [phone]  [place]: [address] [place], [place] [address] -- [phone]  [place]: [address]  [place], [place] [address] -- [phone]  [place]: [address], [address], [place], [place] [address] -- [phone]  Website:  [email or web address] 

## 2018-10-21 NOTE — Progress Notes (Signed)
Subjective:   Patient ID: Don [name], male   DOB: 53 y.o.   MRN: [id number]   HPI Patient states my big toe joint has continued to worsen on my right foot and the medication only helped me temporarily and its been becoming increasingly sore.  Also states that he is tried shoe gear modifications she is tried wider shoes and he is tried reduced activity but at this point he cannot bear weight on that part of his foot and he is interested in correction   ROS      Objective:  Physical Exam  Neurovascular status intact with patient found to have significant obesity but does have overall good health with no current other pathology.  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  At this point I have recommended surgical intervention with removal of bone spurs and realigning the joint surface and did explain that ultimately it could require fusion implantation procedure.  Patient wants surgery and at this point I allowed him to go over consent form reviewing alternative treatments and complications.  Patient understands again all complications and the fact that ultimately it may require a fusion or implantation and at this point patient is scheduled for outpatient surgery and is given all instructions on correction and the fact that total recovery will take approximately 6 months.  Patient is dispensed air fracture walker with instructions on usage and is encouraged to call with questions and is getting get it done as soon as possible due to the tenseness of discomfort  X-rays indicate significant spur formation  right first MPJ with narrowing of the joint surface and moderate flattening of the joint surface with significant spurs

## 2018-10-24 ENCOUNTER — Telehealth: Payer: Self-pay | Admitting: *Deleted

## 2018-10-24 NOTE — Telephone Encounter (Signed)
"  I'm calling regarding my husband, Don [name].  He's due to have surgery tomorrow with Dr. Charlsie Merles.  We were waiting to hear back if you guys had gotten insurance approval.  Please give me a call back."  I am returning your call.  His surgery was approved by [facility].  "Great, what time?"  Someone from the surgical center should give him a call.  "Okay, thank you."

## 2018-10-25 ENCOUNTER — Encounter: Payer: Self-pay | Admitting: Podiatry

## 2018-10-25 DIAGNOSIS — M79676 Pain in unspecified toe(s): Secondary | ICD-10-CM

## 2018-10-25 DIAGNOSIS — M21611 Bunion of right foot: Secondary | ICD-10-CM | POA: Diagnosis not present

## 2018-10-25 DIAGNOSIS — M2021 Hallux rigidus, right foot: Secondary | ICD-10-CM | POA: Diagnosis not present

## 2018-11-03 ENCOUNTER — Ambulatory Visit (INDEPENDENT_AMBULATORY_CARE_PROVIDER_SITE_OTHER): Payer: 59

## 2018-11-03 VITALS — Temp 98.1°F

## 2018-11-03 DIAGNOSIS — M205X1 Other deformities of toe(s) (acquired), right foot: Secondary | ICD-10-CM | POA: Diagnosis not present

## 2018-11-03 DIAGNOSIS — Z09 Encounter for follow-up examination after completed treatment for conditions other than malignant neoplasm: Secondary | ICD-10-CM

## 2018-11-04 ENCOUNTER — Other Ambulatory Visit: Payer: 59

## 2018-11-07 DIAGNOSIS — J4550 Severe persistent asthma, uncomplicated: Secondary | ICD-10-CM | POA: Diagnosis not present

## 2018-11-07 NOTE — Progress Notes (Signed)
Patient is here today for surgery follow-up exam.  Date of surgery 10/25/2018, [place] bunionectomy right foot.  He states that his foot feels okay, throbs sometimes when he has it down, but he is only taking the pain meds as needed and has not had any pain meds for the past couple days.  Patient denies fever, chills, nausea, vomiting.  Vital signs stable.  He does state that there is some soreness in his big toe joint at times.  Noted well-healing surgical incision, some swelling noted but within normal limits for the surgery.  He has good range of motion of his big toe joint.  There is no erythema, no redness.  Wound edges are aligned and coapted, no gapping noted.  X-rays were evaluated and discussed in the room by Dr. Al Corpus.  Advised the patient that he can get his foot wet, but he is to remain in his boot at all times.  Did dispense a surgical shoe, that he can begin to use at home.  He is to follow-up at his regularly scheduled visit in 3 weeks or sooner with any acute symptom changes.

## 2018-11-08 ENCOUNTER — Encounter: Payer: Self-pay | Admitting: *Deleted

## 2018-11-15 ENCOUNTER — Telehealth: Payer: Self-pay | Admitting: *Deleted

## 2018-11-15 NOTE — Telephone Encounter (Signed)
I followed up with patient regarding his change in therapy from Fasenra to [place] autoinjector per Dr Delorse Lek.  I had instructed patient when he received same on 2/11 to reach out to [place] clinic to come in and provide instruction.  I followed up with patient to find out if he had started therapy and he advised had done so but we did not have any documentation to when he started. I instructed patient to remember to reorder same and he advised he had to sign for package and since he is getting ready to go back to work he wont be home.  I did advise patient to ask for no signature and if they will no deliver with same he can have it delivered to [facility] office to pick up later

## 2018-11-15 NOTE — Progress Notes (Signed)
Immunotherapy   Patient Details  Name: Don [name] MRN: [id number] Date of Birth: December 09, 1965  11/08/2018  [name] started injections for  Nucala 100 mg every 28 days. Patient received the auto injector and will continue to give himself injections at home. Patient was instructed on how to give himself the shot. Patient had no problems after 30 minutes in the office. Epi-Pen:Epi-Pen Available  Consent signed and patient instructions given.   Mariane Duval 11/08/2018, 1:52 PM

## 2018-11-15 NOTE — Telephone Encounter (Signed)
Patient received first Nucala here in the office on 11/08/2018. No problems after 30 minutes in the office.

## 2018-11-16 ENCOUNTER — Encounter: Payer: Self-pay | Admitting: Podiatry

## 2018-11-16 ENCOUNTER — Ambulatory Visit (INDEPENDENT_AMBULATORY_CARE_PROVIDER_SITE_OTHER): Payer: 59

## 2018-11-16 ENCOUNTER — Ambulatory Visit (INDEPENDENT_AMBULATORY_CARE_PROVIDER_SITE_OTHER): Payer: 59 | Admitting: Podiatry

## 2018-11-16 DIAGNOSIS — Z09 Encounter for follow-up examination after completed treatment for conditions other than malignant neoplasm: Secondary | ICD-10-CM

## 2018-11-16 DIAGNOSIS — M205X1 Other deformities of toe(s) (acquired), right foot: Secondary | ICD-10-CM

## 2018-11-21 ENCOUNTER — Other Ambulatory Visit: Payer: Self-pay

## 2018-11-21 DIAGNOSIS — M79671 Pain in right foot: Secondary | ICD-10-CM

## 2018-11-21 NOTE — Progress Notes (Signed)
Subjective:   Patient ID: Don [name], male   DOB: 53 y.o.   MRN: [id number]   HPI Patient presents stating he is doing pretty well with surgery and is having minimal discomfort and walking okay but has not worked well in his big toe joint motion and obesity is complicating factor   ROS      Objective:  Physical Exam  Neurovascular status intact negative Homans sign noted with wound edges well coapted first MPJ right with good range of motion and no crepitus of the joint when pressed.  There is reduced dorsiflexion is is not able to work this as I would like     Assessment:  Overall doing well but does have reduced range of motion over what I would like to see first MPJ right     Plan:  X-ray reviewed and advised on the importance of continued range of motion exercises and I did send to physical therapy for several weeks of aggressive physical therapy with education.  Patient will be seen back 4 weeks continue immobilization and elevation  X-rays indicate that there is good healing of the osteotomy noted with fixation in place and joint congruence

## 2018-11-21 NOTE — Progress Notes (Signed)
refe

## 2018-12-13 DIAGNOSIS — J4550 Severe persistent asthma, uncomplicated: Secondary | ICD-10-CM | POA: Diagnosis not present

## 2018-12-14 ENCOUNTER — Encounter: Payer: Self-pay | Admitting: Podiatry

## 2018-12-14 ENCOUNTER — Other Ambulatory Visit: Payer: Self-pay

## 2018-12-14 ENCOUNTER — Ambulatory Visit (INDEPENDENT_AMBULATORY_CARE_PROVIDER_SITE_OTHER): Payer: 59 | Admitting: Podiatry

## 2018-12-14 ENCOUNTER — Ambulatory Visit (INDEPENDENT_AMBULATORY_CARE_PROVIDER_SITE_OTHER): Payer: 59

## 2018-12-14 DIAGNOSIS — M205X1 Other deformities of toe(s) (acquired), right foot: Secondary | ICD-10-CM

## 2018-12-16 DIAGNOSIS — J45909 Unspecified asthma, uncomplicated: Secondary | ICD-10-CM | POA: Diagnosis not present

## 2018-12-16 DIAGNOSIS — Z0000 Encounter for general adult medical examination without abnormal findings: Secondary | ICD-10-CM | POA: Diagnosis not present

## 2018-12-16 DIAGNOSIS — I10 Essential (primary) hypertension: Secondary | ICD-10-CM | POA: Diagnosis not present

## 2018-12-16 DIAGNOSIS — R7303 Prediabetes: Secondary | ICD-10-CM | POA: Diagnosis not present

## 2018-12-18 NOTE — Progress Notes (Signed)
Subjective:   Patient ID: Don [name], male   DOB: 53 y.o.   MRN: [id number]   HPI Patient states overall his foot feels good and he knows he needs to get back to work.  Patient is obese which is a complicating factor and he does have sleep apnea which I am trying to get him to focus on   ROS      Objective:  Physical Exam  Neurovascular status intact negative Homans sign noted with patient's first metatarsal right healed well and motion is mildly restricted but is no longer painful and he is able to walk in a normal heel toe light gait     Assessment:  Overall doing well with obesity is complicating factor in good healing of the first metatarsal right with no crepitus of the joint     Plan:  Reviewed that I am satisfied currently with how he is doing and I have recommended that he return to work over the next couple weeks.  Patient wants to do this and at this point will gradually increase his activity  X-ray indicates the osteotomy is healing well with moderate narrowing of the joint surface but it is functioning very well clinically

## 2018-12-27 ENCOUNTER — Other Ambulatory Visit: Payer: Self-pay | Admitting: *Deleted

## 2018-12-27 DIAGNOSIS — J4550 Severe persistent asthma, uncomplicated: Secondary | ICD-10-CM

## 2018-12-27 MED ORDER — IPRATROPIUM-ALBUTEROL 0.5-2.5 (3) MG/3ML IN SOLN: 3.0000 mL | Freq: Four times a day (QID) | RESPIRATORY_TRACT | 0 refills | Status: DC | PRN

## 2019-01-03 ENCOUNTER — Telehealth: Payer: Self-pay | Admitting: Allergy

## 2019-01-03 DIAGNOSIS — J019 Acute sinusitis, unspecified: Secondary | ICD-10-CM | POA: Diagnosis not present

## 2019-01-03 DIAGNOSIS — J209 Acute bronchitis, unspecified: Secondary | ICD-10-CM | POA: Diagnosis not present

## 2019-01-03 NOTE — Telephone Encounter (Signed)
Patient is not feeling well Has an appt at the end of the month - but wants something sooner Offered tomorrow - but patient is complaining of congestion and feeling bad Please call

## 2019-01-03 NOTE — Telephone Encounter (Signed)
I called patient back and he informed me that he is going to go to an urgent care for treatment.

## 2019-01-13 ENCOUNTER — Ambulatory Visit: Payer: 59 | Admitting: Allergy

## 2019-01-18 ENCOUNTER — Other Ambulatory Visit: Payer: Self-pay

## 2019-01-18 ENCOUNTER — Encounter: Payer: Self-pay | Admitting: Allergy

## 2019-01-18 ENCOUNTER — Ambulatory Visit (INDEPENDENT_AMBULATORY_CARE_PROVIDER_SITE_OTHER): Payer: 59 | Admitting: Allergy

## 2019-01-18 ENCOUNTER — Ambulatory Visit
Admission: RE | Admit: 2019-01-18 | Discharge: 2019-01-18 | Disposition: A | Discharge status: Home / Self Care | Payer: 59 | Source: Ambulatory Visit | Attending: Allergy | Admitting: Allergy

## 2019-01-18 ENCOUNTER — Other Ambulatory Visit: Payer: Self-pay | Admitting: Allergy

## 2019-01-18 DIAGNOSIS — J3089 Other allergic rhinitis: Secondary | ICD-10-CM

## 2019-01-18 DIAGNOSIS — R5383 Other fatigue: Secondary | ICD-10-CM | POA: Diagnosis not present

## 2019-01-18 DIAGNOSIS — J45909 Unspecified asthma, uncomplicated: Secondary | ICD-10-CM

## 2019-01-18 DIAGNOSIS — J4550 Severe persistent asthma, uncomplicated: Secondary | ICD-10-CM

## 2019-01-18 DIAGNOSIS — R0602 Shortness of breath: Secondary | ICD-10-CM | POA: Diagnosis not present

## 2019-01-18 NOTE — Progress Notes (Signed)
RE: Don CornerMichael [name] MRN: [id number] DOB: 1966-09-10 Date of Telemedicine Visit: 01/18/2019  Referring provider: Darrow BussingKoirala, Dibas, MD Primary care provider: Darrow BussingKoirala, Dibas, MD  Chief Complaint: SOB   Telemedicine Follow Up Visit via Telephone: I connected with Don [name] for a follow up on 01/18/19 by telephone and verified that I am speaking with the correct person using two identifiers.   I discussed the limitations, risks, security and privacy concerns of performing an evaluation and management service by telephone and the availability of in person appointments. I also discussed with the patient that there may be a patient responsible charge related to this service. The patient expressed understanding and agreed to proceed.  Patient is at work.  Provider is at the office.   Visit start time: 1014 Visit end time: [id number]  Insurance consent/check in by: Marlene BastMarie C Medical consent and medical assistant/nurse: Darreld Mcleanrina S  History of Present Illness: He is a 53 y.o. male, who is being followed for severe persistent asthma and allergic rhinitis. His previous allergy office visit was on 10/14/2018 with Dr. Delorse LekPadgett.   He states he was sick several weeks ago with increased in cough, SOB and went to UC where he was treated with prednisone course x 8 days, levofloxacin and a cough medication.  He states his symtoms improved but he still has his underlying SOB.  However now he states he is becoming difficult to even walk across parking lot of his work without getting winded.  After his last visit we felt the [place] Ch[place]asenra was no longer providing him control and thus we changed to [place] which he started in February.  He states around the same time of starting Nucala he started developing joint pain of ankles and shoulders, fatigue and a facial rash.  He states he looked up side effects and saw it could be related to nucala and thus he stopped this.  He also states the incruse did not seem to be working and it also had  similar side effects thus he stopped this too. He does still take Breo 1 puff daily and the singulair.  Still needed albuterol multiple times a day.  He states he saw a commercial on Dupixent and is interested in changing to this option.   He also feels like he is not sleeping well and is very fatigued during the day.     Assessment and Plan: Don [name] is a 53 y.o. male with: Severe persistent asthma - he had developed possible side effects related to Nucala and/or Incruse and has stopped both of these agents.  He also does not feel that Nucala or Incruse has provided him any better control than Fasenra.  He is interested in Dupixent which is the last injectable biologic to try to determine if he will have any benefit.  I will refer to pulmonary at this point to determine other treatment options as well as sleep apnea evaluation.  He would like to try Dupixent option while awaiting pulmonary appt.  He will continue on Breo and singulair.  Will obtain CXR with recent exacerbation.   Allergic rhinitis - continue use of nasal spray as directed by ENT.   Fatigue - I believe he has sleep apnea given weight and daytime drowsiness.  Will refer to pulmonary.    1.  Every day utilize the following medications:   A.  Breo 200 -1 inhalation 1 time per day  B.  Nasal steroid spray as directed by ENT  C.  Montelukast 10 mg -1 tablet  1 time per day  2.  Will change to Dupixent as last self-injectable asthma agent  3.  If needed:   A.  Albuterol nebulization or MDI 2 inhalations every 4-6 hours  B.  OTC antihistamine  C.  Nasal saline  4. Obtain CXR  5.  Will refer to pulmonology for further evaluation and treatment of asthma and for sleep apnea evaluation  6.  Return to clinic in 3-4 months or earlier if problem  Diagnostics: None.  Medication List:  Current Outpatient Medications  Medication Sig Dispense Refill   albuterol (PROVENTIL HFA;VENTOLIN HFA) 108 (90 Base) MCG/ACT inhaler Inhale 2  puffs into the lungs every 6 (six) hours as needed for shortness of breath. 1 Inhaler 2   amLODipine (NORVASC) 10 MG tablet Take 10 mg by mouth daily.     BREO ELLIPTA 200-25 MCG/INH AEPB TAKE 1 PUFF BY MOUTH EVERY DAY 60 each 5   diclofenac (VOLTAREN) 50 MG EC tablet Take 50 mg by mouth 2 (two) times daily.     furosemide (LASIX) 40 MG tablet      hydrochlorothiazide (HYDRODIURIL) 12.5 MG tablet Take 12.5 mg by mouth daily.     ipratropium (ATROVENT) 0.06 % nasal spray Place 2 sprays into both nostrils 3 (three) times daily. 15 mL 5   ipratropium-albuterol (DUONEB) 0.5-2.5 (3) MG/3ML SOLN Take 3 mLs by nebulization every 6 (six) hours as needed. 150 mL 0   Mepolizumab (NUCALA [place]) Inject into the skin. Every 4 weeks     montelukast (SINGULAIR) 10 MG tablet Take 1 tablet (10 mg total) by mouth at bedtime. 30 tablet 5   sertraline (ZOLOFT) 50 MG tablet Take 50 mg by mouth daily.     SYMBICORT 80-4.5 MCG/ACT inhaler TAKE 2 PUFFS BY MOUTH TWICE A DAY 10.2 Inhaler 0   umeclidinium bromide (INCRUSE ELLIPTA) 62.5 MCG/INH AEPB Inhale 1 puff into the lungs daily. 1 each 3   valsartan (DIOVAN) 320 MG tablet Take 320 mg by mouth daily.      Current Facility-Administered Medications  Medication Dose Route Frequency Provider Last Rate Last Dose   Benralizumab SOSY 30 mg  30 mg Subcutaneous Q8 Weeks Marcelyn Bruins, MD   30 mg at 10/04/18 1733   Allergies: Allergies  Allergen Reactions   Lisinopril Other (See Comments)    Muscle aches and fatigue.    I reviewed his past medical history, social history, family history, and environmental history and no significant changes have been reported from previous visit on 10/14/18.  Review of Systems  Constitutional: Positive for fatigue. Negative for chills and fever.  HENT: Negative for congestion, nosebleeds, postnasal drip, rhinorrhea and sneezing.   Eyes: Negative for pain, discharge and itching.  Respiratory: Positive for cough,  shortness of breath and wheezing.   Cardiovascular: Negative for chest pain.  Gastrointestinal: Negative for abdominal pain, constipation, diarrhea, nausea and vomiting.  Musculoskeletal: Positive for arthralgias.  Skin: Positive for rash.  Neurological: Negative for facial asymmetry.   Objective: Physical Exam Not obtained as encounter was done via telephone.   Previous notes and tests were reviewed.  I discussed the assessment and treatment plan with the patient. The patient was provided an opportunity to ask questions and all were answered. The patient agreed with the plan and demonstrated an understanding of the instructions.   The patient was advised to call back or seek an in-person evaluation if the symptoms worsen or if the condition fails to improve as anticipated.  I provided 36  minutes of non-face-to-face time during this encounter.  It was my pleasure to participate in Marques Ericson care today. Please feel free to contact me with any questions or concerns.   Sincerely,  Evia Goldsmith Larose Hires, MD

## 2019-01-18 NOTE — Patient Instructions (Addendum)
1.  Every day utilize the following medications:   A.  Breo 200 -1 inhalation 1 time per day  B.  Nasal steroid spray as directed by ENT  C.  Montelukast 10 mg -1 tablet 1 time per day  2.  Will change to Dupixent as last injectable asthma agent  3.  If needed:   A.  Albuterol nebulization or MDI 2 inhalations every 4-6 hours  B.  OTC antihistamine  C.  Nasal saline  4. Obtain CXR  5.  Will refer to pulmonology for further evaluation and treatment of asthma and for sleep apnea evaluation  6.  Return to clinic in 3-4 months or earlier if problem

## 2019-01-18 NOTE — Progress Notes (Signed)
Start time:  74 Finish Time:  1051 Where are you located:  work Do you give [place] permission to bill your insurance:  yes Are you signed up for my chart:  No, sent link  Pt states that he does not feel like his Nucala injection. Having pain in ankles, joint pain and a rash on face both cheeks and fatigue.  Looked up the side effects and stopped the medication.  He is still c/o SOB.  Also, stopped the Incruse because of the same side effects.  Felt like its not working.  Was on Advair did not work.   Last used albuterol when he woke up with no spacer. Went to the urgent care and was given prednisone and antibiotics and cough syrup with codeine.  Call to patient to tell him that he can go to the [facility] [address] no appointment needed for his CXR.

## 2019-01-19 ENCOUNTER — Other Ambulatory Visit: Payer: Self-pay | Admitting: Allergy

## 2019-01-19 DIAGNOSIS — J4550 Severe persistent asthma, uncomplicated: Secondary | ICD-10-CM

## 2019-01-20 ENCOUNTER — Telehealth: Payer: Self-pay

## 2019-01-20 NOTE — Telephone Encounter (Signed)
-----   Message from Florence Canner, [place] sent at 01/18/2019  3:48 PM EDT ----- Regarding: Pulmonary Referral Pulmonary Referral for  Server Persistent Asthma and Sleep Apnea Evaluation.  Per Dr. Delorse Lek Thanks [place] :)

## 2019-01-20 NOTE — Telephone Encounter (Signed)
Referral has been placed. Will follow back up in a few days.

## 2019-01-23 ENCOUNTER — Telehealth: Payer: Self-pay

## 2019-01-23 NOTE — Telephone Encounter (Signed)
Called patient wife and advised her that I have already submitted appeal. They had denied due to his EOS count was not within the six weeks prior to starting therapy but his count goes back to prior to starting Fasenra then Nucala and could not be checked accurately due to the mechanism of the biologics he has been.  I will reach out to her once I get approval for DUpixent.

## 2019-01-23 NOTE — Telephone Encounter (Signed)
Spoke with patients wife.  She states that she received a letter from [employer] stating that Dupixent was denied.  She is requesting a call back to discuss next steps.    Thank you for all of your help!

## 2019-01-25 DIAGNOSIS — Z8719 Personal history of other diseases of the digestive system: Secondary | ICD-10-CM | POA: Diagnosis not present

## 2019-01-25 DIAGNOSIS — Z1211 Encounter for screening for malignant neoplasm of colon: Secondary | ICD-10-CM | POA: Diagnosis not present

## 2019-02-04 ENCOUNTER — Other Ambulatory Visit: Payer: Self-pay | Admitting: Allergy

## 2019-02-10 ENCOUNTER — Ambulatory Visit (INDEPENDENT_AMBULATORY_CARE_PROVIDER_SITE_OTHER): Payer: 59 | Admitting: *Deleted

## 2019-02-10 ENCOUNTER — Other Ambulatory Visit: Payer: Self-pay

## 2019-02-10 ENCOUNTER — Ambulatory Visit: Payer: Self-pay

## 2019-02-10 DIAGNOSIS — J4550 Severe persistent asthma, uncomplicated: Secondary | ICD-10-CM | POA: Diagnosis not present

## 2019-02-10 MED ORDER — EPINEPHRINE 0.3 MG/0.3ML IJ SOAJ: 0.3000 mg | Freq: Once | INTRAMUSCULAR | 2 refills | Status: AC

## 2019-02-10 NOTE — Progress Notes (Signed)
Immunotherapy   Patient Details  Name: Camran Tietjen MRN: [id number] Date of Birth: 10/13/1965  02/10/2019  [name] started injections for  DUPIXENT  Frequency: 300mg  Every 2 Weeks  Epi-Pen: Yes Consent signed and patient instructions given. Fidencio received 600mg  loading dose of Dupixent today. He waited 30 minutes and did not experience any issues.    Ashleigh Fernandez-Vernon 02/10/2019, 3:43 PM

## 2019-02-13 ENCOUNTER — Other Ambulatory Visit: Payer: Self-pay | Admitting: Allergy

## 2019-02-13 ENCOUNTER — Other Ambulatory Visit: Payer: Self-pay | Admitting: Allergy & Immunology

## 2019-02-13 DIAGNOSIS — J4550 Severe persistent asthma, uncomplicated: Secondary | ICD-10-CM

## 2019-02-24 ENCOUNTER — Other Ambulatory Visit: Payer: Self-pay

## 2019-02-24 ENCOUNTER — Ambulatory Visit: Payer: Self-pay | Admitting: *Deleted

## 2019-02-24 DIAGNOSIS — J4550 Severe persistent asthma, uncomplicated: Secondary | ICD-10-CM

## 2019-02-24 MED ORDER — EPINEPHRINE 0.3 MG/0.3ML IJ SOAJ: 0.3000 mg | Freq: Once | INTRAMUSCULAR | 2 refills | Status: AC

## 2019-03-10 ENCOUNTER — Ambulatory Visit (INDEPENDENT_AMBULATORY_CARE_PROVIDER_SITE_OTHER): Payer: 59 | Admitting: *Deleted

## 2019-03-10 DIAGNOSIS — J4550 Severe persistent asthma, uncomplicated: Secondary | ICD-10-CM

## 2019-03-23 ENCOUNTER — Other Ambulatory Visit: Payer: Self-pay

## 2019-03-23 ENCOUNTER — Ambulatory Visit (INDEPENDENT_AMBULATORY_CARE_PROVIDER_SITE_OTHER): Payer: 59 | Admitting: *Deleted

## 2019-03-23 DIAGNOSIS — J4550 Severe persistent asthma, uncomplicated: Secondary | ICD-10-CM | POA: Diagnosis not present

## 2019-04-03 ENCOUNTER — Other Ambulatory Visit: Payer: Self-pay

## 2019-04-03 ENCOUNTER — Encounter (HOSPITAL_COMMUNITY): Payer: Self-pay | Admitting: Student

## 2019-04-03 ENCOUNTER — Emergency Department (HOSPITAL_COMMUNITY): Payer: No Typology Code available for payment source

## 2019-04-03 ENCOUNTER — Emergency Department (HOSPITAL_COMMUNITY)
Admission: EM | Admit: 2019-04-03 | Discharge: 2019-04-03 | Disposition: A | Discharge status: Home / Self Care | Payer: No Typology Code available for payment source | Attending: Emergency Medicine | Admitting: Emergency Medicine

## 2019-04-03 DIAGNOSIS — F17210 Nicotine dependence, cigarettes, uncomplicated: Secondary | ICD-10-CM | POA: Insufficient documentation

## 2019-04-03 DIAGNOSIS — Y93H2 Activity, gardening and landscaping: Secondary | ICD-10-CM | POA: Diagnosis not present

## 2019-04-03 DIAGNOSIS — W2209XA Striking against other stationary object, initial encounter: Secondary | ICD-10-CM | POA: Insufficient documentation

## 2019-04-03 DIAGNOSIS — S81812A Laceration without foreign body, left lower leg, initial encounter: Secondary | ICD-10-CM | POA: Insufficient documentation

## 2019-04-03 DIAGNOSIS — I10 Essential (primary) hypertension: Secondary | ICD-10-CM | POA: Diagnosis not present

## 2019-04-03 DIAGNOSIS — Y990 Civilian activity done for income or pay: Secondary | ICD-10-CM | POA: Insufficient documentation

## 2019-04-03 DIAGNOSIS — Z23 Encounter for immunization: Secondary | ICD-10-CM | POA: Diagnosis not present

## 2019-04-03 DIAGNOSIS — Y929 Unspecified place or not applicable: Secondary | ICD-10-CM | POA: Insufficient documentation

## 2019-04-03 DIAGNOSIS — Z79899 Other long term (current) drug therapy: Secondary | ICD-10-CM | POA: Insufficient documentation

## 2019-04-03 MED ORDER — BACITRACIN ZINC 500 UNIT/GM EX OINT: TOPICAL_OINTMENT | Freq: Once | CUTANEOUS | Status: DC

## 2019-04-03 MED ORDER — OXYCODONE-ACETAMINOPHEN 5-325 MG PO TABS
1.0000 | ORAL_TABLET | Freq: Once | ORAL | Status: AC
  Administered 2019-04-03: 1 via ORAL
  Filled 2019-04-03: qty 1

## 2019-04-03 MED ORDER — LIDOCAINE HCL (PF) 1 % IJ SOLN
15.0000 mL | Freq: Once | INTRAMUSCULAR | Status: AC
  Administered 2019-04-03: 15 mL
  Filled 2019-04-03: qty 15

## 2019-04-03 MED ORDER — TETANUS-DIPHTH-ACELL PERTUSSIS 5-2.5-18.5 LF-MCG/0.5 IM SUSP
0.5000 mL | Freq: Once | INTRAMUSCULAR | Status: AC
  Administered 2019-04-03: 0.5 mL via INTRAMUSCULAR
  Filled 2019-04-03: qty 0.5

## 2019-04-03 NOTE — ED Triage Notes (Signed)
Pt here for L leg injury-pt was mowing with zero turn mower and his shin got caught between the mower and a wall that the mower went under. PMS intact. L to L shin pain to R leg and foot.

## 2019-04-03 NOTE — ED Provider Notes (Signed)
[facility] Provider Note   CSN: [id number] Arrival date & time: 04/03/19  [id number]     History   Chief Complaint Chief Complaint  Patient presents with  . Leg Injury    HPI Don [name] is a 53 y.o. male with a hx of HTN, obesity, & depression who presents to the ED with complaints of left lower leg injury which occurred @ 7:30 this AM. Patient states he was mowing down a hill, the lawnmower went under a lower setting concrete balcony quickly @ the bottom causing him to run into the concrete structure. He states he struck his R foot & left lower leg sustaining laceration to the left lower leg. Pain is a 6/10 in severity without alleviating/aggravating factors. No intervention PTA. Unknown last tetanus. No associated fall/head injury. Denies numbness, tingling, or weakness.      HPI  Past Medical History:  Diagnosis Date  . Asthma   . Claustrophobia   . Depression   . Hypertension   . Kidney stone    YRS AGO   PASSED ON OWN     Patient Active Problem List   Diagnosis Date Noted  . Asthma 10/20/2018  . Leukocytosis 10/09/2016  . Essential hypertension 09/25/2016  . Major depression 09/25/2016  . Morbid obesity (HCC) 09/25/2016  . Pulmonary nodule 09/25/2016  . Herniated lumbar disc without myelopathy 04/23/2015    Past Surgical History:  Procedure Laterality Date  . ANKLE SURGERY     RIGHT  . LASIK    . LUMBAR LAMINECTOMY/DECOMPRESSION MICRODISCECTOMY Right 02/28/2013   Procedure: LUMBAR LAMINECTOMY/DECOMPRESSION MICRODISCECTOMY 1 LEVEL;  Surgeon: Maeola HarmanJoseph Stern, MD;  Location: [facility] NEURO ORS;  Service: Neurosurgery;  Laterality: Right;  Right Lumbar Four-Five Laminectomy  . MAXIMUM ACCESS (MAS)POSTERIOR LUMBAR INTERBODY FUSION (PLIF) 1 LEVEL N/A 04/23/2015   Procedure: L4-5 Maximum access posterior lumbar interbody fusion;  Surgeon: Maeola HarmanJoseph Stern, MD;  Location: [facility] NEURO ORS;  Service: Neurosurgery;  Laterality: N/A;  L4-5 Maximum access  posterior lumbar interbody fusion  . RADIOLOGY WITH ANESTHESIA N/A 02/05/2015   Procedure: MRI - Lumbar Spine;  Surgeon: Medication Radiologist, MD;  Location: [facility] OR;  Service: Radiology;  Laterality: N/A;  . TONSILLECTOMY     AS CHILD          Home Medications    Prior to Admission medications   Medication Sig Start Date End Date Taking? Authorizing Provider  albuterol (PROVENTIL HFA;VENTOLIN HFA) 108 (90 Base) MCG/ACT inhaler Inhale 2 puffs into the lungs every 6 (six) hours as needed for shortness of breath. 10/08/17   Marcelyn BruinsPadgett, Shaylar Patricia, MD  amLODipine (NORVASC) 10 MG tablet Take 10 mg by mouth daily. 11/29/15   [provider]  Earlie ServerBREO ELLIPTA 200-25 MCG/INH AEPB TAKE 1 PUFF BY MOUTH EVERY DAY 10/18/18   Marcelyn BruinsPadgett, Shaylar Patricia, MD  diclofenac (VOLTAREN) 50 MG EC tablet Take 50 mg by mouth 2 (two) times daily.    [provider]  DUPIXENT 300 MG/2ML SOSY INJECT 600MG  (2 SYRINGES)  SUBCUTANEOUSLY ON DAY 1,  300MG  (1 SYRINGE) DAY 15,  THEN EVERY OTHER WEEK  THEREAFTER 02/13/19   Alfonse SpruceGallagher, Joel Louis, MD  furosemide (LASIX) 40 MG tablet  09/26/18   [provider]  INCRUSE ELLIPTA 62.5 MCG/INH AEPB TAKE 1 PUFF BY MOUTH EVERY DAY 02/06/19   Marcelyn BruinsPadgett, Shaylar Patricia, MD  ipratropium-albuterol (DUONEB) 0.5-2.5 (3) MG/3ML SOLN TAKE 3 MLS BY NEBULIZATION EVERY 6 (SIX) HOURS AS NEEDED. 02/13/19   Marcelyn BruinsPadgett, Shaylar Patricia, MD  Mepolizumab (NUCALA [place]) Inject into the skin. Every 4 weeks    [provider]  montelukast (SINGULAIR) 10 MG tablet Take 1 tablet (10 mg total) by mouth at bedtime. Patient not taking: Reported on 01/18/2019 09/25/16   Marcelyn BruinsPadgett, Shaylar Patricia, MD  sertraline (ZOLOFT) 50 MG tablet Take 50 mg by mouth daily.    [provider]  valsartan (DIOVAN) 320 MG tablet Take 320 mg by mouth daily.     [provider]    Family History Family History  Problem Relation Age of Onset  . Hypertension Mother   . Breast cancer  Mother   . Diabetes Mother   . Hypertension Father   . Stroke Father   . Allergic rhinitis Neg Hx   . Angioedema Neg Hx   . Asthma Neg Hx   . Eczema Neg Hx   . Immunodeficiency Neg Hx   . Urticaria Neg Hx   . Atopy Neg Hx     Social History Social History   Tobacco Use  . Smoking status: Former Games developermoker  . Smokeless tobacco: Never Used  Substance Use Topics  . Alcohol use: Yes    Comment: SOCIAL   . Drug use: No     Allergies   Lisinopril   Review of Systems Review of Systems  Constitutional: Negative for chills and fever.  Respiratory: Negative for shortness of breath.   Cardiovascular: Negative for chest pain.  Musculoskeletal: Positive for myalgias.  Skin: Positive for wound.  Neurological: Negative for dizziness, syncope, weakness, light-headedness, numbness and headaches.  All other systems reviewed and are negative.    Physical Exam Updated Vital Signs BP (!) 168/105 (BP Location: Right Arm)   Pulse 94   Temp 99 F (37.2 C) (Oral)   Resp 16   Ht 5\' 8"  (1.727 m)   Wt (!) 158.8 kg   SpO2 98%   BMI 53.22 kg/m   Physical Exam Vitals signs and nursing note reviewed.  Constitutional:      General: He is not in acute distress.    Appearance: He is not ill-appearing or toxic-appearing.  HENT:     Head: Normocephalic and atraumatic. No raccoon eyes or Battle's sign.  Cardiovascular:     Rate and Rhythm: Normal rate and regular rhythm.     Pulses:          Dorsalis pedis pulses are 2+ on the right side and 2+ on the left side.       Posterior tibial pulses are 2+ on the right side and 2+ on the left side.  Pulmonary:     Effort: Pulmonary effort is normal.     Breath sounds: Normal breath sounds.  Abdominal:     General: There is no distension.     Tenderness: There is no abdominal tenderness.  Musculoskeletal:     Comments: Lower extremities: Anterior left lower leg: 13 cm long laceration that is 6 mm in depth. Curved in shape. No obvious FB. No  obvious muscle/tendon involvement. Otherwise no significant open wounds. No obvious deformity. Patient has intact AROM to bilateral hips, knees, ankles, and all digits. Tender over the left anterior mid & distal tib/fib. Tender over the R forefoot diffusely. Otherwise nontender.   Skin:    General: Skin is warm and dry.     Capillary Refill: Capillary refill takes less than 2 seconds.  Neurological:     Mental Status: He is alert.     Comments: Alert. Clear speech. Sensation grossly intact  to bilateral lower extremities. 5/5 strength with plantar/dorsiflexion bilaterally. Patient ambulatory no foot drop noted.   Psychiatric:        Mood and Affect: Mood normal.        Behavior: Behavior normal.      ED Treatments / Results  Labs (all labs ordered are listed, but only abnormal results are displayed) Labs Reviewed - No data to display  EKG None  Radiology No results found.  Procedures .Marland KitchenLaceration Repair  Date/Time: 04/03/2019 10:37 AM Performed by: Amaryllis Dyke, PA-C Authorized by: Amaryllis Dyke, PA-C   Consent:    Consent obtained:  Verbal   Consent given by:  Patient   Risks discussed:  Infection, need for additional repair, nerve damage, poor wound healing, poor cosmetic result, pain, retained foreign body, tendon damage and vascular damage   Alternatives discussed:  No treatment Anesthesia (see MAR for exact dosages):    Anesthesia method:  Local infiltration   Local anesthetic:  Lidocaine 1% w/o epi Laceration details:    Location:  Leg   Leg location:  L lower leg   Length (cm):  13   Depth (mm):  5 Repair type:    Repair type:  Simple Pre-procedure details:    Preparation:  Patient was prepped and draped in usual sterile fashion and imaging obtained to evaluate for foreign bodies Exploration:    Hemostasis achieved with:  Direct pressure   Wound exploration: wound explored through full range of motion and entire depth of wound probed and  visualized     Contaminated: no   Treatment:    Area cleansed with:  Betadine   Amount of cleaning:  Standard   Irrigation solution:  Sterile water   Irrigation method:  Pressure wash Skin repair:    Repair method:  Sutures   Suture size:  4-0   Suture material:  Nylon   Suture technique:  Simple interrupted   Number of sutures:  15 Approximation:    Approximation:  Close Post-procedure details:    Dressing:  Antibiotic ointment and non-adherent dressing   Patient tolerance of procedure:  Tolerated well, no immediate complications   (including critical care time)  Medications Ordered in ED Medications  lidocaine (PF) (XYLOCAINE) 1 % injection 15 mL (has no administration in time range)  Tdap (BOOSTRIX) injection 0.5 mL (has no administration in time range)    Initial Impression / Assessment and Plan / ED Course  I have reviewed the triage vital signs and the nursing notes.  Pertinent labs & imaging results that were available during my care of the patient were reviewed by me and considered in my medical decision making (see chart for details).    Patient presents to the emergency department with laceration to left lower which occurred which occurred shortly PTA. Patient nontoxic appearing, resting comfortably, BP elevated- doubt HTN emergency, PCP recheck. X-ray obtained in area of laceration, no fractures/dislocations or apparent radiopaque foreign bodies. Pressure irrigation performed. Wound explored and base of wound visualized in a bloodless field without evidence of foreign body. Laceration repair per procedure note above, tolerated well. NVI distally prior to and following procedure- good strength w/ plantar/dorsiflexion, 2+ pulses, sensation intact. Tetanus updated at today's visit. Initial plan for R foot x-ray as patient was having diffuse tenderness about the forefoot- patient refused- states he does not want this x-ray- understands risks/benefits, NVI distally. Discussed  suture home care as well as need for wound recheck and suture removal in 10 days.  I  discussed results, treatment plan, need for follow-up, and return precautions with the patient including signs of infection. Provided opportunity for questions, patient confirmed understanding and is in agreement with plan.    Findings and plan of care discussed with supervising physician Dr. Jeraldine LootsLockwood who evaluated patient & in agreement.   Final Clinical Impressions(s) / ED Diagnoses   Final diagnoses:  Laceration of left lower leg, initial encounter    ED Discharge Orders    None       Cherly Andersonetrucelli,  R, PA-C 04/03/19 1100    Gerhard MunchLockwood, Robert, MD 04/04/19 [phone]

## 2019-04-03 NOTE — Discharge Instructions (Signed)
You were seen in the emergency department today for a laceration. Your laceration was closed with 15 stitches. Please keep this area clean and dry for the next 24 hours, after 24 hours you may get this area wet, but avoid soaking the area. Keep the area covered as best possible especially when in the sun to help in minimizing scarring.  Your x-ray did not show any fracture or dislocation of the left lower leg. Your tetanus has been updated  You will need to have the stitches removed and the wound rechecked in 10 days. Please return to the emergency department, go to an urgent care, or see your primary care provider to have this performed. Return to the ER soon should you start to experience pus type drainage from the wound, redness around the wound, or fevers as this could indicate the area is infected, please return to the ER for any other worsening symptoms or concerns that you may have.    Please also follow-up with primary care for recheck of your blood pressure as it was elevated in the emergency department today. Vitals:   04/03/19 0843 04/03/19 0845  BP:  (!) 168/105  Pulse: 94   Resp: 16   Temp: 99 F (37.2 C)   SpO2: 98%

## 2019-04-06 ENCOUNTER — Other Ambulatory Visit: Payer: Self-pay

## 2019-04-06 ENCOUNTER — Ambulatory Visit (INDEPENDENT_AMBULATORY_CARE_PROVIDER_SITE_OTHER): Payer: 59 | Admitting: *Deleted

## 2019-04-06 DIAGNOSIS — J4550 Severe persistent asthma, uncomplicated: Secondary | ICD-10-CM | POA: Diagnosis not present

## 2019-04-10 ENCOUNTER — Ambulatory Visit: Payer: Self-pay

## 2019-04-14 ENCOUNTER — Ambulatory Visit: Payer: 59 | Admitting: Allergy

## 2019-04-21 ENCOUNTER — Ambulatory Visit: Payer: Self-pay

## 2019-04-28 ENCOUNTER — Other Ambulatory Visit: Payer: Self-pay | Admitting: Gastroenterology

## 2019-05-01 ENCOUNTER — Ambulatory Visit (INDEPENDENT_AMBULATORY_CARE_PROVIDER_SITE_OTHER): Payer: 59 | Admitting: *Deleted

## 2019-05-01 ENCOUNTER — Other Ambulatory Visit: Payer: Self-pay

## 2019-05-01 DIAGNOSIS — J4550 Severe persistent asthma, uncomplicated: Secondary | ICD-10-CM

## 2019-05-04 ENCOUNTER — Other Ambulatory Visit: Payer: Self-pay | Admitting: Allergy

## 2019-05-04 DIAGNOSIS — J4550 Severe persistent asthma, uncomplicated: Secondary | ICD-10-CM

## 2019-05-04 NOTE — Telephone Encounter (Signed)
Courtesy refill  

## 2019-05-16 ENCOUNTER — Ambulatory Visit (INDEPENDENT_AMBULATORY_CARE_PROVIDER_SITE_OTHER): Payer: 59 | Admitting: *Deleted

## 2019-05-16 DIAGNOSIS — J4550 Severe persistent asthma, uncomplicated: Secondary | ICD-10-CM

## 2019-05-16 MED ORDER — DUPILUMAB 300 MG/2ML ~~LOC~~ SOSY
300.0000 mg | PREFILLED_SYRINGE | SUBCUTANEOUS | Status: DC
  Administered 2019-05-16 – 2019-08-10 (×7): 300 mg via SUBCUTANEOUS

## 2019-05-25 ENCOUNTER — Ambulatory Visit: Payer: 59 | Admitting: Podiatry

## 2019-05-26 ENCOUNTER — Encounter: Payer: Self-pay | Admitting: Podiatry

## 2019-05-26 ENCOUNTER — Other Ambulatory Visit: Payer: Self-pay

## 2019-05-26 ENCOUNTER — Ambulatory Visit (INDEPENDENT_AMBULATORY_CARE_PROVIDER_SITE_OTHER): Payer: 59 | Admitting: Podiatry

## 2019-05-26 ENCOUNTER — Ambulatory Visit (INDEPENDENT_AMBULATORY_CARE_PROVIDER_SITE_OTHER): Payer: 59

## 2019-05-26 DIAGNOSIS — M779 Enthesopathy, unspecified: Secondary | ICD-10-CM | POA: Diagnosis not present

## 2019-05-26 DIAGNOSIS — M205X1 Other deformities of toe(s) (acquired), right foot: Secondary | ICD-10-CM

## 2019-05-29 ENCOUNTER — Other Ambulatory Visit: Payer: Self-pay | Admitting: Podiatry

## 2019-05-29 DIAGNOSIS — M779 Enthesopathy, unspecified: Secondary | ICD-10-CM

## 2019-05-29 NOTE — Progress Notes (Signed)
Subjective:   Patient ID: Don [name], male   DOB: 53 y.o.   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 and also is morbidly obese which is complicating factor   ROS      Objective:  Physical Exam  Neurovascular status intact with exquisite discomfort first interspace right and mildly into the second MPJ with excellent range of motion no crepitus of the first MPJ after having had surgery     Assessment:  Overall appears to be doing well and it appears it is more related to the inflammatory condition or inflammatory capsulitis that related to the hallux limitus     Plan:  H&P x-ray reviewed and today I did sterile prep and injected the first intermetatarsal space 3 mg Kenalog 5 mg Xylocaine advised on soaks therapy and reappoint 2 weeks  X-ray indicates there is some stress on the joint secondary to obesity but clinically it is functioning very well and I did not note any pathology around the second MPJ

## 2019-05-30 ENCOUNTER — Institutional Professional Consult (permissible substitution): Payer: Self-pay | Admitting: Pulmonary Disease

## 2019-06-01 ENCOUNTER — Ambulatory Visit (INDEPENDENT_AMBULATORY_CARE_PROVIDER_SITE_OTHER): Payer: 59 | Admitting: *Deleted

## 2019-06-01 DIAGNOSIS — J4550 Severe persistent asthma, uncomplicated: Secondary | ICD-10-CM | POA: Diagnosis not present

## 2019-06-03 ENCOUNTER — Other Ambulatory Visit: Payer: Self-pay | Admitting: Allergy

## 2019-06-03 DIAGNOSIS — J4550 Severe persistent asthma, uncomplicated: Secondary | ICD-10-CM

## 2019-06-06 ENCOUNTER — Telehealth: Payer: Self-pay | Admitting: *Deleted

## 2019-06-06 NOTE — Telephone Encounter (Signed)
Tried to reach patient on cell number and voicemail not set up.  Called and L/m for patient needs MD appt for reapproval for Dupixent. Has not had F/U appt since starting therapy in April

## 2019-06-13 ENCOUNTER — Ambulatory Visit (HOSPITAL_COMMUNITY): Admit: 2019-06-13 | Payer: Self-pay | Admitting: Gastroenterology

## 2019-06-13 ENCOUNTER — Encounter (HOSPITAL_COMMUNITY): Payer: Self-pay

## 2019-06-13 SURGERY — COLONOSCOPY WITH PROPOFOL
Anesthesia: Monitor Anesthesia Care

## 2019-06-15 ENCOUNTER — Ambulatory Visit: Payer: 59 | Admitting: Allergy

## 2019-06-16 ENCOUNTER — Other Ambulatory Visit: Payer: Self-pay

## 2019-06-16 ENCOUNTER — Ambulatory Visit (INDEPENDENT_AMBULATORY_CARE_PROVIDER_SITE_OTHER): Payer: 59 | Admitting: *Deleted

## 2019-06-16 ENCOUNTER — Ambulatory Visit: Payer: 59 | Admitting: Podiatry

## 2019-06-16 DIAGNOSIS — J4550 Severe persistent asthma, uncomplicated: Secondary | ICD-10-CM

## 2019-06-23 ENCOUNTER — Other Ambulatory Visit: Payer: Self-pay

## 2019-06-23 ENCOUNTER — Ambulatory Visit (INDEPENDENT_AMBULATORY_CARE_PROVIDER_SITE_OTHER): Payer: 59 | Admitting: Allergy

## 2019-06-23 ENCOUNTER — Encounter: Payer: Self-pay | Admitting: Allergy

## 2019-06-23 VITALS — BP 150/82 | HR 90 | Temp 97.3°F | Resp 16 | Ht 68.0 in | Wt 351.0 lb

## 2019-06-23 DIAGNOSIS — J4550 Severe persistent asthma, uncomplicated: Secondary | ICD-10-CM | POA: Diagnosis not present

## 2019-06-23 DIAGNOSIS — L309 Dermatitis, unspecified: Secondary | ICD-10-CM | POA: Diagnosis not present

## 2019-06-23 DIAGNOSIS — J3089 Other allergic rhinitis: Secondary | ICD-10-CM | POA: Diagnosis not present

## 2019-06-23 MED ORDER — PIMECROLIMUS 1 % EX CREA: TOPICAL_CREAM | Freq: Two times a day (BID) | CUTANEOUS | 0 refills | Status: DC

## 2019-06-23 MED ORDER — PAZEO 0.7 % OP SOLN: 1.0000 [drp] | Freq: Every day | OPHTHALMIC | 3 refills | Status: DC | PRN

## 2019-06-23 NOTE — Progress Notes (Signed)
Follow-up Note  RE: Don [name] MRN: [id number] DOB: 07-03-1966 Date of Office Visit: 06/23/2019   History of present illness: Don [name] is a 53 y.o. male presenting today for follow-up of severe persistent asthma.  He had a televisit on 01/18/2019 by myself.   He was changed to [place] to see if we could get better control of his asthma symptoms.  He feels his day-to-day symptoms are better but he still reports SOB depending on the activity she does on the day.  He states however over the last 3 months that he has been out of work as he had a left leg injury that became infected and he is in needing to have the wound "scrapings" and changes.  Thus he has not needed to be as active as he usually does when he is able to work.  He does note that his albuterol use is less than previous.  He also notes that he is not having the joint and body pain that he was having before and is less fatigued.  He feels that either the trial of Incruse that he did months ago or the Berna Bue was causing the pain and fatigue.  He states he overall feels much better.  Due to COVID and the leg injury his pulmonary appointment was canceled.  He continues to come to the office every 2 weeks for injections of Breo as he states he is not able to inject himself.  He continues on Breo 200 mcg 1 puff daily as well as singular daily. He has not required any further systemic steroids since his last visit.  He denies any nighttime awakenings.  He states he has been having some watery eyes.  He also states he has been developing a itchy type red blotchy rash on his face mostly when he has been outside in the sun and sweating.  He states that showering also tends to make the rash worse.  He states that the rash then tends to get dry and flaky.  Review of systems: Review of Systems  Constitutional: Negative for chills, fever, malaise/fatigue and weight loss.  HENT: Negative for congestion, ear discharge, nosebleeds and sore  throat.   Eyes: Negative for pain, discharge and redness.  Respiratory: Positive for cough and shortness of breath.   Cardiovascular: Negative for chest pain.  Gastrointestinal: Negative for abdominal pain, constipation, diarrhea, nausea and vomiting.  Musculoskeletal: Positive for joint pain.  Skin: Positive for itching and rash.  Neurological: Negative for headaches.    All other systems negative unless noted above in HPI  Past medical/social/surgical/family history have been reviewed and are unchanged unless specifically indicated below.  No changes  Medication List: Allergies as of 06/23/2019      Reactions   Lisinopril Other (See Comments)   Muscle aches and fatigue.       Medication List       Accurate as of June 23, 2019  1:33 PM. If you have any questions, ask your nurse or doctor.        STOP taking these medications   cephALEXin 500 MG capsule Commonly known as: KEFLEX Stopped by: Shaylar Charmian Muff, MD   Incruse Ellipta 62.5 MCG/INH Aepb Generic drug: umeclidinium bromide Stopped by: Shaylar Charmian Muff, MD   montelukast 10 MG tablet Commonly known as: SINGULAIR Stopped by: Shaylar Charmian Muff, MD   [place] by: Shaylar Charmian Muff, MD     TAKE these medications   albuterol 108 (90 Base) MCG/ACT  inhaler Commonly known as: VENTOLIN HFA Inhale 2 puffs into the lungs every 6 (six) hours as needed for shortness of breath.   amLODipine 10 MG tablet Commonly known as: NORVASC Take 10 mg by mouth daily.   Breo Ellipta 200-25 MCG/INH Aepb Generic drug: fluticasone furoate-vilanterol Inhale 1 puff into the lungs daily. Rinse, gargle, and spit after use.   [employer] Elastic Bandage 4" Misc See admin instructions.   [employer] GAUZE PAD STERILE 4"X4" 4"X4" Pads See admin instructions.   [employer]S Rolled Gauze Misc See admin instructions.   [employer] Saline Wound Wash 0.9 % Soln Generic drug: SODIUM CHLORIDE (EXTERNAL) See admin  instructions.   diclofenac 50 MG EC tablet Commonly known as: VOLTAREN Take 50 mg by mouth 2 (two) times daily.   Dupixent 300 MG/2ML prefilled syringe Generic drug: dupilumab INJECT 600MG  (2 SYRINGES)  SUBCUTANEOUSLY ON DAY 1,  300MG  (1 SYRINGE) DAY 15,  THEN EVERY OTHER WEEK  THEREAFTER   furosemide 40 MG tablet Commonly known as: LASIX   ipratropium-albuterol 0.5-2.5 (3) MG/3ML Soln Commonly known as: DUONEB TAKE 3 MLS BY NEBULIZATION EVERY 6 (SIX) HOURS AS NEEDED.   Pazeo 0.7 % Soln Generic drug: Olopatadine HCl Place 1 drop into both eyes daily as needed. Started by: Shaylar , MD   pimecrolimus 1 % cream Commonly known as: Elidel Apply topically 2 (two) times daily. Started by: Shaylar , MD   sertraline 50 MG tablet Commonly known as: ZOLOFT Take 50 mg by mouth daily.   valsartan 320 MG tablet Commonly known as: DIOVAN Take 320 mg by mouth daily.       Known medication allergies: Allergies  Allergen Reactions  . Lisinopril Other (See Comments)    Muscle aches and fatigue.      Physical examination: Blood pressure (!) 150/82, pulse 90, temperature (!) 97.3 F (36.3 C), temperature source Temporal, resp. rate 16, height 5\' 8"  (1.727 m), weight (!) 351 lb (159.2 kg), SpO2 94 %.  General: Alert, interactive, in no acute distress. HEENT: PERRLA, TMs pearly gray, turbinates minimally edematous without discharge, post-pharynx non erythematous. Neck: Supple without lymphadenopathy. Lungs: Clear to auscultation without wheezing, rhonchi or rales. {no increased work of breathing. CV: Normal S1, S2 without murmurs. Abdomen: Nondistended, nontender. Skin: Faint erythematous blotches across his forehead and cheeks. Extremities:  No clubbing, cyanosis or edema. Neuro:   Grossly intact.  Diagnositics/Labs:  Spirometry: FEV1: 1.97L 54%, FVC: 2.26L 48% severe restrictive pattern.  2 of the 3 attempts he had coughing visualized on the  flow volume loop  Assessment and plan: Severe persistent asthma - appears to have less symptoms since starting on Dupixent.  He also reports less fatigue and bony/joint paint since the change to Dupixent.   He had developed possible side effects related to Nucala and/or Incruse and has stopped both of these agents.  He also does not feel that Nucala or Incruse has provided him any better control than Fasenra which he continued to have flares on.   He will continue on Breo and singulair.   I still would like for him to see pulmonary for sleep apnea evaluation. Allergic rhinitis - continue use of nasal spray as directed by ENT.   Facial dermatitis - rash appears somewhat eczematous and will try Elidel for control    1.  Every day utilize the following medications:   A.  Breo 200 -1 inhalation 1 time per day  B.  Nasal steroid spray as directed by ENT  C.  Montelukast 10 mg -1 tablet 1 time per day  2.  Continue Dupixent injections every 2 weeks   3.  If needed:   A.  Albuterol nebulization or MDI 2 inhalations every 4-6 hours  B.  OTC antihistamine  C.  Nasal saline  D.  Pazeo 1 drop each eye daily as needed  4. Elidel can be use 1-2 times a day for facial dermatitis as needed  5.  Return to clinic in 3-4 months or earlier if problem  I appreciate the opportunity to take part in Don [name]'s care. Please do not hesitate to contact me with questions.  Sincerely,   Margo AyeShaylar Padgett, MD Allergy/Immunology [facility]

## 2019-06-23 NOTE — Patient Instructions (Addendum)
1.  Every day utilize the following medications:   A.  Breo 200 -1 inhalation 1 time per day  B.  Nasal steroid spray as directed by ENT  C.  Montelukast 10 mg -1 tablet 1 time per day  2.  Continue Dupixent injections every 2 weeks   3.  If needed:   A.  Albuterol nebulization or MDI 2 inhalations every 4-6 hours  B.  OTC antihistamine  C.  Nasal saline  D.  Pazeo 1 drop each eye daily as needed  4. Elidel can be use 1-2 times a day for facial dermatitis as needed  5.  Return to clinic in 3-4 months or earlier if problem

## 2019-06-29 ENCOUNTER — Other Ambulatory Visit: Payer: Self-pay

## 2019-06-29 ENCOUNTER — Ambulatory Visit (INDEPENDENT_AMBULATORY_CARE_PROVIDER_SITE_OTHER): Payer: 59 | Admitting: *Deleted

## 2019-06-29 DIAGNOSIS — J4550 Severe persistent asthma, uncomplicated: Secondary | ICD-10-CM | POA: Diagnosis not present

## 2019-07-13 ENCOUNTER — Other Ambulatory Visit: Payer: Self-pay

## 2019-07-13 ENCOUNTER — Ambulatory Visit (INDEPENDENT_AMBULATORY_CARE_PROVIDER_SITE_OTHER): Payer: 59

## 2019-07-13 DIAGNOSIS — J4550 Severe persistent asthma, uncomplicated: Secondary | ICD-10-CM

## 2019-07-14 ENCOUNTER — Ambulatory Visit: Payer: Self-pay

## 2019-07-24 ENCOUNTER — Telehealth: Payer: Self-pay | Admitting: Allergy

## 2019-07-24 NOTE — Telephone Encounter (Signed)
Wife called and said a few months back, Dr. Nelva Bush said she was going to have a referral sent to a pulmonologist. Wife said, Lucca had a leg injury and they were dealing with that for a few months and she never followed up or heard where he had been referred to. She would like to know if anything was done with this referral.

## 2019-07-24 NOTE — Telephone Encounter (Signed)
Can we resubmit the referral to pulmonology for sleep apnea. Per the last one, which is closed now, patient stated that he felt better.

## 2019-07-25 NOTE — Telephone Encounter (Signed)
Yes same referral reason but would make the sleep apnea evaluation primary reason and severe persistent asthma on dupixent secondary.   Yes I did address this with pt last visit and he too said due to his leg injury he was unable to go to the referral and also that he felt like his asthma was doing better with change to [place].

## 2019-07-25 NOTE — Telephone Encounter (Signed)
Hey Dr Nelva Bush,   It looks like the last time he was referred it was for [facility] Evaluation. Would you like that to be the reason this time?   Thanks

## 2019-07-26 NOTE — Telephone Encounter (Signed)
I have placed a referral to Dr Loanne Drilling with [facility].  I called and left a voicemail on the home phone as the mobile phone did not have a VM set up.   I will follow back up with the referral in a few days.  Thanks

## 2019-07-27 ENCOUNTER — Ambulatory Visit: Payer: 59 | Admitting: Podiatry

## 2019-07-27 ENCOUNTER — Ambulatory Visit (INDEPENDENT_AMBULATORY_CARE_PROVIDER_SITE_OTHER): Payer: 59 | Admitting: *Deleted

## 2019-07-27 ENCOUNTER — Other Ambulatory Visit: Payer: Self-pay

## 2019-07-27 DIAGNOSIS — J4550 Severe persistent asthma, uncomplicated: Secondary | ICD-10-CM

## 2019-07-28 ENCOUNTER — Encounter: Payer: Self-pay | Admitting: Podiatry

## 2019-07-28 ENCOUNTER — Other Ambulatory Visit: Payer: Self-pay

## 2019-07-28 ENCOUNTER — Ambulatory Visit: Payer: 59 | Admitting: Podiatry

## 2019-07-28 DIAGNOSIS — M7661 Achilles tendinitis, right leg: Secondary | ICD-10-CM | POA: Diagnosis not present

## 2019-07-28 MED ORDER — MELOXICAM 15 MG PO TABS: 15.0000 mg | ORAL_TABLET | Freq: Every day | ORAL | 0 refills | Status: DC

## 2019-07-28 MED ORDER — METHYLPREDNISOLONE 4 MG PO TBPK: ORAL_TABLET | ORAL | 0 refills | Status: DC

## 2019-07-28 NOTE — Progress Notes (Addendum)
This patient presents the office with chief complaint of pain in the back of his right heel.  He says that the pain has been present for approximately 4 weeks.  He denies any history of trauma or injury to the foot.  He believes he may have started wearing his foot gear.  He says since last Sunday the pain has become severe and he has been unable to bear weight and walk without a cane.  He says he has severe pain also getting out of bed.  He presents the office stating that he has pain and swelling in the back of his right heel which causes him to have an antalgic gait.  He presents the office today for an evaluation and treatment of his painful Achilles tendon.  Neurovascular status intact to both feet.  Scar is noted over the first metatarsal head of the right foot.  Patient also has a scar noted on the medial aspect of the right ankle.  Patient has palpable pain noted at the site of the Achilles tendon and its insertion into the retrocalcaneal area of his calcaneus.  There is increased redness swelling and pain at the site of the insertion.  Patient has pain upon dorsiflexion of the foot.    Achilles tendinitis  Achilles  Right foot.    ROV.  Examination of his foot does reveal pain along the course of the Achilles tendon right foot.  Muscle testing reveal the tendon to be intact.  Xrays taken  On 8/31 reveal significant calcification at insertion plantar fascia.  Patient also has 2 K-wires noted from previous surgery.  Minimal calcification at insertion achilles tendon right foot.  Discussed this condition with this patient.  Told him I would like to prescribe Medrol dose pack for the first 5 days.  Patient is then to take Mobic 15 mg 1 daily until completed.  Patient was also told to use the cam walker and to ambulate in the cam walker during this time.  He was told to return to the office in 7 to 10 days for further evaluation and treatment.  Patient was given a note for him to not return to work.   Return to clinic 7 to 10 days.  Call the office as needed.  Gardiner Barefoot DPM

## 2019-08-04 ENCOUNTER — Other Ambulatory Visit: Payer: Self-pay

## 2019-08-04 ENCOUNTER — Ambulatory Visit: Payer: 59 | Admitting: Pulmonary Disease

## 2019-08-04 ENCOUNTER — Encounter: Payer: Self-pay | Admitting: Pulmonary Disease

## 2019-08-04 DIAGNOSIS — Z23 Encounter for immunization: Secondary | ICD-10-CM | POA: Diagnosis not present

## 2019-08-04 DIAGNOSIS — J4550 Severe persistent asthma, uncomplicated: Secondary | ICD-10-CM

## 2019-08-04 DIAGNOSIS — G4733 Obstructive sleep apnea (adult) (pediatric): Secondary | ICD-10-CM | POA: Diagnosis not present

## 2019-08-04 NOTE — Patient Instructions (Signed)
Flu shot today.  Stay on [place]. Start back on Singulair 10 mg at bedtime  Obtain home sleep test from Dr. Maxwell Caul office. Schedule CPAP titration study, based on this we will set you with CPAP We discussed alternative treatments for sleep apnea

## 2019-08-04 NOTE — Assessment & Plan Note (Addendum)
Obtain home sleep test from Dr. Maxwell Caul office. Schedule CPAP titration study, based on this we will set you with CPAP We discussed alternative treatments for sleep apnea -I doubt he would be a candidate for oral appliance or hypoglossal nerve stimulation   The pathophysiology of obstructive sleep apnea , it's cardiovascular consequences & modes of treatment including CPAP were discused with the patient in detail & they evidenced understanding. Weight loss encouraged, compliance with goal of at least 4-6 hrs every night is the expectation. Advised against medications with sedative side effects Cautioned against driving when sleepy - understanding that sleepiness will vary on a day to day basis

## 2019-08-04 NOTE — Assessment & Plan Note (Signed)
Flu shot today.  Stay on [place]. Start back on Singulair 10 mg at bedtime

## 2019-08-04 NOTE — Addendum Note (Signed)
Addended by: Valerie Salts on: 08/04/2019 12:18 PM   Modules accepted: Orders

## 2019-08-04 NOTE — Progress Notes (Signed)
Subjective:    Patient ID: Don [name], male    DOB: [birth year], 53 y.o.   MRN: [id number]  HPI  Chief Complaint  Patient presents with  . Consult    Sleep Apnea, Severe Persistent Asthma    53 year old obese man presents to establish care for management of asthma and OSA. He has been seen Dr. Delorse Lek allergist -results of his allergy testing and IgE are not available to me at the time of dictation. 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  He reports onset of asthma in his 19s after he quit smoking-he does not particularly associated this with his move to [place].  He has been maintained on some inhaler or the other since then, currently on Breo daily and has albuterol for use as needed.  He also has duo nebs which she has not needed in the past few months. He has had 2 flareups this year requiring prednisone taper. He has been maintained on Biologics, initially Nucala was tried and then [place] and these were both discontinued due to inefficacy rather than side effects -although he did have bone pains with 1 of these Biologics.  He is currently maintained on Dupixent which he takes every 2 weeks cardiologist office and he is not sure whether this is helping him or not.  A long time ago he used to be on Singulair but has not taken this lately. I reviewed all his spirometry records with this and this mainly seems to suggest restrictive lung disease with FEV1/FVC ratio in the normal range, although FVC seems to vary from as high as 87% to as low as 45 to 50%  He has hypertension requiring 2 medications.  Epworth sleepiness score is 8.  He reports some degree of excessive daytime somnolence and fatigue. Bedtime is around 9 PM, he sleeps prone in bed with 1 pillow and will occasionally flip over on his side.  His bed is able to incline.  He reports 3-4 nocturnal awakenings including nocturia  and is out of bed by 5 AM on a workday feeling rested with dryness of mouth and occasional headaches. There is no history suggestive of cataplexy, sleep paralysis or parasomnias  He denies excessive use of caffeinated beverages. He had a home sleep test by Dr. Allie Dimmer and was started on auto CPAP with a full facemask.  It seems like he had some falling out with the DME company and he shows me a letter that Dr. Allie Dimmer wrote to him about his verbal exchanges with the DME and office staff and he was dismissed from their practice.  He is very reluctant about trying CPAP again based on this prior experience with DME.  He also reports that he had difficulty exhaling against the pressure on the PAP machine  He has always been on the heavier side over the last 20 years but does report a 10 to 20 pound weight gain during the pandemic due to being more sedentary.  He has been out of work for several more weeks this year initially due to right toe surgery and then a left leg injury complicated by wound infection   Significant tests/ events reviewed  05/2019 spirometry-ratio 87, FEV1 1.97-54%, FVC 2.2 648% 09/2018 spirometry-ratio 80, FEV1 86%, FVC 84% 09/2018 eos 0     Past Medical History:  Diagnosis Date  . Asthma   . Claustrophobia   .  Depression   . Hypertension   . Kidney stone    YRS AGO   PASSED ON OWN    Past Surgical History:  Procedure Laterality Date  . ANKLE SURGERY     RIGHT  . LASIK    . LUMBAR LAMINECTOMY/DECOMPRESSION MICRODISCECTOMY Right 02/28/2013   Procedure: LUMBAR LAMINECTOMY/DECOMPRESSION MICRODISCECTOMY 1 LEVEL;  Surgeon: Maeola HarmanJoseph Stern, MD;  Location: [facility];  Service: Neurosurgery;  Laterality: Right;  Right Lumbar Four-Five Laminectomy  . MAXIMUM ACCESS (MAS)POSTERIOR LUMBAR INTERBODY FUSION (PLIF) 1 LEVEL N/A 04/23/2015   Procedure: L4-5 Maximum access posterior lumbar interbody fusion;  Surgeon: Maeola HarmanJoseph Stern, MD;  Location: [facility];  Service: Neurosurgery;   Laterality: N/A;  L4-5 Maximum access posterior lumbar interbody fusion  . RADIOLOGY WITH ANESTHESIA N/A 02/05/2015   Procedure: MRI - Lumbar Spine;  Surgeon: Medication Radiologist, MD;  Location: [facility];  Service: Radiology;  Laterality: N/A;  . TONSILLECTOMY     AS CHILD      Allergies  Allergen Reactions  . Lisinopril Other (See Comments)    Muscle aches and fatigue.   Social History   Socioeconomic History  . Marital status: Married    Spouse name: Not on file  . Number of children: 3  . Years of education: Not on file  . Highest education level: Not on file  Occupational History  . Occupation: Freight forwarderLandscaping    Employer: [employer]  Social Needs  . Financial resource strain: Not on file  . Food insecurity    Worry: Not on file    Inability: Not on file  . Transportation needs    Medical: Not on file    Non-medical: Not on file  Tobacco Use  . Smoking status: Former Games developermoker  . Smokeless tobacco: Never Used  Substance and Sexual Activity  . Alcohol use: Yes    Comment: SOCIAL   . Drug use: No  . Sexual activity: Not on file  Lifestyle  . Physical activity    Days per week: Not on file    Minutes per session: Not on file  . Stress: Not on file  Relationships  . Social Musicianconnections    Talks on phone: Not on file    Gets together: Not on file    Attends religious service: Not on file    Active member of club or organization: Not on file    Attends meetings of clubs or organizations: Not on file    Relationship status: Not on file  . Intimate partner violence    Fear of current or ex partner: Not on file    Emotionally abused: Not on file    Physically abused: Not on file    Forced sexual activity: Not on file  Other Topics Concern  . Not on file  Social History Narrative  . Not on file     Family History  Problem Relation Age of Onset  . Hypertension Mother   . Breast cancer Mother   . Diabetes Mother   . Hypertension Father   . Stroke Father    . Allergic rhinitis Neg Hx   . Angioedema Neg Hx   . Asthma Neg Hx   . Eczema Neg Hx   . Immunodeficiency Neg Hx   . Urticaria Neg Hx   . Atopy Neg Hx       Review of Systems  Constitutional: negative for anorexia, fevers and sweats  Eyes: negative for irritation, redness and visual disturbance  Ears, nose, mouth,  throat, and face: negative for earaches, epistaxis, nasal congestion and sore throat  Respiratory: negative for cough, sputum and wheezing  Cardiovascular: negative for chest pain,  lower extremity edema, orthopnea, palpitations and syncope  Gastrointestinal: negative for abdominal pain, constipation, diarrhea, melena, nausea and vomiting  Genitourinary:negative for dysuria, frequency and hematuria  Hematologic/lymphatic: negative for bleeding, easy bruising and lymphadenopathy  Musculoskeletal:negative for arthralgias, muscle weakness and stiff joints  Neurological: negative for coordination problems, gait problems, headaches and weakness  Endocrine: negative for diabetic symptoms including polydipsia, polyuria and weight loss     Objective:   Physical Exam  Gen. Pleasant, obese, in no distress, normal affect ENT - no pallor,icterus, no post nasal drip, class 2-3 airway Neck: No JVD, no thyromegaly, no carotid bruits Lungs: no use of accessory muscles, no dullness to percussion, decreased without rales or rhonchi  Cardiovascular: Rhythm regular, heart sounds  normal, no murmurs or gallops, no peripheral edema Abdomen: soft and non-tender, no hepatosplenomegaly, BS normal. Musculoskeletal: No deformities, no cyanosis or clubbing Neuro:  alert, non focal, no tremors       Assessment & Plan:

## 2019-08-09 ENCOUNTER — Ambulatory Visit: Payer: 59 | Admitting: Podiatry

## 2019-08-09 ENCOUNTER — Other Ambulatory Visit: Payer: Self-pay

## 2019-08-09 ENCOUNTER — Encounter: Payer: Self-pay | Admitting: Podiatry

## 2019-08-09 DIAGNOSIS — M7661 Achilles tendinitis, right leg: Secondary | ICD-10-CM | POA: Diagnosis not present

## 2019-08-09 MED ORDER — MELOXICAM 15 MG PO TABS: 15.0000 mg | ORAL_TABLET | Freq: Every day | ORAL | 0 refills | Status: DC

## 2019-08-09 NOTE — Progress Notes (Signed)
This patient presents the office for continued evaluation of acute Achilles tendinitis right heel.  He was seen approximately 2 weeks ago and he was treated with a Medrol Dosepak for the first 5 days and then Mobic 15 mg daily until completed.  He was also told to wear a cam walker until his pain resolves.  He presents the office today stating that his heel is much better.  He says he has completed the Medrol Dosepak and taking the Mobic presently.  He says he also has returned to work and he is not having any difficulties at work because of the pain in his right heel area.  He presents the office today for continued evaluation and treatment of his Achilles tendinitis right foot.  Neurovascular status is intact palpable pain noted at the lateral aspect of the insertion of the Achilles tendon in the right heel.  Patient does have mild increased temperature noted at the lateral insertion of the Achilles tendon.  Patient has normal range of motion in his right foot.  Patient pain level appears to be 4-5 at the site of the pain.  Achilles tendinitis right foot.  ROV.  Discussed this condition with this patient.  Told him to continue taking the Mobic since he is not having any side effects and believes the medication is still beneficial.  He was admonished to wear his cam walker after a day at work.  A refill for Mobic was called into the pharmacy for this patient.  Upon leaving the office he walked with no evidence of an antalgic gait and even jokingly ran down the hall.  He is very pleased with his progress.   Gardiner Barefoot DPM

## 2019-08-10 ENCOUNTER — Ambulatory Visit (INDEPENDENT_AMBULATORY_CARE_PROVIDER_SITE_OTHER): Payer: 59

## 2019-08-10 DIAGNOSIS — J4550 Severe persistent asthma, uncomplicated: Secondary | ICD-10-CM | POA: Diagnosis not present

## 2019-08-12 ENCOUNTER — Other Ambulatory Visit: Payer: Self-pay | Admitting: Allergy

## 2019-08-12 DIAGNOSIS — J4550 Severe persistent asthma, uncomplicated: Secondary | ICD-10-CM

## 2019-08-21 ENCOUNTER — Telehealth: Payer: Self-pay

## 2019-08-21 NOTE — Telephone Encounter (Signed)
Would like to know if he could switch to self administer Dupixent at home.  Please advise

## 2019-08-21 NOTE — Telephone Encounter (Signed)
Called and L/M for wife that patient can self-admin his Dupixent at any time.  He does not want to do with prefilled syringe but would like the pen. I advised her at this time I am unable to make switch which will require new PA and Rx but will try to address in the upcoming weeks and let her know

## 2019-08-22 NOTE — Telephone Encounter (Signed)
Ok sounds good

## 2019-08-29 ENCOUNTER — Ambulatory Visit: Payer: Self-pay

## 2019-08-31 ENCOUNTER — Ambulatory Visit (INDEPENDENT_AMBULATORY_CARE_PROVIDER_SITE_OTHER): Payer: 59 | Admitting: Adult Health

## 2019-08-31 ENCOUNTER — Other Ambulatory Visit: Payer: Self-pay | Admitting: Critical Care Medicine

## 2019-08-31 ENCOUNTER — Telehealth: Payer: Self-pay | Admitting: Pulmonary Disease

## 2019-08-31 ENCOUNTER — Telehealth: Payer: Self-pay | Admitting: Critical Care Medicine

## 2019-08-31 ENCOUNTER — Encounter: Payer: Self-pay | Admitting: Adult Health

## 2019-08-31 DIAGNOSIS — U071 COVID-19: Secondary | ICD-10-CM | POA: Diagnosis not present

## 2019-08-31 DIAGNOSIS — J4550 Severe persistent asthma, uncomplicated: Secondary | ICD-10-CM | POA: Diagnosis not present

## 2019-08-31 DIAGNOSIS — G4733 Obstructive sleep apnea (adult) (pediatric): Secondary | ICD-10-CM

## 2019-08-31 NOTE — Telephone Encounter (Signed)
Spoke with the pt  He states tested pos for covid 09/02/19- having fevers, chills, stomach upset, SOB  He is asking when he should go to the ED  I have scheduled him for televisit with TP for this afternoon at 3:30

## 2019-08-31 NOTE — Patient Instructions (Addendum)
Push fluids. Rest and activity as tolerated Mucinex DM twice daily as needed for cough and congestion. Finish steroid taper as recommended Continue on Breo 1 puff daily, rinse after use Albuterol inhaler or nebulizer as needed for wheezing. Monitor symptoms closely if develop fever, inability to keep food or fluids down.,  Shortness of breath, chest pain, confusion contact our office or seek emergency care immediately Follow-up in 1 week and as needed for video visit Please contact office for sooner follow up if symptoms do not improve or worsen or seek emergency care    .   Person Under Monitoring Name: Don [name]  Location: [address] [place] [id number]   Infection Prevention Recommendations for Individuals Confirmed to have, or Being Evaluated for, 2019 Novel Coronavirus (COVID-19) Infection Who Receive Care at Home  Individuals who are confirmed to have, or are being evaluated for, COVID-19 should follow the prevention steps below until a healthcare provider or local or state health department says they can return to normal activities.  Stay home except to get medical care You should restrict activities outside your home, except for getting medical care. Do not go to work, school, or public areas, and do not use public transportation or taxis.  Call ahead before visiting your doctor Before your medical appointment, call the healthcare provider and tell them that you have, or are being evaluated for, COVID-19 infection. This will help the healthcare provider's office take steps to keep other people from getting infected. Ask your healthcare provider to call the local or state health department.  Monitor your symptoms Seek prompt medical attention if your illness is worsening (e.g., difficulty breathing). Before going to your medical appointment, call the healthcare provider and tell them that you have, or are being evaluated for, COVID-19 infection.  Ask your healthcare provider to call the local or state health department.  Wear a facemask You should wear a facemask that covers your nose and mouth when you are in the same room with other people and when you visit a healthcare provider. People who live with or visit you should also wear a facemask while they are in the same room with you.  Separate yourself from other people in your home As much as possible, you should stay in a different room from other people in your home. Also, you should use a separate bathroom, if available.  Avoid sharing household items You should not share dishes, drinking glasses, cups, eating utensils, towels, bedding, or other items with other people in your home. After using these items, you should wash them thoroughly with soap and water.  Cover your coughs and sneezes Cover your mouth and nose with a tissue when you cough or sneeze, or you can cough or sneeze into your sleeve. Throw used tissues in a lined trash can, and immediately wash your hands with soap and water for at least 20 seconds or use an alcohol-based hand rub.  Wash your [employer]hands Wash your hands often and thoroughly with soap and water for at least 20 seconds. You can use an alcohol-based hand sanitizer if soap and water are not available and if your hands are not visibly dirty. Avoid touching your eyes, nose, and mouth with unwashed hands.   Prevention Steps for Caregivers and Household Members of Individuals Confirmed to have, or Being Evaluated for, COVID-19 Infection Being Cared for in the Home  If you live with, or provide care at home for, a person confirmed to have, or being evaluated  for, COVID-19 infection please follow these guidelines to prevent infection:  Follow healthcare provider's instructions Make sure that you understand and can help the patient follow any healthcare provider instructions for all care.  Provide for the patient's basic needs You should help the  patient with basic needs in the home and provide support for getting groceries, prescriptions, and other personal needs.  Monitor the patient's symptoms If they are getting sicker, call his or her medical provider and tell them that the patient has, or is being evaluated for, COVID-19 infection. This will help the healthcare provider's office take steps to keep other people from getting infected. Ask the healthcare provider to call the local or state health department.  Limit the number of people who have contact with the patient  If possible, have only one caregiver for the patient.  Other household members should stay in another home or place of residence. If this is not possible, they should stay  in another room, or be separated from the patient as much as possible. Use a separate bathroom, if available.  Restrict visitors who do not have an essential need to be in the home.  Keep older adults, very young children, and other sick people away from the patient Keep older adults, very young children, and those who have compromised immune systems or chronic health conditions away from the patient. This includes people with chronic heart, lung, or kidney conditions, diabetes, and cancer.  Ensure good ventilation Make sure that shared spaces in the home have good air flow, such as from an air conditioner or an opened window, weather permitting.  Wash your hands often  Wash your hands often and thoroughly with soap and water for at least 20 seconds. You can use an alcohol based hand sanitizer if soap and water are not available and if your hands are not visibly dirty.  Avoid touching your eyes, nose, and mouth with unwashed hands.  Use disposable paper towels to dry your hands. If not available, use dedicated cloth towels and replace them when they become wet.  Wear a facemask and gloves  Wear a disposable facemask at all times in the room and gloves when you touch or have contact  with the patient's blood, body fluids, and/or secretions or excretions, such as sweat, saliva, sputum, nasal mucus, vomit, urine, or feces.  Ensure the mask fits over your nose and mouth tightly, and do not touch it during use.  Throw out disposable facemasks and gloves after using them. Do not reuse.  Wash your hands immediately after removing your facemask and gloves.  If your personal clothing becomes contaminated, carefully remove clothing and launder. Wash your hands after handling contaminated clothing.  Place all used disposable facemasks, gloves, and other waste in a lined container before disposing them with other household waste.  Remove gloves and wash your hands immediately after handling these items.  Do not share dishes, glasses, or other household items with the patient  Avoid sharing household items. You should not share dishes, drinking glasses, cups, eating utensils, towels, bedding, or other items with a patient who is confirmed to have, or being evaluated for, COVID-19 infection.  After the person uses these items, you should wash them thoroughly with soap and water.  Wash laundry thoroughly  Immediately remove and wash clothes or bedding that have blood, body fluids, and/or secretions or excretions, such as sweat, saliva, sputum, nasal mucus, vomit, urine, or feces, on them.  Wear gloves when handling laundry from the  patient.  Read and follow directions on labels of laundry or clothing items and detergent. In general, wash and dry with the warmest temperatures recommended on the label.  Clean all areas the individual has used often  Clean all touchable surfaces, such as counters, tabletops, doorknobs, bathroom fixtures, toilets, phones, keyboards, tablets, and bedside tables, every day. Also, clean any surfaces that may have blood, body fluids, and/or secretions or excretions on them.  Wear gloves when cleaning surfaces the patient has come in contact with.  Use  a diluted bleach solution (e.g., dilute bleach with 1 part bleach and 10 parts water) or a household disinfectant with a label that says EPA-registered for coronaviruses. To make a bleach solution at home, add 1 tablespoon of bleach to 1 quart (4 cups) of water. For a larger supply, add  cup of bleach to 1 gallon (16 cups) of water.  Read labels of cleaning products and follow recommendations provided on product labels. Labels contain instructions for safe and effective use of the cleaning product including precautions you should take when applying the product, such as wearing gloves or eye protection and making sure you have good ventilation during use of the product.  Remove gloves and wash hands immediately after cleaning.  Monitor yourself for signs and symptoms of illness Caregivers and household members are considered close contacts, should monitor their health, and will be asked to limit movement outside of the home to the extent possible. Follow the monitoring steps for close contacts listed on the symptom monitoring form.   ? If you have additional questions, contact your local health department or call the epidemiologist on call at [phone] (available 24/7). ? This guidance is subject to change. For the most up-to-date guidance from [facility], please refer to their website: [email or web address]

## 2019-08-31 NOTE — Progress Notes (Signed)
  I connected by phone with Don [name] on 08/31/2019 at 6:41 PM to discuss the potential use of an new treatment for mild to moderate COVID-19 viral infection in non-hospitalized patients.  This patient is a 53 y.o. male that meets the FDA criteria for Emergency Use Authorization of bamlanivimab:  Has a (+) direct SARS-CoV-2 viral test result  Has mild or moderate COVID-19   Is ? 53 years of age and weighs ? 40 kg  Is NOT hospitalized due to COVID-19  Is NOT requiring oxygen therapy or requiring an increase in baseline oxygen flow rate due to COVID-19  Is within 10 days of symptom onset  Has at least one of the high risk factor(s) for progression to severe COVID-19 and/or hospitalization as defined in EUA.  Specific high risk criteria : BMI >/= 35  Patient Active Problem List   Diagnosis Date Noted  . OSA (obstructive sleep apnea) 08/04/2019  . Achilles tendinitis of right lower extremity 07/28/2019  . Asthma 10/20/2018  . Leukocytosis 10/09/2016  . Essential hypertension 09/25/2016  . Major depression 09/25/2016  . Morbid obesity ([place]) 09/25/2016  . Pulmonary nodule 09/25/2016  . Herniated lumbar disc without myelopathy 04/23/2015    I have spoken and communicated the following to the patient or parent/caregiver:  1. FDA has authorized the emergency use of bamlanivimab for the treatment of mild to moderate COVID-19 in adults and pediatric patients with positive results of direct SARS-CoV-2 viral testing who are 68 years of age and older weighing at least 40 kg, and who are at high risk for progressing to severe COVID-19 and/or hospitalization.  2. The significant known and potential risks and benefits of bamlanivimab, and the extent to which such potential risks and benefits are unknown.  3. Information on available alternative treatments and the risks and benefits of those alternatives, including clinical trials.  4. Patients treated with bamlanivimab should continue to  self-isolate and use infection control measures (e.g., wear mask, isolate, social distance, avoid sharing personal items, clean and disinfect "high touch" surfaces, and frequent handwashing) according to CDC guidelines.   5. The patient or parent/caregiver has the option to accept or refuse bamlanivimab.  After reviewing this information with the patient, The patient agreed to proceed with receiving the infusion of bamlanivimab and will be provided a copy of the Fact sheet prior to receiving the infusion.  [name] 08/31/2019 6:41 PM

## 2019-08-31 NOTE — Progress Notes (Signed)
Virtual Visit via Telephone Note  I connected with Don [name] on 08/31/19 at  3:30 PM EST by telephone and verified that I am speaking with the correct person using two identifiers.  Location: Patient: Home  Provider: Office    I discussed the limitations, risks, security and privacy concerns of performing an evaluation and management service by telephone and the availability of in person appointments. I also discussed with the patient that there may be a patient responsible charge related to this service. The patient expressed understanding and agreed to proceed.   History of Present Illness: 53 year old male morbidly obese seen for pulmonary consult August 04, 2019 to establish for severe persistent asthma asthma and obstructive sleep apnea  Medical history significant for hypertension and morbid obesity (BMI 47)   Today's televisit is for an acute office visit.  Patient has recently been diagnosed with COVID-19.  Patient has recently established with Dr. Elsworth Soho for pulmonary and sleep medicine.  He has severe persistent asthma.  And obstructive sleep apnea.  Patient was seen on August 04, 2019.  At that time records from his home sleep study were requested and he was set up for a CPAP titration study.  Patient was previously started on CPAP but was intolerant.  Patient was continued on Breo.  And he is on Dupixent.  He was restarted back onto Singulair.  Wife was exposed to [place] at work last week. Tested positive on 08/25/19. He went for testing and tested positive at [facility], has copy of test results. He was positive .  Developed chills, back pain, fever tmax 101, nausea last 11/28 . Continues to have ongoing fever. MInimally productive cough . Mild wheezing . Has noticed some mild dyspnea with activity . No n/v.d. eating okay . No chest pain, calf pain , hemoptysis. Started on Prednisone taper at urgent care. Has couple of days left. He is self quarantining at home .  T  Observations/Objective: 05/2019 spirometry-ratio 87, FEV1 1.97-54%, FVC 2.2 648% 09/2018 spirometry-ratio 80, FEV1 86%, FVC 84% 09/2018 eos 0  Home sleep study April 2019 showed severe sleep apnea with REI 73/hour severe desaturations with minimum O2 saturation at 58%.   Assessment and Plan: COVID-19 viral infection-patient education on supportive care.  Discussed red flags to be on the outlook for such as worsening shortness of breath, fever, nausea vomiting, inability to eat or keep food and fluids down.  Have encouraged him to obtain a pulse oximeter and monitor oxygen levels and to report his oxygen levels trend down below 94% on room air. We will send patient information to our Covid team for consideration of monoclonal antibody in outpatient infusion-patient aware-patient is high risk due to his severe asthma, morbid obesity with high BMI, severe obstructive sleep apnea with nocturnal hypoxemia (currently untreated)  Severe persistent asthma-appears stable at this time.  Patient does have mild flare with Covid 19 virus.  Continue on current regimen of Breo and use albuterol as needed.  If symptoms worsen or breathing becomes unresponsive to albuterol patient is to seek emergency care.  Severe obstructive sleep apnea CPAP titration study is pending.  Previously CPAP intolerant Follow-up for CPAP titration when able  Plan  Patient Instructions  Push fluids. Rest and activity as tolerated Mucinex DM twice daily as needed for cough and congestion. Finish steroid taper as recommended Continue on Breo 1 puff daily, rinse after use Albuterol inhaler or nebulizer as needed for wheezing. Monitor symptoms closely if develop fever, inability to  keep food or fluids down.,  Shortness of breath, chest pain, confusion contact our office or seek emergency care immediately Follow-up in 1 week and as needed for video visit Please contact office for sooner follow up if symptoms do not improve or  worsen or seek emergency care       Follow Up Instructions: Follow-up in 1 week for a video visit  Please contact office for sooner follow up if symptoms do not improve or worsen or seek emergency care     I discussed the assessment and treatment plan with the patient. The patient was provided an opportunity to ask questions and all were answered. The patient agreed with the plan and demonstrated an understanding of the instructions.   The patient was advised to call back or seek an in-person evaluation if the symptoms worsen or if the condition fails to improve as anticipated.  I provided 27  minutes of non-face-to-face time during this encounter.   Rubye Oaks, NP

## 2019-08-31 NOTE — Telephone Encounter (Signed)
Discussed with patient about Covid symptoms and the use of bamlanivimab, a monoclonal antibody infusion for those with mild to moderate Covid symptoms and at a high risk of hospitalization.  Pt is qualified for this infusion at the [facility] infusion center due to age >42 or  co-morbid conditions including BMI>35 addressed and are actively being managed by a [facility] provider.    After discussing the infusion's costs, potential benefits and side effects, the patient has decided to accept treatment with monoclonal antibodies.    Patient Active Problem List   Diagnosis Date Noted  . OSA (obstructive sleep apnea) 08/04/2019  . Achilles tendinitis of right lower extremity 07/28/2019  . Asthma 10/20/2018  . Leukocytosis 10/09/2016  . Essential hypertension 09/25/2016  . Major depression 09/25/2016  . Morbid obesity ([employer]) 09/25/2016  . Pulmonary nodule 09/25/2016  . Herniated lumbar disc without myelopathy 04/23/2015

## 2019-09-01 ENCOUNTER — Inpatient Hospital Stay (HOSPITAL_COMMUNITY)
Admission: EM | Admit: 2019-09-01 | Discharge: 2019-09-10 | DRG: 177 | Disposition: A | Discharge status: Home Health | Payer: 59 | Attending: Internal Medicine | Admitting: Internal Medicine

## 2019-09-01 ENCOUNTER — Emergency Department (HOSPITAL_COMMUNITY): Payer: 59

## 2019-09-01 ENCOUNTER — Telehealth: Payer: Self-pay | Admitting: Pulmonary Disease

## 2019-09-01 ENCOUNTER — Encounter (HOSPITAL_COMMUNITY): Payer: Self-pay | Admitting: Emergency Medicine

## 2019-09-01 DIAGNOSIS — J9600 Acute respiratory failure, unspecified whether with hypoxia or hypercapnia: Secondary | ICD-10-CM

## 2019-09-01 DIAGNOSIS — J45909 Unspecified asthma, uncomplicated: Secondary | ICD-10-CM | POA: Diagnosis present

## 2019-09-01 DIAGNOSIS — Z823 Family history of stroke: Secondary | ICD-10-CM | POA: Diagnosis not present

## 2019-09-01 DIAGNOSIS — Z9981 Dependence on supplemental oxygen: Secondary | ICD-10-CM

## 2019-09-01 DIAGNOSIS — Z87442 Personal history of urinary calculi: Secondary | ICD-10-CM

## 2019-09-01 DIAGNOSIS — Z8249 Family history of ischemic heart disease and other diseases of the circulatory system: Secondary | ICD-10-CM

## 2019-09-01 DIAGNOSIS — Z833 Family history of diabetes mellitus: Secondary | ICD-10-CM | POA: Diagnosis not present

## 2019-09-01 DIAGNOSIS — J9601 Acute respiratory failure with hypoxia: Secondary | ICD-10-CM | POA: Diagnosis present

## 2019-09-01 DIAGNOSIS — Z888 Allergy status to other drugs, medicaments and biological substances status: Secondary | ICD-10-CM | POA: Diagnosis not present

## 2019-09-01 DIAGNOSIS — Z79899 Other long term (current) drug therapy: Secondary | ICD-10-CM | POA: Diagnosis not present

## 2019-09-01 DIAGNOSIS — I10 Essential (primary) hypertension: Secondary | ICD-10-CM | POA: Diagnosis present

## 2019-09-01 DIAGNOSIS — K219 Gastro-esophageal reflux disease without esophagitis: Secondary | ICD-10-CM | POA: Diagnosis present

## 2019-09-01 DIAGNOSIS — Z981 Arthrodesis status: Secondary | ICD-10-CM | POA: Diagnosis not present

## 2019-09-01 DIAGNOSIS — G4733 Obstructive sleep apnea (adult) (pediatric): Secondary | ICD-10-CM | POA: Diagnosis present

## 2019-09-01 DIAGNOSIS — U071 COVID-19: Principal | ICD-10-CM | POA: Diagnosis present

## 2019-09-01 DIAGNOSIS — Z791 Long term (current) use of non-steroidal anti-inflammatories (NSAID): Secondary | ICD-10-CM | POA: Diagnosis not present

## 2019-09-01 DIAGNOSIS — L309 Dermatitis, unspecified: Secondary | ICD-10-CM | POA: Diagnosis present

## 2019-09-01 DIAGNOSIS — Z6841 Body Mass Index (BMI) 40.0 and over, adult: Secondary | ICD-10-CM | POA: Diagnosis not present

## 2019-09-01 DIAGNOSIS — R0602 Shortness of breath: Secondary | ICD-10-CM | POA: Diagnosis present

## 2019-09-01 DIAGNOSIS — Z87891 Personal history of nicotine dependence: Secondary | ICD-10-CM | POA: Diagnosis not present

## 2019-09-01 DIAGNOSIS — Z803 Family history of malignant neoplasm of breast: Secondary | ICD-10-CM | POA: Diagnosis not present

## 2019-09-01 DIAGNOSIS — J1289 Other viral pneumonia: Secondary | ICD-10-CM | POA: Diagnosis present

## 2019-09-01 DIAGNOSIS — F329 Major depressive disorder, single episode, unspecified: Secondary | ICD-10-CM | POA: Diagnosis present

## 2019-09-01 DIAGNOSIS — Z9089 Acquired absence of other organs: Secondary | ICD-10-CM | POA: Diagnosis not present

## 2019-09-01 DIAGNOSIS — J1282 Pneumonia due to coronavirus disease 2019: Secondary | ICD-10-CM | POA: Diagnosis present

## 2019-09-01 LAB — COMPREHENSIVE METABOLIC PANEL
ALT: 51 U/L — ABNORMAL HIGH (ref 0–44)
AST: 42 U/L — ABNORMAL HIGH (ref 15–41)
Albumin: 2.8 g/dL — ABNORMAL LOW (ref 3.5–5.0)
Alkaline Phosphatase: 82 U/L (ref 38–126)
Anion gap: 11 (ref 5–15)
BUN: 16 mg/dL (ref 6–20)
CO2: 23 mmol/L (ref 22–32)
Calcium: 8.2 mg/dL — ABNORMAL LOW (ref 8.9–10.3)
Chloride: 101 mmol/L (ref 98–111)
Creatinine, Ser: 1.07 mg/dL (ref 0.61–1.24)
GFR calc Af Amer: >60 mL/min (ref >60)
GFR calc non Af Amer: >60 mL/min (ref >60)
Glucose, Bld: 125 mg/dL — ABNORMAL HIGH (ref 70–99)
Potassium: 4.3 mmol/L (ref 3.5–5.1)
Sodium: 135 mmol/L (ref 135–145)
Total Bilirubin: 0.6 mg/dL (ref 0.3–1.2)
Total Protein: 6.3 g/dL — ABNORMAL LOW (ref 6.5–8.1)

## 2019-09-01 LAB — FIBRINOGEN: Fibrinogen: 671 mg/dL — ABNORMAL HIGH (ref 210–475)

## 2019-09-01 LAB — D-DIMER, QUANTITATIVE: D-Dimer, Quant: 1.11 ug/mL-FEU — ABNORMAL HIGH (ref 0.00–0.50)

## 2019-09-01 LAB — LACTIC ACID, PLASMA
Lactic Acid, Venous: 2 mmol/L (ref 0.5–1.9)
Lactic Acid, Venous: 1.4 mmol/L (ref 0.5–1.9)

## 2019-09-01 LAB — CBC WITH DIFFERENTIAL/PLATELET
Abs Immature Granulocytes: 0.08 10*3/uL — ABNORMAL HIGH (ref 0.00–0.07)
Basophils Absolute: 0 10*3/uL (ref 0.0–0.1)
Basophils Relative: 0 %
Eosinophils Absolute: 0 10*3/uL (ref 0.0–0.5)
Eosinophils Relative: 0 %
HCT: 49.8 % (ref 39.0–52.0)
Hemoglobin: 15.8 g/dL (ref 13.0–17.0)
Immature Granulocytes: 1 %
Lymphocytes Relative: 7 %
Lymphs Abs: 0.7 10*3/uL (ref 0.7–4.0)
MCH: 26.7 pg (ref 26.0–34.0)
MCHC: 31.7 g/dL (ref 30.0–36.0)
MCV: 84.1 fL (ref 80.0–100.0)
Monocytes Absolute: 0.4 10*3/uL (ref 0.1–1.0)
Monocytes Relative: 5 %
Neutro Abs: 8.1 10*3/uL — ABNORMAL HIGH (ref 1.7–7.7)
Neutrophils Relative %: 87 %
Platelets: 233 10*3/uL (ref 150–400)
RBC: 5.92 MIL/uL — ABNORMAL HIGH (ref 4.22–5.81)
RDW: 14.9 % (ref 11.5–15.5)
WBC: 9.3 10*3/uL (ref 4.0–10.5)
nRBC: 0 % (ref 0.0–0.2)

## 2019-09-01 LAB — POC SARS CORONAVIRUS 2 AG -  ED: SARS Coronavirus 2 Ag: POSITIVE — AB

## 2019-09-01 LAB — FERRITIN: Ferritin: 553 ng/mL — ABNORMAL HIGH (ref 24–336)

## 2019-09-01 LAB — PROCALCITONIN: Procalcitonin: 0.12 ng/mL

## 2019-09-01 LAB — TRIGLYCERIDES: Triglycerides: 64 mg/dL (ref <150)

## 2019-09-01 LAB — C-REACTIVE PROTEIN: CRP: 11.1 mg/dL — ABNORMAL HIGH (ref <1.0)

## 2019-09-01 LAB — LACTATE DEHYDROGENASE: LDH: 411 U/L — ABNORMAL HIGH (ref 98–192)

## 2019-09-01 MED ORDER — AMLODIPINE BESYLATE 10 MG PO TABS
10.0000 mg | ORAL_TABLET | Freq: Every day | ORAL | Status: DC
  Administered 2019-09-02 – 2019-09-10 (×9): 10 mg via ORAL
  Filled 2019-09-01 (×10): qty 1

## 2019-09-01 MED ORDER — SODIUM CHLORIDE 0.9 % IV SOLN
100.0000 mg | INTRAVENOUS | Status: AC
  Administered 2019-09-02 – 2019-09-05 (×4): 100 mg via INTRAVENOUS
  Filled 2019-09-01 (×6): qty 20

## 2019-09-01 MED ORDER — SODIUM CHLORIDE 0.9 % IV SOLN
200.0000 mg | Freq: Once | INTRAVENOUS | Status: AC
  Administered 2019-09-01: 200 mg via INTRAVENOUS
  Filled 2019-09-01: qty 40

## 2019-09-01 MED ORDER — ALBUTEROL SULFATE HFA 108 (90 BASE) MCG/ACT IN AERS
2.0000 | INHALATION_SPRAY | RESPIRATORY_TRACT | Status: DC | PRN
  Administered 2019-09-05 – 2019-09-06 (×2): 2 via RESPIRATORY_TRACT
  Filled 2019-09-01: qty 6.7

## 2019-09-01 MED ORDER — FAMOTIDINE IN NACL 20-0.9 MG/50ML-% IV SOLN
20.0000 mg | Freq: Two times a day (BID) | INTRAVENOUS | Status: DC
  Administered 2019-09-01: 20:00:00 20 mg via INTRAVENOUS
  Filled 2019-09-01 (×2): qty 50

## 2019-09-01 MED ORDER — VITAMIN C 500 MG PO TABS
500.0000 mg | ORAL_TABLET | Freq: Two times a day (BID) | ORAL | Status: DC
  Administered 2019-09-02 – 2019-09-10 (×18): 500 mg via ORAL
  Filled 2019-09-01 (×17): qty 1

## 2019-09-01 MED ORDER — FUROSEMIDE 20 MG PO TABS
20.0000 mg | ORAL_TABLET | Freq: Every day | ORAL | Status: DC
  Administered 2019-09-02: 20 mg via ORAL
  Filled 2019-09-01: qty 1

## 2019-09-01 MED ORDER — ENOXAPARIN SODIUM 80 MG/0.8ML ~~LOC~~ SOLN
80.0000 mg | SUBCUTANEOUS | Status: DC
  Administered 2019-09-02 – 2019-09-09 (×8): 80 mg via SUBCUTANEOUS
  Filled 2019-09-01 (×9): qty 0.8

## 2019-09-01 MED ORDER — DEXAMETHASONE SODIUM PHOSPHATE 10 MG/ML IJ SOLN
10.0000 mg | Freq: Once | INTRAMUSCULAR | Status: AC
  Administered 2019-09-01: 10 mg via INTRAVENOUS
  Filled 2019-09-01: qty 1

## 2019-09-01 MED ORDER — DEXAMETHASONE SODIUM PHOSPHATE 10 MG/ML IJ SOLN: 6.0000 mg | Freq: Every day | INTRAMUSCULAR | Status: DC

## 2019-09-01 NOTE — Telephone Encounter (Signed)
Called and spoke with pt's wife letting her know pt needs to be evaluated in ED due to low O2 sats, pain in chest, and having covid. She verbalized understanding. Nothing further needed.

## 2019-09-01 NOTE — ED Notes (Signed)
Called [facility] for transport to [facility]

## 2019-09-01 NOTE — ED Notes (Signed)
[facility] called/Transfer to [facility]) @ 1854-per Lennette Bihari, Writer called by Levada Dy

## 2019-09-01 NOTE — ED Notes (Signed)
Attempted to call nursing report to [facility]

## 2019-09-01 NOTE — ED Triage Notes (Signed)
Pt reports diagnosed COVID + Saturday. Has had fevers to 101.6 at home. Reports worsening SOB the last 2 days. States only hx asthma and HTN.

## 2019-09-01 NOTE — H&P (Signed)
NAME:  Don [name], MRN:  [id number], DOB:  08-Dec-1965, LOS: 0 ADMISSION DATE:  09/01/2019, CONSULTATION DATE:  09/01/19 REFERRING MD:  ED physician, CHIEF COMPLAINT:  covid pneumonia   Brief History   53 yo male with covid pna positive on Saturday began feeling ill with fevers that evening but with sob yesterday and presented to ED today.   History of present illness   54 yo male with pmh htn, asthma presented to ed with sob. Pt states that his wife tested positive for covid on last Friday, due to this pt who was feeling in his normal state of health went for testing. Pt states that evening he began to have fevers up to 101 but otherwise was feeling ok. Sunday began with some sob that has not improved with him inhalers or nebs. He was started on steroids the day of diagnosis as outpt and was set up for "infusion" on Monday. He denies other sick contacts. He has had no other symptoms, cp/n/v/d. No sputum production   Past Medical History   Past Medical History:  Diagnosis Date  . Asthma   . Claustrophobia   . Depression   . Hypertension   . Kidney stone    YRS AGO   PASSED ON [facility] Events   12/4 admit  Consults:    Procedures:    Significant Diagnostic Tests:    Micro Data:   12/4: sars2: positive 12/4 blood: pending Antimicrobials:  remdesivir 12/4->  Interim history/subjective:    Objective   Blood pressure (!) 139/96, pulse (!) 103, temperature 100.1 F (37.8 C), temperature source Oral, resp. rate (!) 38, SpO2 90 %.       No intake or output data in the 24 hours ending 09/01/19 1719 There were no vitals filed for this visit.  Examination: General: obese male in prone positioning, mildly tachpneic but speaking in full sentences at this time.  HEENT: NCAT, EOMI, PERRLA, MMMP Lungs: CTA but diminished, no wheezes, rhonchi, rales Cardiovascular: RRR, no m/g/r Abdomen: obese, soft, NT,ND, BS+ Extremities: no c/c/e Skin: no rashes,  warm and dry Neuro: moves all 4 extremities  Resolved Hospital Problem list     Assessment & Plan:  covid 19 pneumonia:  -admit to [facility] -titrate oxygen -encouraged and educated pt on self proning.  -remdesivir -decadron -cont vitamin C -trend labs.  Acute hypoxic resp failure 2/2 above:  -titrate oxygen as able -currently on NRB with sats around 90 when proned.  -mildly tachypneic but able to speak in full sentences.  Asthma:  -albuterol  HTN:  -home meds for now.    Best practice:  Diet: NPO Pain/Anxiety/Delirium protocol (if indicated): not indicated VAP protocol (if indicated): not indicated DVT prophylaxis: enoxaparin GI prophylaxis: famotidine Glucose control: monitor Mobility: bedrest Code Status: FULL Family Communication: with pt Disposition: [facility] transfer  Labs   CBC: Recent Labs  Lab 09/01/19 1422  WBC 9.3  NEUTROABS 8.1*  HGB 15.8  HCT 49.8  MCV 84.1  PLT 638    Basic Metabolic Panel: Recent Labs  Lab 09/01/19 1422  NA 135  K 4.3  CL 101  CO2 23  GLUCOSE 125*  BUN 16  CREATININE 1.07  CALCIUM 8.2*   GFR: CrCl cannot be calculated (Unknown ideal weight.). Recent Labs  Lab 09/01/19 1422 09/01/19 1450  WBC 9.3  --   LATICACIDVEN  --  2.0*    Liver Function Tests: Recent Labs  Lab 09/01/19 1422  AST  42*  ALT 51*  ALKPHOS 82  BILITOT 0.6  PROT 6.3*  ALBUMIN 2.8*   No results for input(s): LIPASE, AMYLASE in the last 168 hours. No results for input(s): AMMONIA in the last 168 hours.  ABG No results found for: PHART, PCO2ART, PO2ART, HCO3, TCO2, ACIDBASEDEF, O2SAT   Coagulation Profile: No results for input(s): INR, PROTIME in the last 168 hours.  Cardiac Enzymes: No results for input(s): CKTOTAL, CKMB, CKMBINDEX, TROPONINI in the last 168 hours.  HbA1C: No results found for: HGBA1C  CBG: No results for input(s): GLUCAP in the last 168 hours.  Review of Systems:   Sob, cough, fevers.. see hpi for full ros  Past  Medical History  He,  has a past medical history of Asthma, Claustrophobia, Depression, Hypertension, and Kidney stone.   Surgical History    Past Surgical History:  Procedure Laterality Date  . ANKLE SURGERY     RIGHT  . LASIK    . LUMBAR LAMINECTOMY/DECOMPRESSION MICRODISCECTOMY Right 02/28/2013   Procedure: LUMBAR LAMINECTOMY/DECOMPRESSION MICRODISCECTOMY 1 LEVEL;  Surgeon: Maeola HarmanJoseph Stern, MD;  Location: [facility];  Service: Neurosurgery;  Laterality: Right;  Right Lumbar Four-Five Laminectomy  . MAXIMUM ACCESS (MAS)POSTERIOR LUMBAR INTERBODY FUSION (PLIF) 1 LEVEL N/A 04/23/2015   Procedure: L4-5 Maximum access posterior lumbar interbody fusion;  Surgeon: Maeola HarmanJoseph Stern, MD;  Location: [facility];  Service: Neurosurgery;  Laterality: N/A;  L4-5 Maximum access posterior lumbar interbody fusion  . RADIOLOGY WITH ANESTHESIA N/A 02/05/2015   Procedure: MRI - Lumbar Spine;  Surgeon: Medication Radiologist, MD;  Location: [facility] OR;  Service: Radiology;  Laterality: N/A;  . TONSILLECTOMY     AS CHILD       Social History   reports that he quit smoking about 28 years ago. His smoking use included cigarettes. He has a 6.00 pack-year smoking history. He has never used smokeless tobacco. He reports current alcohol use. He reports that he does not use drugs.   Family History   His family history includes Breast cancer in his mother; Diabetes in his mother; Hypertension in his father and mother; Stroke in his father. There is no history of Allergic rhinitis, Angioedema, Asthma, Eczema, Immunodeficiency, Urticaria, or Atopy.   Allergies Allergies  Allergen Reactions  . Lisinopril Other (See Comments)    Muscle aches and fatigue.     Home Medications  Prior to Admission medications   Medication Sig Start Date End Date Taking? Authorizing Provider  albuterol (PROVENTIL HFA;VENTOLIN HFA) 108 (90 Base) MCG/ACT inhaler Inhale 2 puffs into the lungs every 6 (six) hours as needed for shortness of breath.  10/08/17  Yes Padgett, Pilar GrammesShaylar Patricia, MD  amLODipine (NORVASC) 10 MG tablet Take 10 mg by mouth daily. 11/29/15  Yes [provider]  Ascorbic Acid (VITAMIN C PO) Take 1 tablet by mouth daily.   Yes [provider]  BREO ELLIPTA 200-25 MCG/INH AEPB INHALE 1 PUFF INTO THE LUNGS DAILY. RINSE, GARGLE, AND SPIT AFTER USE. Patient taking differently: Inhale 1 puff into the lungs daily. Rinse, gargle, and spit after use. 08/14/19  Yes Padgett, Pilar GrammesShaylar Patricia, MD  DUPIXENT 300 MG/2ML SOSY INJECT 600MG  (2 SYRINGES)  SUBCUTANEOUSLY ON DAY 1,  300MG  (1 SYRINGE) DAY 15,  THEN EVERY OTHER WEEK  THEREAFTER Patient taking differently: Inject 300 mg into the skin every 14 (fourteen) days. Thursdays 02/13/19  Yes Alfonse SpruceGallagher, Joel Louis, MD  furosemide (LASIX) 20 MG tablet Take 20 mg by mouth daily.  07/07/19  Yes [provider]  ipratropium-albuterol (DUONEB) 0.5-2.5 (3) MG/3ML SOLN TAKE 3 MLS BY NEBULIZATION EVERY 6 (SIX) HOURS AS NEEDED. Patient taking differently: Take 3 mLs by nebulization every 6 (six) hours as needed (shortness of breath).  02/13/19  Yes Padgett, Pilar Grammes, MD  Multiple Vitamins-Minerals (ZINC PO) Take 1 tablet by mouth daily.   Yes [provider]  pimecrolimus (ELIDEL) 1 % cream Apply topically 2 (two) times daily. Patient taking differently: Apply 1 application topically as needed.  06/23/19  Yes Padgett, Pilar Grammes, MD  sertraline (ZOLOFT) 50 MG tablet Take 50 mg by mouth daily.  05/29/19  Yes [provider]  valsartan (DIOVAN) 320 MG tablet Take 320 mg by mouth daily.    Yes [provider]  meloxicam (MOBIC) 15 MG tablet Take 1 tablet (15 mg total) by mouth daily. Patient not taking: Reported on 08/31/2019 08/09/19   Helane Gunther, DPM  Olopatadine HCl (PAZEO) 0.7 % SOLN Place 1 drop into both eyes daily as needed. Patient not taking: Reported on 08/31/2019 06/23/19   Marcelyn Bruins, MD     Critical care time:  The patient is critically ill with multiple organ systems failure and requires high complexity decision making for assessment and support, frequent evaluation and titration of therapies, application of advanced monitoring technologies and extensive interpretation of multiple databases.  Critical care time 51 mins. This represents my time independent of the NP's/PA's/med students/residents time taking care of the pt. This is excluding procedures.     Briant Sites DO [facility] 09/01/2019, 5:19 PM

## 2019-09-01 NOTE — ED Provider Notes (Signed)
[facility] EMERGENCY DEPARTMENT Provider Note   CSN: [id number] Arrival date & time: 09/01/19  1415     History   Chief Complaint Chief Complaint  Patient presents with  . Shortness of Breath    HPI Don [name] is a 53 y.o. male presenting for evaluation of shortness of breath.  Patient states he tested positive for Covid 6 days ago.  He started to get extremely short of breath yesterday, this gradually worsened over the past 24 hours.  He reports fevers up to 101 at home.  History of asthma, has been using his albuterol inhaler and nebulizer without improvement.  Was started on a steroid pack, today was day 5 of 5.  His wife tested positive for Covid first, no other sick contacts.  He has a history of asthma and hypertension, no other medical problems.  He does not wear oxygen.  He denies chest pain, nausea, vomiting, domino pain, urinary symptoms, normal bowel movements. Quit smoking 30 yrs ago.      HPI  Past Medical History:  Diagnosis Date  . Asthma   . Claustrophobia   . Depression   . Hypertension   . Kidney stone    YRS AGO   PASSED ON OWN     Patient Active Problem List   Diagnosis Date Noted  . COVID-19 virus infection 08/31/2019  . OSA (obstructive sleep apnea) 08/04/2019  . Achilles tendinitis of right lower extremity 07/28/2019  . Asthma 10/20/2018  . Leukocytosis 10/09/2016  . Essential hypertension 09/25/2016  . Major depression 09/25/2016  . Morbid obesity ([place]) 09/25/2016  . Pulmonary nodule 09/25/2016  . Herniated lumbar disc without myelopathy 04/23/2015    Past Surgical History:  Procedure Laterality Date  . ANKLE SURGERY     RIGHT  . LASIK    . LUMBAR LAMINECTOMY/DECOMPRESSION MICRODISCECTOMY Right 02/28/2013   Procedure: LUMBAR LAMINECTOMY/DECOMPRESSION MICRODISCECTOMY 1 LEVEL;  Surgeon: Erline Levine, MD;  Location: [facility];  Service: Neurosurgery;  Laterality: Right;  Right Lumbar Four-Five Laminectomy  . MAXIMUM  ACCESS (MAS)POSTERIOR LUMBAR INTERBODY FUSION (PLIF) 1 LEVEL N/A 04/23/2015   Procedure: L4-5 Maximum access posterior lumbar interbody fusion;  Surgeon: Erline Levine, MD;  Location: [facility];  Service: Neurosurgery;  Laterality: N/A;  L4-5 Maximum access posterior lumbar interbody fusion  . RADIOLOGY WITH ANESTHESIA N/A 02/05/2015   Procedure: MRI - Lumbar Spine;  Surgeon: Medication Radiologist, MD;  Location: [facility];  Service: Radiology;  Laterality: N/A;  . TONSILLECTOMY     AS CHILD          Home Medications    Prior to Admission medications   Medication Sig Start Date End Date Taking? Authorizing Provider  albuterol (PROVENTIL HFA;VENTOLIN HFA) 108 (90 Base) MCG/ACT inhaler Inhale 2 puffs into the lungs every 6 (six) hours as needed for shortness of breath. 10/08/17   Kennith Gain, MD  amLODipine (NORVASC) 10 MG tablet Take 10 mg by mouth daily. 11/29/15   [provider]  Bismuth Tribromoph-Petrolatum (XEROFORM PETROLAT PATCH 4"X4") PADS Apply topically. 05/30/19   [provider]  BREO ELLIPTA 200-25 MCG/INH AEPB INHALE 1 PUFF INTO THE LUNGS DAILY. RINSE, GARGLE, AND SPIT AFTER USE. 08/14/19   Kennith Gain, MD  [employer] SALINE WOUND [place] 0.9 % SOLN See admin instructions. 04/19/19   [provider]  diclofenac (VOLTAREN) 50 MG EC tablet Take 50 mg by mouth 2 (two) times daily.    [provider]  [place] 300 MG/2ML Emelia Loron   (2 SYRINGES)  SUBCUTANEOUSLY ON DAY 1,   (1 SYRINGE) DAY 15,  THEN EVERY OTHER WEEK  THEREAFTER 02/13/19   Alfonse Spruce, MD  Elastic Bandages & Supports ([employer] ELASTIC BANDAGE 4") MISC See admin instructions. 04/19/19   [provider]  furosemide (LASIX) 20 MG tablet  07/07/19   [provider]  Gauze Bandages ([employer] ROLLED GAUZE) MISC See admin instructions. 04/19/19   [provider]  Gauze Pads & Dressings ([employer] GAUZE PAD STERILE 4"X4") 4"X4" PADS See admin  instructions. 04/19/19   [provider]  Gauze Pads & Dressings (GAUZE BANDAGE) MISC Apply topically. 05/30/19   [provider]  ipratropium-albuterol (DUONEB) 0.5-2.5 (3) MG/3ML SOLN TAKE 3 MLS BY NEBULIZATION EVERY 6 (SIX) HOURS AS NEEDED. 02/13/19   Marcelyn Bruins, MD  meloxicam (MOBIC) 15 MG tablet Take 1 tablet (15 mg total) by mouth daily. Patient not taking: Reported on 08/31/2019 08/09/19   Helane Gunther, DPM  Olopatadine HCl (PAZEO) 0.7 % SOLN Place 1 drop into both eyes daily as needed. Patient not taking: Reported on 08/31/2019 06/23/19   Marcelyn Bruins, MD  pimecrolimus (ELIDEL) 1 % cream Apply topically 2 (two) times daily. 06/23/19   Marcelyn Bruins, MD  sertraline (ZOLOFT) 50 MG tablet  05/29/19   [provider]  valsartan (DIOVAN) 320 MG tablet Take 320 mg by mouth daily.     [provider]    Family History Family History  Problem Relation Age of Onset  . Hypertension Mother   . Breast cancer Mother   . Diabetes Mother   . Hypertension Father   . Stroke Father   . Allergic rhinitis Neg Hx   . Angioedema Neg Hx   . Asthma Neg Hx   . Eczema Neg Hx   . Immunodeficiency Neg Hx   . Urticaria Neg Hx   . Atopy Neg Hx     Social History Social History   Tobacco Use  . Smoking status: Former Smoker    Packs/day: 1.00    Years: 6.00    Pack years: 6.00    Types: Cigarettes    Quit date: 09/28/1990    Years since quitting: 28.9  . Smokeless tobacco: Never Used  Substance Use Topics  . Alcohol use: Yes    Comment: SOCIAL   . Drug use: No     Allergies   Lisinopril   Review of Systems Review of Systems  Constitutional: Positive for fever.  Respiratory: Positive for shortness of breath.   All other systems reviewed and are negative.    Physical Exam Updated Vital Signs BP (!) 146/89   Pulse (!) 108   Temp 100.1 F (37.8 C) (Oral)   Resp (!) 38   SpO2 95%   Physical Exam Vitals signs  and nursing note reviewed.  Constitutional:      Appearance: He is well-developed. He is ill-appearing, toxic-appearing and diaphoretic.     Comments: Obese male who appears uncomfortable, diaphoretic.  HENT:     Head: Normocephalic and atraumatic.  Eyes:     Conjunctiva/sclera: Conjunctivae normal.     Pupils: Pupils are equal, round, and reactive to light.  Neck:     Musculoskeletal: Normal range of motion and neck supple.  Cardiovascular:     Rate and Rhythm: Normal rate and regular rhythm.  Pulmonary:     Effort: Tachypnea and respiratory distress present.     Breath sounds: No wheezing.     Comments: Body  habitus limiting exam.  No obvious wheezing.  Accessory muscle use.  Tachypnea.  Hypoxic on room air Abdominal:     General: Bowel sounds are normal. There is no distension.     Palpations: Abdomen is soft.     Tenderness: There is no abdominal tenderness.  Musculoskeletal: Normal range of motion.  Skin:    General: Skin is warm.     Capillary Refill: Capillary refill takes less than 2 seconds.  Neurological:     Mental Status: He is alert and oriented to person, place, and time.      ED Treatments / Results  Labs (all labs ordered are listed, but only abnormal results are displayed) Labs Reviewed  CBC WITH DIFFERENTIAL/PLATELET - Abnormal; Notable for the following components:      Result Value   RBC 5.92 (*)    Neutro Abs 8.1 (*)    Abs Immature Granulocytes 0.08 (*)    All other components within normal limits  CULTURE, BLOOD (ROUTINE X 2)  CULTURE, BLOOD (ROUTINE X 2)  LACTIC ACID, PLASMA  LACTIC ACID, PLASMA  COMPREHENSIVE METABOLIC PANEL  D-DIMER, QUANTITATIVE (NOT AT [facility])  PROCALCITONIN  LACTATE DEHYDROGENASE  FERRITIN  FIBRINOGEN  C-REACTIVE PROTEIN  TRIGLYCERIDES  POC SARS CORONAVIRUS 2 AG -  ED  I-STAT ARTERIAL BLOOD GAS, ED    EKG None  Radiology Dg Chest Port 1 View  Result Date: 09/01/2019 CLINICAL DATA:  Shortness of breath, COVID-19  positive EXAM: PORTABLE CHEST 1 VIEW COMPARISON:  01/18/2019 FINDINGS: The heart size and mediastinal contours are within normal limits. Multifocal airspace consolidations within a predominantly peripheral distribution. No large pleural fluid collection. No pneumothorax. The visualized skeletal structures are unremarkable. IMPRESSION: Extensive multifocal airspace opacities in a predominantly peripheral distribution suggestive of multifocal viral pneumonia. Electronically Signed   By: Duanne Guess M.D.   On: 09/01/2019 15:05    Procedures .Critical Care Performed by: Alveria Apley, PA-C Authorized by: Alveria Apley, PA-C   Critical care provider statement:    Critical care time (minutes):  40   Critical care time was exclusive of:  Separately billable procedures and treating other patients and teaching time   Critical care was necessary to treat or prevent imminent or life-threatening deterioration of the following conditions:  Respiratory failure   Critical care was time spent personally by me on the following activities:  Blood draw for specimens, development of treatment plan with patient or surrogate, discussions with consultants, examination of patient, obtaining history from patient or surrogate, evaluation of patient's response to treatment, ordering and performing treatments and interventions, ordering and review of laboratory studies, ordering and review of radiographic studies, pulse oximetry, re-evaluation of patient's condition and review of old charts   I assumed direction of critical care for this patient from another provider in my specialty: no   Comments:     Pt with respiratory distress. Concern for covid ARDS.    (including critical care time)  Medications Ordered in ED Medications  dexamethasone (DECADRON) injection 10 mg (10 mg Intravenous Given 09/01/19 1515)     Initial Impression / Assessment and Plan / ED Course  I have reviewed the triage vital signs and  the nursing notes.  Pertinent labs & imaging results that were available during my care of the patient were reviewed by me and considered in my medical decision making (see chart for details).        Patient presenting for evaluation of shortness of breath.  Physical exam concerning, patient is  in respiratory distress.  Tachypneic and hypoxic.  Ill-appearing and diaphoretic.  Sats on room air in the 60s.  On 6 L via nasal cannula, patient is still at 70%.  As such, patient placed on greater than 15 L via nonrebreather, sats gradually improved to 88 to 90%.  Feeling improved on nonrebreather, intermittent episodes of worsening shortness of breath.  With any movement or speaking, patient becomes tachypneic and short of breath and sats drop to upper 80's.  Case discussed with attending, Dr. Lockie Molauratolo evaluated the patient.  Will obtain EKG, Covid labs, x-ray, ABG.  Decadron given.  Will consult with critical care, as I have a high suspicion that patient may decompensate.  X-ray shows extensive infiltrate consistent with Covid/viral pneumonia.  Discussed findings and plan with patient.  Patient remains mentating well and is reasonably comfortable on nonrebreather.  [facility] Pt to be admitted.   Final Clinical Impressions(s) / ED Diagnoses   Final diagnoses:  COVID-19  Acute respiratory failure, unspecified whether with hypoxia or hypercapnia [facility](HCC)    ED Discharge Orders    None       Alveria ApleyCaccavale, Clark Cuff, PA-C 09/01/19 1530    Rancour, Jeannett SeniorStephen, MD 09/02/19 [phone]

## 2019-09-01 NOTE — ED Notes (Signed)
[place], MD called, spoke with her about getting pt upgraded from medsurg to tele.

## 2019-09-02 ENCOUNTER — Inpatient Hospital Stay (HOSPITAL_COMMUNITY): Payer: 59

## 2019-09-02 LAB — COMPREHENSIVE METABOLIC PANEL
ALT: 49 U/L — ABNORMAL HIGH (ref 0–44)
AST: 34 U/L (ref 15–41)
Albumin: 3 g/dL — ABNORMAL LOW (ref 3.5–5.0)
Alkaline Phosphatase: 76 U/L (ref 38–126)
Anion gap: 14 (ref 5–15)
BUN: 29 mg/dL — ABNORMAL HIGH (ref 6–20)
CO2: 23 mmol/L (ref 22–32)
Calcium: 8.4 mg/dL — ABNORMAL LOW (ref 8.9–10.3)
Chloride: 102 mmol/L (ref 98–111)
Creatinine, Ser: 1.14 mg/dL (ref 0.61–1.24)
GFR calc Af Amer: >60 mL/min (ref >60)
GFR calc non Af Amer: >60 mL/min (ref >60)
Glucose, Bld: 130 mg/dL — ABNORMAL HIGH (ref 70–99)
Potassium: 4.4 mmol/L (ref 3.5–5.1)
Sodium: 139 mmol/L (ref 135–145)
Total Bilirubin: 0.7 mg/dL (ref 0.3–1.2)
Total Protein: 6.7 g/dL (ref 6.5–8.1)

## 2019-09-02 LAB — ABO/RH: ABO/RH(D): B NEG

## 2019-09-02 LAB — TYPE AND SCREEN
ABO/RH(D): B NEG
Antibody Screen: NEGATIVE

## 2019-09-02 LAB — C-REACTIVE PROTEIN
CRP: 13.4 mg/dL — ABNORMAL HIGH (ref <1.0)
CRP: 12.3 mg/dL — ABNORMAL HIGH (ref <1.0)

## 2019-09-02 LAB — LACTATE DEHYDROGENASE
LDH: 426 U/L — ABNORMAL HIGH (ref 98–192)
LDH: 410 U/L — ABNORMAL HIGH (ref 98–192)

## 2019-09-02 LAB — D-DIMER, QUANTITATIVE
D-Dimer, Quant: 0.99 ug/mL-FEU — ABNORMAL HIGH (ref 0.00–0.50)
D-Dimer, Quant: 0.91 ug/mL-FEU — ABNORMAL HIGH (ref 0.00–0.50)

## 2019-09-02 LAB — MAGNESIUM: Magnesium: 2.1 mg/dL (ref 1.7–2.4)

## 2019-09-02 LAB — GLUCOSE, CAPILLARY
Glucose-Capillary: 125 mg/dL — ABNORMAL HIGH (ref 70–99)
Glucose-Capillary: 115 mg/dL — ABNORMAL HIGH (ref 70–99)
Glucose-Capillary: 174 mg/dL — ABNORMAL HIGH (ref 70–99)

## 2019-09-02 LAB — HEMOGLOBIN A1C
Hgb A1c MFr Bld: 6.6 % — ABNORMAL HIGH (ref 4.8–5.6)
Mean Plasma Glucose: 142.72 mg/dL

## 2019-09-02 LAB — BRAIN NATRIURETIC PEPTIDE: B Natriuretic Peptide: 36.9 pg/mL (ref 0.0–100.0)

## 2019-09-02 LAB — FIBRINOGEN: Fibrinogen: 774 mg/dL — ABNORMAL HIGH (ref 210–475)

## 2019-09-02 LAB — FERRITIN: Ferritin: 691 ng/mL — ABNORMAL HIGH (ref 24–336)

## 2019-09-02 LAB — HIV ANTIBODY (ROUTINE TESTING W REFLEX): HIV Screen 4th Generation wRfx: NONREACTIVE

## 2019-09-02 LAB — PROCALCITONIN: Procalcitonin: 0.12 ng/mL

## 2019-09-02 MED ORDER — SODIUM CHLORIDE 0.9 % IV SOLN: 700.0000 mg | Freq: Once | INTRAVENOUS | Status: DC

## 2019-09-02 MED ORDER — METHYLPREDNISOLONE SODIUM SUCC 125 MG IJ SOLR
80.0000 mg | Freq: Two times a day (BID) | INTRAMUSCULAR | Status: DC
  Administered 2019-09-02: 80 mg via INTRAVENOUS
  Filled 2019-09-02: qty 2

## 2019-09-02 MED ORDER — METHYLPREDNISOLONE SODIUM SUCC 125 MG IJ SOLR: 125.0000 mg | Freq: Once | INTRAMUSCULAR | Status: DC | PRN

## 2019-09-02 MED ORDER — INSULIN ASPART 100 UNIT/ML ~~LOC~~ SOLN
0.0000 [IU] | SUBCUTANEOUS | Status: DC
  Administered 2019-09-02 – 2019-09-03 (×4): 2 [IU] via SUBCUTANEOUS
  Administered 2019-09-03: 17:00:00 3 [IU] via SUBCUTANEOUS
  Administered 2019-09-03: 01:00:00 2 [IU] via SUBCUTANEOUS
  Administered 2019-09-03: 3 [IU] via SUBCUTANEOUS
  Administered 2019-09-04: 2 [IU] via SUBCUTANEOUS
  Administered 2019-09-04 (×3): 3 [IU] via SUBCUTANEOUS
  Administered 2019-09-04: 2 [IU] via SUBCUTANEOUS
  Administered 2019-09-05 (×2): 3 [IU] via SUBCUTANEOUS
  Administered 2019-09-05: 06:00:00 2 [IU] via SUBCUTANEOUS
  Administered 2019-09-05: 3 [IU] via SUBCUTANEOUS
  Administered 2019-09-05 – 2019-09-06 (×2): 2 [IU] via SUBCUTANEOUS
  Administered 2019-09-06 (×3): 3 [IU] via SUBCUTANEOUS
  Administered 2019-09-06: 2 [IU] via SUBCUTANEOUS
  Administered 2019-09-06: 3 [IU] via SUBCUTANEOUS
  Administered 2019-09-07: 5 [IU] via SUBCUTANEOUS
  Administered 2019-09-07 – 2019-09-08 (×4): 2 [IU] via SUBCUTANEOUS

## 2019-09-02 MED ORDER — METHYLPREDNISOLONE SODIUM SUCC 125 MG IJ SOLR
60.0000 mg | Freq: Two times a day (BID) | INTRAMUSCULAR | Status: DC
  Administered 2019-09-02 – 2019-09-05 (×7): 60 mg via INTRAVENOUS
  Filled 2019-09-02 (×8): qty 2

## 2019-09-02 MED ORDER — TOCILIZUMAB 400 MG/20ML IV SOLN
800.0000 mg | Freq: Once | INTRAVENOUS | Status: AC
  Administered 2019-09-02: 10:00:00 800 mg via INTRAVENOUS
  Filled 2019-09-02: qty 40

## 2019-09-02 MED ORDER — DIPHENHYDRAMINE HCL 50 MG/ML IJ SOLN: 50.0000 mg | Freq: Once | INTRAMUSCULAR | Status: DC | PRN

## 2019-09-02 MED ORDER — SODIUM CHLORIDE 0.9 % IV SOLN: INTRAVENOUS | Status: DC | PRN

## 2019-09-02 MED ORDER — EPINEPHRINE 0.3 MG/0.3ML IJ SOAJ: 0.3000 mg | Freq: Once | INTRAMUSCULAR | Status: DC | PRN

## 2019-09-02 MED ORDER — SERTRALINE HCL 50 MG PO TABS
50.0000 mg | ORAL_TABLET | Freq: Every day | ORAL | Status: DC
  Administered 2019-09-02 – 2019-09-10 (×9): 50 mg via ORAL
  Filled 2019-09-02 (×9): qty 1

## 2019-09-02 MED ORDER — FAMOTIDINE IN NACL 20-0.9 MG/50ML-% IV SOLN: 20.0000 mg | Freq: Once | INTRAVENOUS | Status: DC | PRN

## 2019-09-02 MED ORDER — FAMOTIDINE 20 MG PO TABS
20.0000 mg | ORAL_TABLET | Freq: Every day | ORAL | Status: DC
  Administered 2019-09-02 – 2019-09-10 (×9): 20 mg via ORAL
  Filled 2019-09-02 (×9): qty 1

## 2019-09-02 MED ORDER — FUROSEMIDE 10 MG/ML IJ SOLN
40.0000 mg | Freq: Once | INTRAMUSCULAR | Status: AC
  Administered 2019-09-02: 10:00:00 40 mg via INTRAVENOUS
  Filled 2019-09-02: qty 4

## 2019-09-02 MED ORDER — ALBUTEROL SULFATE HFA 108 (90 BASE) MCG/ACT IN AERS: 2.0000 | INHALATION_SPRAY | Freq: Once | RESPIRATORY_TRACT | Status: DC | PRN

## 2019-09-02 NOTE — ED Notes (Signed)
[facility] called. 

## 2019-09-02 NOTE — Progress Notes (Signed)
PROGRESS NOTE                                                                                                                                                                                                             Patient Demographics:    Don [name], is a 53 y.o. male, DOB - Mar 16, 1966, JOI:[id number]  Outpatient Primary MD for the patient is [name], Dibas, MD    LOS - 1  Admit date - 09/01/2019    Chief Complaint  Patient presents with  . Shortness of Breath       Brief Narrative 53 yo male with medical history of morbid obesity, asthma on IL-3 blocker shots twice a month, essential hypertension who was diagnosed with COVID-19 infection about 8 days ago finally presented with severe shortness of breath ongoing for 3 to 4 days.  Was diagnosed with severe acute hypoxic respiratory failure and admitted by ICU team on 09/01/2019, he was started on Decadron and remdesivir and transferred to my care on 09/02/2019.  He has arrived to the hospital and intense respiratory distress on 15 L high flow nasal cannula oxygen.   Subjective:    Don [name] today has, No headache, No chest pain, No abdominal pain - No Nausea, No new weakness tingling or numbness, +++  SOB.     Assessment  & Plan :     1. Acute Hypoxic Resp. Failure due to Acute Covid 19 Viral Pneumonitis during the ongoing 2020 Covid 19 Pandemic - he has severe disease and presented after 4 days of symptoms, he was in the ER on day 1 under ICU team.  Has received Decadron and remdesivir 1 dose.  He has severe hypoxic respiratory failure currently on 15 L of high flow nasal cannula oxygen and still quite short of breath, he has high probability of not doing well clinically.  I am ordering Actemra on him right away, due to his body weight and intensity of his disease might have to repeat a dose tomorrow, he has already consented for it.  Will be monitored closely.  We will  also give a dose of IV Lasix.  Encouraged him to sit up in chair in the daytime use I-S and flutter valve for pulmonary toiletry and then prone in bed when at night.  Actemra off label use - patient was told  that if COVID-19 pneumonitis gets worse we might potentially use Actemra off label, he denies any known history of tuberculosis or hepatitis, understands the risks and benefits and wants to proceed with Actemra treatment if required.  Does take a IL-3 blocker for his eczema.  We will proceed with Actemra ASAP.  Convalescent plasma  - Patient was also informed in detail about convalescent plasma use, she has consented for the same.  Will be given if needed.  SpO2: 93 % O2 Flow Rate (L/min): 15 L/min  Recent Labs  Lab 09/01/19 1422 09/02/19 0530 09/02/19 1015  CRP 11.1* 13.4*  --   DDIMER 1.11* 0.99* 0.91*  FERRITIN 553* 691*  --   PROCALCITON 0.12  --   --     Hepatic Function Latest Ref Rng & Units 09/01/2019 10/09/2016  Total Protein 6.5 - 8.1 g/dL 6.3(L) 7.0  Albumin 3.5 - 5.0 g/dL 2.8(L) 3.5  AST 15 - 41 U/L 42(H) 21  ALT 0 - 44 U/L 51(H) 35  Alk Phosphatase 38 - 126 U/L 82 102  Total Bilirubin 0.3 - 1.2 mg/dL 0.6 0.56     2.  Morbid obesity.  BMI of greater than 40.  Follow with PCP for weight loss..   3.  Asthma.  Supportive care no wheezing.  4.  Essential hypertension.  Asked will monitor and adjust.  5.  GERD.  On Pepcid.   6.  Eczema.  Takes IL-3 blocker at home.  Resume once discharged.    Condition - Extremely Guarded  Family Communication  : Called wife on her listed cell phone on 09/02/2019 at 11:18 AM.  Mailbox full.  Code Status :  Full  Diet :   Diet Order            DIET SOFT Room service appropriate? Yes; Fluid consistency: Thin  Diet effective now               Disposition Plan  :  PCU  Consults  :  None  Procedures  :    PUD Prophylaxis : Pepcid  DVT Prophylaxis  :  Lovenox    Lab Results  Component Value Date   PLT 233  09/01/2019    Inpatient Medications  Scheduled Meds: . amLODipine  10 mg Oral Daily  . enoxaparin (LOVENOX) injection  80 mg Subcutaneous Q24H  . furosemide  20 mg Oral Daily  . insulin aspart  0-15 Units Subcutaneous Q4H  . methylPREDNISolone (SOLU-MEDROL) injection  60 mg Intravenous Q12H  . sertraline  50 mg Oral Daily  . vitamin C  500 mg Oral BID   Continuous Infusions: . famotidine (PEPCID) IV Stopped (09/01/19 2022)  . remdesivir 100 mg in NS 100 mL 100 mg (09/02/19 0951)   PRN Meds:.albuterol  Antibiotics  :    Anti-infectives (From admission, onward)   Start     Dose/Rate Route Frequency Ordered Stop   09/02/19 1000  remdesivir 100 mg in sodium chloride 0.9 % 100 mL IVPB     100 mg 200 mL/hr over 30 Minutes Intravenous Every 24 hours 09/01/19 1626 09/06/19 0959   09/01/19 1700  remdesivir 200 mg in sodium chloride 0.9% 250 mL IVPB     200 mg 580 mL/hr over 30 Minutes Intravenous Once 09/01/19 1626 09/01/19 1800       Time Spent in minutes  30   Lala Lund M.D on 09/02/2019 at 11:17 AM  To page go to [email or web address] - password [email or web address]  [facility] -  Office  [phone]   See all Orders from today for further details    Objective:   Vitals:   09/02/19 0530 09/02/19 0623 09/02/19 0625 09/02/19 0738  BP: 136/86   126/72  Pulse: 89   76  Resp: 19   (!) 25  Temp:  (!) 97.1 F (36.2 C)    TempSrc:  Axillary Axillary   SpO2: (!) 88%   93%    Wt Readings from Last 3 Encounters:  08/04/19 (!) 161.3 kg  06/23/19 (!) 159.2 kg  04/03/19 (!) 158.8 kg     Intake/Output Summary (Last 24 hours) at 09/02/2019 1117 Last data filed at 09/01/2019 2022 Gross per 24 hour  Intake 50 ml  Output -  Net 50 ml     Physical Exam  Awake Alert, obese guy in respiratory distress despite being on 15 L of oxygen, [place].AT,PERRAL Supple Neck,No JVD, No cervical lymphadenopathy appriciated.  Symmetrical Chest wall movement, Good air movement bilaterally, few  Rales RRR,No Gallops,Rubs or new Murmurs, No Parasternal Heave +ve B.Sounds, Abd Soft, No tenderness, No organomegaly appriciated, No rebound - guarding or rigidity. No Cyanosis, Clubbing or edema, No new Rash or bruise      Data Review:    CBC Recent Labs  Lab 09/01/19 1422  WBC 9.3  HGB 15.8  HCT 49.8  PLT 233  MCV 84.1  MCH 26.7  MCHC 31.7  RDW 14.9  LYMPHSABS 0.7  MONOABS 0.4  EOSABS 0.0  BASOSABS 0.0    Chemistries  Recent Labs  Lab 09/01/19 1422  NA 135  K 4.3  CL 101  CO2 23  GLUCOSE 125*  BUN 16  CREATININE 1.07  CALCIUM 8.2*  AST 42*  ALT 51*  ALKPHOS 82  BILITOT 0.6   ------------------------------------------------------------------------------------------------------------------ Recent Labs    09/01/19 1422  TRIG 64    No results found for: HGBA1C ------------------------------------------------------------------------------------------------------------------ No results for input(s): TSH, T4TOTAL, T3FREE, THYROIDAB in the last 72 hours.  Invalid input(s): FREET3  Cardiac Enzymes No results for input(s): CKMB, TROPONINI, MYOGLOBIN in the last 168 hours.  Invalid input(s): CK ------------------------------------------------------------------------------------------------------------------    Component Value Date/Time   BNP 18.7 09/22/2016 1614    Micro Results No results found for this or any previous visit (from the past 240 hour(s)).  Radiology Reports Dg Chest Port 1 View  Result Date: 09/01/2019 CLINICAL DATA:  Shortness of breath, COVID-19 positive EXAM: PORTABLE CHEST 1 VIEW COMPARISON:  01/18/2019 FINDINGS: The heart size and mediastinal contours are within normal limits. Multifocal airspace consolidations within a predominantly peripheral distribution. No large pleural fluid collection. No pneumothorax. The visualized skeletal structures are unremarkable. IMPRESSION: Extensive multifocal airspace opacities in a predominantly  peripheral distribution suggestive of multifocal viral pneumonia. Electronically Signed   By: Davina Poke M.D.   On: 09/01/2019 15:05

## 2019-09-02 NOTE — ED Notes (Signed)
Patient spouse asking for a call back [place] [phone]

## 2019-09-03 ENCOUNTER — Other Ambulatory Visit: Payer: Self-pay

## 2019-09-03 LAB — CBC WITH DIFFERENTIAL/PLATELET
Abs Immature Granulocytes: 0.14 10*3/uL — ABNORMAL HIGH (ref 0.00–0.07)
Basophils Absolute: 0 10*3/uL (ref 0.0–0.1)
Basophils Relative: 1 %
Eosinophils Absolute: 0 10*3/uL (ref 0.0–0.5)
Eosinophils Relative: 0 %
HCT: 50.1 % (ref 39.0–52.0)
Hemoglobin: 15.6 g/dL (ref 13.0–17.0)
Immature Granulocytes: 2 %
Lymphocytes Relative: 12 %
Lymphs Abs: 0.9 10*3/uL (ref 0.7–4.0)
MCH: 26.5 pg (ref 26.0–34.0)
MCHC: 31.1 g/dL (ref 30.0–36.0)
MCV: 85.1 fL (ref 80.0–100.0)
Monocytes Absolute: 0.3 10*3/uL (ref 0.1–1.0)
Monocytes Relative: 4 %
Neutro Abs: 6.4 10*3/uL (ref 1.7–7.7)
Neutrophils Relative %: 81 %
Platelets: 261 10*3/uL (ref 150–400)
RBC: 5.89 MIL/uL — ABNORMAL HIGH (ref 4.22–5.81)
RDW: 15.8 % — ABNORMAL HIGH (ref 11.5–15.5)
WBC: 7.9 10*3/uL (ref 4.0–10.5)
nRBC: 0 % (ref 0.0–0.2)

## 2019-09-03 LAB — COMPREHENSIVE METABOLIC PANEL
ALT: 45 U/L — ABNORMAL HIGH (ref 0–44)
AST: 33 U/L (ref 15–41)
Albumin: 2.9 g/dL — ABNORMAL LOW (ref 3.5–5.0)
Alkaline Phosphatase: 76 U/L (ref 38–126)
Anion gap: 13 (ref 5–15)
BUN: 43 mg/dL — ABNORMAL HIGH (ref 6–20)
CO2: 28 mmol/L (ref 22–32)
Calcium: 8.5 mg/dL — ABNORMAL LOW (ref 8.9–10.3)
Chloride: 98 mmol/L (ref 98–111)
Creatinine, Ser: 1.21 mg/dL (ref 0.61–1.24)
GFR calc Af Amer: >60 mL/min (ref >60)
GFR calc non Af Amer: >60 mL/min (ref >60)
Glucose, Bld: 134 mg/dL — ABNORMAL HIGH (ref 70–99)
Potassium: 4.6 mmol/L (ref 3.5–5.1)
Sodium: 139 mmol/L (ref 135–145)
Total Bilirubin: 0.4 mg/dL (ref 0.3–1.2)
Total Protein: 6.6 g/dL (ref 6.5–8.1)

## 2019-09-03 LAB — GLUCOSE, CAPILLARY
Glucose-Capillary: 135 mg/dL — ABNORMAL HIGH (ref 70–99)
Glucose-Capillary: 118 mg/dL — ABNORMAL HIGH (ref 70–99)
Glucose-Capillary: 140 mg/dL — ABNORMAL HIGH (ref 70–99)
Glucose-Capillary: 156 mg/dL — ABNORMAL HIGH (ref 70–99)
Glucose-Capillary: 168 mg/dL — ABNORMAL HIGH (ref 70–99)

## 2019-09-03 LAB — MAGNESIUM: Magnesium: 2.5 mg/dL — ABNORMAL HIGH (ref 1.7–2.4)

## 2019-09-03 LAB — PROCALCITONIN: Procalcitonin: <0.1 ng/mL

## 2019-09-03 LAB — D-DIMER, QUANTITATIVE: D-Dimer, Quant: 0.84 ug/mL-FEU — ABNORMAL HIGH (ref 0.00–0.50)

## 2019-09-03 LAB — BRAIN NATRIURETIC PEPTIDE: B Natriuretic Peptide: 28.5 pg/mL (ref 0.0–100.0)

## 2019-09-03 LAB — C-REACTIVE PROTEIN: CRP: 9.7 mg/dL — ABNORMAL HIGH (ref <1.0)

## 2019-09-03 MED ORDER — TOCILIZUMAB 400 MG/20ML IV SOLN
800.0000 mg | Freq: Once | INTRAVENOUS | Status: AC
  Administered 2019-09-03: 800 mg via INTRAVENOUS
  Filled 2019-09-03: qty 40

## 2019-09-03 MED ORDER — PNEUMOCOCCAL VAC POLYVALENT 25 MCG/0.5ML IJ INJ
0.5000 mL | INJECTION | INTRAMUSCULAR | Status: DC
  Filled 2019-09-03: qty 0.5

## 2019-09-03 MED ORDER — INFLUENZA VAC SPLIT QUAD 0.5 ML IM SUSY
0.5000 mL | PREFILLED_SYRINGE | INTRAMUSCULAR | Status: DC
  Filled 2019-09-03: qty 0.5

## 2019-09-03 NOTE — Progress Notes (Signed)
PROGRESS NOTE                                                                                                                                                                                                             Patient Demographics:    Don [name], is a 53 y.o. male, DOB - 13-Apr-1966, YWV:[id number]  Outpatient Primary MD for the patient is Don [name], Dibas, MD    LOS - 2  Admit date - 09/01/2019    Chief Complaint  Patient presents with  . Shortness of Breath       Brief Narrative  53 yo male with medical history of morbid obesity, asthma on IL-3 blocker shots twice a month, essential hypertension who was diagnosed with COVID-19 infection about 8 days ago finally presented with severe shortness of breath ongoing for 3 to 4 days.  Was diagnosed with severe acute hypoxic respiratory failure and admitted by ICU team on 09/01/2019, he was started on Decadron and remdesivir and transferred to my care on 09/02/2019.  He has arrived to the hospital and intense respiratory distress on 15 L high flow nasal cannula oxygen.   Subjective:    [name] today has, No headache, No chest pain, No abdominal pain - No Nausea, No new weakness tingling or numbness, no Cough still ++ SOB.     Assessment  & Plan :   1. Acute Hypoxic Resp. Failure due to Acute Covid 19 Viral Pneumonitis during the ongoing 2020 Covid 19 Pandemic - he has severe disease and presented after 4 days of symptoms, he was in the ER on day 1 under ICU team.  He had received Decadron and remdesivir 1 dose under the ICU team.  He was transferred to my care on 09/02/2019, he immediately received a dose of Actemra after appropriate consent, clinically he is improved on 09/03/2019 says he feels about 30 to 40% better, due to his extreme obesity unguinal repeat and Actemra dose as I hope that he will continue to improve.  Encouraged him to sit in chair in the daytime use  flutter valve and I-S for pulmonary toiletry and prone in bed at night.  We will continue to monitor closely.     SpO2: 92 % O2 Flow Rate (L/min): 15 L/min  Recent Labs  Lab 09/01/19 1422 09/02/19 0530 09/02/19 1015 09/03/19 8546  CRP 11.1* 13.4* 12.3* 9.7*  DDIMER 1.11* 0.99* 0.91* 0.84*  FERRITIN 553* 691*  --   --   BNP  --   --  36.9 28.5  PROCALCITON 0.12  --  0.12 <0.10    Hepatic Function Latest Ref Rng & Units 09/03/2019 09/02/2019 09/01/2019  Total Protein 6.5 - 8.1 g/dL 6.6 6.7 6.3(L)  Albumin 3.5 - 5.0 g/dL 2.9(L) 3.0(L) 2.8(L)  AST 15 - 41 U/L 33 34 42(H)  ALT 0 - 44 U/L 45(H) 49(H) 51(H)  Alk Phosphatase 38 - 126 U/L 76 76 82  Total Bilirubin 0.3 - 1.2 mg/dL 0.4 0.7 0.6     2.  Morbid obesity.  BMI of greater than 40.  Follow with PCP for weight loss..   3.  Asthma.  Supportive care no wheezing.  4.  Essential hypertension.  Asked will monitor and adjust.  5.  GERD.  On Pepcid.   6.  Eczema.  Takes IL-3 blocker at home.  Resume once discharged.     Condition - Extremely Guarded  Family Communication  :  Wife on 12/6  Code Status : Full  Diet :    Diet Order            DIET SOFT Room service appropriate? Yes; Fluid consistency: Thin  Diet effective now               Disposition Plan  :  PCU  Consults  :    Procedures  :    PUD Prophylaxis : Pepcid  DVT Prophylaxis  :  Lovenox    Lab Results  Component Value Date   PLT 261 09/03/2019    Inpatient Medications  Scheduled Meds: . amLODipine  10 mg Oral Daily  . enoxaparin (LOVENOX) injection  80 mg Subcutaneous Q24H  . famotidine  20 mg Oral Daily  . insulin aspart  0-15 Units Subcutaneous Q4H  . methylPREDNISolone (SOLU-MEDROL) injection  60 mg Intravenous Q12H  . sertraline  50 mg Oral Daily  . vitamin C  500 mg Oral BID   Continuous Infusions: . remdesivir 100 mg in NS 100 mL 100 mg (09/03/19 0920)  . tocilizumab (ACTEMRA) IV     PRN Meds:.albuterol  Antibiotics  :     Anti-infectives (From admission, onward)   Start     Dose/Rate Route Frequency Ordered Stop   09/02/19 1000  remdesivir 100 mg in sodium chloride 0.9 % 100 mL IVPB     100 mg 200 mL/hr over 30 Minutes Intravenous Every 24 hours 09/01/19 1626 09/06/19 0959   09/01/19 1700  remdesivir 200 mg in sodium chloride 0.9% 250 mL IVPB     200 mg 580 mL/hr over 30 Minutes Intravenous Once 09/01/19 1626 09/02/19 1400       Time Spent in minutes  30   Lala Lund M.D on 09/03/2019 at 9:37 AM  To page go to [email or web address] - password [facility]  [facility] -  Office  [phone]    See all Orders from today for further details    Objective:   Vitals:   09/02/19 1301 09/02/19 1629 09/02/19 2000 09/03/19 0421  BP: 118/75 137/76 126/85 (!) 148/78  Pulse: 97 93 65 82  Resp: 20 (!) 24    Temp: 98 F (36.7 C) 98.3 F (36.8 C) 98.2 F (36.8 C) 98 F (36.7 C)  TempSrc: Oral Oral Oral Oral  SpO2: 94% 91% (!) 88% 92%    Wt Readings from Last 3 Encounters:  08/04/19 (!) 161.3 kg  06/23/19 (!) 159.2 kg  04/03/19 (!) 158.8 kg     Intake/Output Summary (Last 24 hours) at 09/03/2019 0937 Last data filed at 09/03/2019 0500 Gross per 24 hour  Intake 580 ml  Output 800 ml  Net -220 ml     Physical Exam  Awake Alert,   No new F.N deficits, Normal affect [place].AT,PERRAL Supple Neck,No JVD, No cervical lymphadenopathy appriciated.  Symmetrical Chest wall movement, Good air movement bilaterally, CTAB RRR,No Gallops,Rubs or new Murmurs, No Parasternal Heave +ve B.Sounds, Abd Soft, No tenderness, No organomegaly appriciated, No rebound - guarding or rigidity. No Cyanosis, Clubbing or edema, No new Rash or bruise      Data Review:    CBC Recent Labs  Lab 09/01/19 1422 09/03/19 0212  WBC 9.3 7.9  HGB 15.8 15.6  HCT 49.8 50.1  PLT 233 261  MCV 84.1 85.1  MCH 26.7 26.5  MCHC 31.7 31.1  RDW 14.9 15.8*  LYMPHSABS 0.7 0.9  MONOABS 0.4 0.3  EOSABS 0.0 0.0  BASOSABS 0.0 0.0     Chemistries  Recent Labs  Lab 09/01/19 1422 09/02/19 1015 09/03/19 0212  NA 135 139 139  K 4.3 4.4 4.6  CL 101 102 98  CO2 '23 23 28  '$ GLUCOSE 125* 130* 134*  BUN 16 29* 43*  CREATININE 1.07 1.14 1.21  CALCIUM 8.2* 8.4* 8.5*  MG  --  2.1 2.5*  AST 42* 34 33  ALT 51* 49* 45*  ALKPHOS 82 76 76  BILITOT 0.6 0.7 0.4   ------------------------------------------------------------------------------------------------------------------ Recent Labs    09/01/19 1422  TRIG 64    Lab Results  Component Value Date   HGBA1C 6.6 (H) 09/02/2019   ------------------------------------------------------------------------------------------------------------------ No results for input(s): TSH, T4TOTAL, T3FREE, THYROIDAB in the last 72 hours.  Invalid input(s): FREET3  Cardiac Enzymes No results for input(s): CKMB, TROPONINI, MYOGLOBIN in the last 168 hours.  Invalid input(s): CK ------------------------------------------------------------------------------------------------------------------    Component Value Date/Time   BNP 28.5 09/03/2019 0212   BNP 18.7 09/22/2016 1614    Micro Results Recent Results (from the past 240 hour(s))  Blood Culture (routine x 2)     Status: None (Preliminary result)   Collection Time: 09/01/19  2:22 PM   Specimen: BLOOD  Result Value Ref Range Status   Specimen Description BLOOD LEFT ANTECUBITAL  Final   Special Requests   Final    BOTTLES DRAWN AEROBIC AND ANAEROBIC Blood Culture adequate volume   Culture   Final    NO GROWTH 2 DAYS Performed at [facility], [address] [place], [place] [address]    Report Status PENDING  Incomplete  Blood Culture (routine x 2)     Status: None (Preliminary result)   Collection Time: 09/01/19 11:30 PM   Specimen: BLOOD  Result Value Ref Range Status   Specimen Description BLOOD RIGHT ARM  Final   Special Requests   Final    BOTTLES DRAWN AEROBIC AND ANAEROBIC Blood Culture adequate volume    Culture   Final    NO GROWTH 1 DAY Performed at [facility], [place] [address], [place], [place] [address]    Report Status PENDING  Incomplete    Radiology Reports Dg Chest Port 1 View  Result Date: 09/02/2019 CLINICAL DATA:  COVID-19 patient with fever. EXAM: PORTABLE CHEST 1 VIEW COMPARISON:  September 01, 2019 FINDINGS: Bilateral pulmonary infiltrates remain, similar given difference in technique in the interval. No other interval changes.  IMPRESSION: Significant bilateral pulmonary infiltrates remain. Given difference in technique/penetration, there has been no definitive interval change. The findings are consistent with the patient's COVID-19 positive status. Electronically Signed   By: Dorise Bullion III M.D   On: 09/02/2019 21:01   Dg Chest Port 1 View  Result Date: 09/01/2019 CLINICAL DATA:  Shortness of breath, COVID-19 positive EXAM: PORTABLE CHEST 1 VIEW COMPARISON:  01/18/2019 FINDINGS: The heart size and mediastinal contours are within normal limits. Multifocal airspace consolidations within a predominantly peripheral distribution. No large pleural fluid collection. No pneumothorax. The visualized skeletal structures are unremarkable. IMPRESSION: Extensive multifocal airspace opacities in a predominantly peripheral distribution suggestive of multifocal viral pneumonia. Electronically Signed   By: Davina Poke M.D.   On: 09/01/2019 15:05

## 2019-09-03 NOTE — Progress Notes (Signed)
Occupational Therapy Evaluation Patient Details Name: Don [name] No MRN: [id number] DOB: Aug 04, 1966 Today's Date: 09/03/2019    History of Present Illness 53 yo male with medical history of morbid obesity, asthma on IL-3 blocker shots twice a month, essential hypertension who was diagnosed with COVID-19 infection about 8 days ago finally presented with severe shortness of breath ongoing for 3 to 4 days.  Was diagnosed with severe acute hypoxic respiratory failure and admitted by ICU team on 09/01/2019, he was started on Decadron and remdesivir and transferred to my care on 09/02/2019.  He has arrived to the hospital and intense respiratory distress on 15 L high flow nasal cannula oxygen.   Clinical Impression   PTA pt was living with his wife, independent in ADLs, IADLs, and mobility. Pt still drives and works full time. Pt currently requires setup to supervision for all self-care and functional transfer tasks. Pt does not use oxygen at home and is currently on 15L HFNC. Pt's O2 SATs dropped to 85% after donning/doffing socks requiring mod seated rest break for O2 to increase to 96%. Pt able to ambulate to/from bathroom and complete toileting task. Pt's O2 SATs dropped to 81% on 15L HFNC following task. Again pt required mod seated rest break for O2 SATs to increase to 93%. 3/4 DOE. Pt reports anxiety related to SOB with education provided on relaxation strategies. Pt demonstrates decreased strength, endurance, balance, standing tolerance, and activity tolerance impacting ability to complete self-care and functional transfer tasks. Recommend skilled OT services to address above deficits in order to promote function and prevent further decline.    Follow Up Recommendations  No OT follow up    Equipment Recommendations  None recommended by OT    Recommendations for Other Services       Precautions / Restrictions Precautions Precautions: Fall Restrictions Weight Bearing Restrictions: No       Mobility Bed Mobility                  Transfers Overall transfer level: Needs assistance Equipment used: None Transfers: Sit to/from Stand;Stand Pivot Transfers Sit to Stand: Supervision Stand pivot transfers: Supervision       General transfer comment: Pt ambulated to/from bathroom with supervision and without use of an assistive device. Noted 0 instances of LOB.     Balance Overall balance assessment: Mild deficits observed, not formally tested                                         ADL either performed or assessed with clinical judgement   ADL Overall ADL's : Needs assistance/impaired Eating/Feeding: Independent;Sitting   Grooming: Supervision/safety;Standing   Upper Body Bathing: Supervision/ safety;Sitting   Lower Body Bathing: Supervison/ safety;Sit to/from stand;Sitting/lateral leans   Upper Body Dressing : Supervision/safety;Sitting   Lower Body Dressing: Supervision/safety;Sit to/from stand;Sitting/lateral leans   Toilet Transfer: Supervision/safety;Regular Toilet;Grab bars;Ambulation   Toileting- Clothing Manipulation and Hygiene: Supervision/safety;Sit to/from stand;Sitting/lateral lean       Functional mobility during ADLs: Supervision/safety       Vision Baseline Vision/History: No visual deficits       Perception     Praxis      Pertinent Vitals/Pain Pain Assessment: No/denies pain     Hand Dominance Right   Extremity/Trunk Assessment Upper Extremity Assessment Upper Extremity Assessment: Generalized weakness   Lower Extremity Assessment Lower Extremity Assessment: Defer to PT evaluation  Communication Communication Communication: No difficulties   Cognition Arousal/Alertness: Awake/alert Behavior During Therapy: WFL for tasks assessed/performed Overall Cognitive Status: Within Functional Limits for tasks assessed                                     General Comments  Educated pt  on safety strategies, activity modifications, and energy conservation techniques for ADLs, IADLs, and transfers. Educated pt on breathing exercises with good understanding and follow through.     Exercises Exercises: Other exercises Other Exercises Other Exercises: Incentive spirometer x 10 with min cues on technique. Pulling 1000mL Other Exercises: Flutter valve x 10 with min cues on technique. Other Exercises: Pursed lip breathing with mod cues on technique.   Shoulder Instructions      Home Living Family/patient expects to be discharged to:: Private residence Living Arrangements: Spouse/significant other Available Help at Discharge: Family Type of Home: House(Split level) Home Access: Stairs to enter Entrance Stairs-Number of Steps: 2   Home Layout: Two level;Able to live on main level with bedroom/bathroom     Bathroom Shower/Tub: Producer, television/film/videoWalk-in shower   Bathroom Toilet: Standard     Home Equipment: Shower seat          Prior Functioning/Environment Level of Independence: Independent        Comments: Independent in ADLs, IADLs, and mobility. Still drives and works full time. 0 falls in the last 6 months. Does not wear O2 at home.        OT Problem List: Decreased strength;Decreased activity tolerance;Impaired balance (sitting and/or standing);Cardiopulmonary status limiting activity      OT Treatment/Interventions: Self-care/ADL training;Therapeutic exercise;Neuromuscular education;Energy conservation;DME and/or AE instruction;Therapeutic activities;Patient/family education;Balance training    OT Goals(Current goals can be found in the care plan section) Acute Rehab OT Goals Patient Stated Goal: To be able to breath Time For Goal Achievement: 09/17/19 Potential to Achieve Goals: Good ADL Goals Pt Will Perform Grooming: Independently;standing Pt Will Perform Lower Body Bathing: Independently;sit to/from stand Pt Will Perform Lower Body Dressing: Independently;sit  to/from stand Pt Will Transfer to Toilet: Independently;regular height toilet;ambulating Pt Will Perform Toileting - Clothing Manipulation and hygiene: Independently;sit to/from stand Additional ADL Goal #1: Pt to recall and demonstrate breathing exercises with 0 verbal cues on technique.  OT Frequency: Min 3X/week   Barriers to D/C:            Co-evaluation              AM-PAC OT "6 Clicks" Daily Activity     Outcome Measure Help from another person eating meals?: None Help from another person taking care of personal grooming?: A Little Help from another person toileting, which includes using toliet, bedpan, or urinal?: A Little Help from another person bathing (including washing, rinsing, drying)?: A Little Help from another person to put on and taking off regular upper body clothing?: A Little Help from another person to put on and taking off regular lower body clothing?: A Little 6 Click Score: 19   End of Session Equipment Utilized During Treatment: Oxygen Nurse Communication: Mobility status  Activity Tolerance: Patient limited by fatigue(Limited by SOB and anxiety) Patient left: in chair;with call bell/phone within reach  OT Visit Diagnosis: Unsteadiness on feet (R26.81);Muscle weakness (generalized) (M62.81)                Time: 6045-40981427-1512 OT Time Calculation (min): 45 min Charges:  OT General Charges $OT Visit:  1 Visit OT Evaluation $OT Eval Moderate Complexity: 1 Mod OT Treatments $Self Care/Home Management : 8-22 mins $Therapeutic Exercise: 8-22 mins  Mauri Brooklyn OTR/L [phone]   Mauri Brooklyn 09/03/2019, 3:28 PM

## 2019-09-03 NOTE — Progress Notes (Signed)
Update family with care. Also notified MD that family wanted to talk to him.    09/03/19 1700  Family/Significant Other Communication  Family/Significant Other Update Called

## 2019-09-04 ENCOUNTER — Ambulatory Visit (HOSPITAL_COMMUNITY): Admission: RE | Admit: 2019-09-04 | Payer: 59 | Source: Ambulatory Visit

## 2019-09-04 LAB — CBC WITH DIFFERENTIAL/PLATELET
Abs Immature Granulocytes: 0.25 10*3/uL — ABNORMAL HIGH (ref 0.00–0.07)
Basophils Absolute: 0.1 10*3/uL (ref 0.0–0.1)
Basophils Relative: 1 %
Eosinophils Absolute: 0 10*3/uL (ref 0.0–0.5)
Eosinophils Relative: 0 %
HCT: 49.6 % (ref 39.0–52.0)
Hemoglobin: 15.5 g/dL (ref 13.0–17.0)
Immature Granulocytes: 2 %
Lymphocytes Relative: 12 %
Lymphs Abs: 1.3 10*3/uL (ref 0.7–4.0)
MCH: 26.1 pg (ref 26.0–34.0)
MCHC: 31.3 g/dL (ref 30.0–36.0)
MCV: 83.4 fL (ref 80.0–100.0)
Monocytes Absolute: 0.5 10*3/uL (ref 0.1–1.0)
Monocytes Relative: 4 %
Neutro Abs: 8.8 10*3/uL — ABNORMAL HIGH (ref 1.7–7.7)
Neutrophils Relative %: 81 %
Platelets: 284 10*3/uL (ref 150–400)
RBC: 5.95 MIL/uL — ABNORMAL HIGH (ref 4.22–5.81)
RDW: 15.4 % (ref 11.5–15.5)
WBC: 10.9 10*3/uL — ABNORMAL HIGH (ref 4.0–10.5)
nRBC: 0 % (ref 0.0–0.2)

## 2019-09-04 LAB — COMPREHENSIVE METABOLIC PANEL
ALT: 45 U/L — ABNORMAL HIGH (ref 0–44)
AST: 35 U/L (ref 15–41)
Albumin: 2.9 g/dL — ABNORMAL LOW (ref 3.5–5.0)
Alkaline Phosphatase: 77 U/L (ref 38–126)
Anion gap: 13 (ref 5–15)
BUN: 44 mg/dL — ABNORMAL HIGH (ref 6–20)
CO2: 23 mmol/L (ref 22–32)
Calcium: 8.4 mg/dL — ABNORMAL LOW (ref 8.9–10.3)
Chloride: 104 mmol/L (ref 98–111)
Creatinine, Ser: 1.16 mg/dL (ref 0.61–1.24)
GFR calc Af Amer: >60 mL/min (ref >60)
GFR calc non Af Amer: >60 mL/min (ref >60)
Glucose, Bld: 137 mg/dL — ABNORMAL HIGH (ref 70–99)
Potassium: 4.9 mmol/L (ref 3.5–5.1)
Sodium: 140 mmol/L (ref 135–145)
Total Bilirubin: 0.7 mg/dL (ref 0.3–1.2)
Total Protein: 6.3 g/dL — ABNORMAL LOW (ref 6.5–8.1)

## 2019-09-04 LAB — D-DIMER, QUANTITATIVE: D-Dimer, Quant: 0.56 ug/mL-FEU — ABNORMAL HIGH (ref 0.00–0.50)

## 2019-09-04 LAB — BRAIN NATRIURETIC PEPTIDE: B Natriuretic Peptide: 25.3 pg/mL (ref 0.0–100.0)

## 2019-09-04 LAB — GLUCOSE, CAPILLARY
Glucose-Capillary: 113 mg/dL — ABNORMAL HIGH (ref 70–99)
Glucose-Capillary: 125 mg/dL — ABNORMAL HIGH (ref 70–99)
Glucose-Capillary: 127 mg/dL — ABNORMAL HIGH (ref 70–99)
Glucose-Capillary: 150 mg/dL — ABNORMAL HIGH (ref 70–99)
Glucose-Capillary: 158 mg/dL — ABNORMAL HIGH (ref 70–99)
Glucose-Capillary: 156 mg/dL — ABNORMAL HIGH (ref 70–99)
Glucose-Capillary: 146 mg/dL — ABNORMAL HIGH (ref 70–99)
Glucose-Capillary: 152 mg/dL — ABNORMAL HIGH (ref 70–99)

## 2019-09-04 LAB — MAGNESIUM: Magnesium: 2.3 mg/dL (ref 1.7–2.4)

## 2019-09-04 LAB — C-REACTIVE PROTEIN: CRP: 4.2 mg/dL — ABNORMAL HIGH (ref <1.0)

## 2019-09-04 LAB — PROCALCITONIN: Procalcitonin: 0.1 ng/mL

## 2019-09-04 MED ORDER — HYDRALAZINE HCL 50 MG PO TABS
50.0000 mg | ORAL_TABLET | Freq: Three times a day (TID) | ORAL | Status: DC
  Administered 2019-09-04 – 2019-09-07 (×10): 50 mg via ORAL
  Filled 2019-09-04 (×10): qty 1

## 2019-09-04 MED ORDER — SALINE SPRAY 0.65 % NA SOLN
1.0000 | NASAL | Status: DC | PRN
  Administered 2019-09-04 – 2019-09-05 (×2): 1 via NASAL
  Filled 2019-09-04: qty 44

## 2019-09-04 NOTE — Progress Notes (Signed)
Oxygen titrated to 8L. Patient tolerating well with 02 sats staying 92-94%.  Will decrease flow rate as tolerated to maintain saturation levels at or above 92%.

## 2019-09-04 NOTE — Progress Notes (Signed)
PROGRESS NOTE                                                                                                                                                                                                             Patient Demographics:    Don [name], is a 53 y.o. male, DOB - 1966-02-10, BVQ:[id number]  Outpatient Primary MD for the patient is [name], [place], MD    LOS - 3  Admit date - 09/01/2019    Chief Complaint  Patient presents with   Shortness of Breath       Brief Narrative  53 yo male with medical history of morbid obesity, asthma on IL-3 blocker shots twice a month, essential hypertension who was diagnosed with COVID-19 infection about 8 days ago finally presented with severe shortness of breath ongoing for 3 to 4 days.  Was diagnosed with severe acute hypoxic respiratory failure and admitted by ICU team on 09/01/2019, he was started on Decadron and remdesivir and transferred to my care on 09/02/2019.  He has arrived to the hospital and intense respiratory distress on 15 L high flow nasal cannula oxygen.   Subjective:   Patient in bed, appears comfortable, denies any headache, no fever, no chest pain or pressure, improved shortness of breath , no abdominal pain. No focal weakness.    Assessment  & Plan :   1. Acute Hypoxic Resp. Failure due to Acute Covid 19 Viral Pneumonitis during the ongoing 2020 Covid 19 Pandemic - he has severe disease and presented after 4 days of symptoms, he was in the ER on day 1 under ICU team.  He had received Decadron and remdesivir 1 dose under the ICU team.  He was transferred to my care on 09/02/2019, he immediately received a dose of Actemra after appropriate consent, clinically he is improved on 09/03/2019 says he feels about 30 to 40% better, due to his extreme obesity unguinal repeat and Actemra dose as I hope that he will continue to improve.  Encouraged him to sit in chair in  the daytime use flutter valve and I-S for pulmonary toiletry and prone in bed at night.  We will continue to monitor closely.  He is now on 12 lit HF o2 at rest and 15 lit when he moves on 09/04/19.   SpO2: 90 % O2 Flow Rate (L/min):  15 L/min  Recent Labs  Lab 09/01/19 1422 09/02/19 0530 09/02/19 1015 09/03/19 0212 09/04/19 0253  CRP 11.1* 13.4* 12.3* 9.7* 4.2*  DDIMER 1.11* 0.99* 0.91* 0.84* 0.56*  FERRITIN 553* 691*  --   --   --   BNP  --   --  36.9 28.5 25.3  PROCALCITON 0.12  --  0.12 <0.10 0.10    Hepatic Function Latest Ref Rng & Units 09/04/2019 09/03/2019 09/02/2019  Total Protein 6.5 - 8.1 g/dL 6.3(L) 6.6 6.7  Albumin 3.5 - 5.0 g/dL 2.9(L) 2.9(L) 3.0(L)  AST 15 - 41 U/L 35 33 34  ALT 0 - 44 U/L 45(H) 45(H) 49(H)  Alk Phosphatase 38 - 126 U/L 77 76 76  Total Bilirubin 0.3 - 1.2 mg/dL 0.7 0.4 0.7     2.  Morbid obesity.  BMI of greater than 40.  Follow with PCP for weight loss..   3.  Asthma.  Supportive care no wheezing.  4.  Essential hypertension.  Asked will monitor and adjust.  5.  GERD.  On Pepcid.   6.  Eczema.  Takes IL-3 blocker at home.  Resume once discharged.     Condition - Extremely Guarded  Family Communication  :  Wife on 12/6, Son 12/6, wife 12/7  Code Status : Full  Diet :    Diet Order            DIET SOFT Room service appropriate? Yes; Fluid consistency: Thin  Diet effective now               Disposition Plan  :  PCU  Consults  :    Procedures  :    PUD Prophylaxis : Pepcid  DVT Prophylaxis  :  Lovenox    Lab Results  Component Value Date   PLT 284 09/04/2019    Inpatient Medications  Scheduled Meds:  amLODipine  10 mg Oral Daily   enoxaparin (LOVENOX) injection  80 mg Subcutaneous Q24H   famotidine  20 mg Oral Daily   hydrALAZINE  50 mg Oral Q8H   influenza vac split quadrivalent PF  0.5 mL Intramuscular Tomorrow-1000   insulin aspart  0-15 Units Subcutaneous Q4H   methylPREDNISolone (SOLU-MEDROL)  injection  60 mg Intravenous Q12H   pneumococcal 23 valent vaccine  0.5 mL Intramuscular Tomorrow-1000   sertraline  50 mg Oral Daily   vitamin C  500 mg Oral BID   Continuous Infusions:  remdesivir 100 mg in NS 100 mL 100 mg (09/04/19 0910)   PRN Meds:.albuterol, sodium chloride  Antibiotics  :    Anti-infectives (From admission, onward)   Start     Dose/Rate Route Frequency Ordered Stop   09/02/19 1000  remdesivir 100 mg in sodium chloride 0.9 % 100 mL IVPB     100 mg 200 mL/hr over 30 Minutes Intravenous Every 24 hours 09/01/19 1626 09/06/19 0959   09/01/19 1700  remdesivir 200 mg in sodium chloride 0.9% 250 mL IVPB     200 mg 580 mL/hr over 30 Minutes Intravenous Once 09/01/19 1626 09/02/19 1400       Time Spent in minutes  30   Lala Lund M.D on 09/04/2019 at 9:51 AM  To page go to [email or web address] - password [facility]  [facility] -  Office  [phone]    See all Orders from today for further details    Objective:   Vitals:   09/03/19 1901 09/03/19 2045 09/03/19 2303 09/04/19 0500  BP:  (!) 147/90  Marland Kitchen)  149/84  Pulse:  88  76  Resp: (!) 22 (!) 22  (!) 22  Temp:  97.8 F (36.6 C)  (!) 97.3 F (36.3 C)  TempSrc:  Oral  Axillary  SpO2: 92%   90%  Weight:   (!) 158.2 kg   Height:   '5\' 8"'$  (1.727 m)     Wt Readings from Last 3 Encounters:  09/03/19 (!) 158.2 kg  08/04/19 (!) 161.3 kg  06/23/19 (!) 159.2 kg     Intake/Output Summary (Last 24 hours) at 09/04/2019 0951 Last data filed at 09/04/2019 0900 Gross per 24 hour  Intake 680 ml  Output 1500 ml  Net -820 ml     Physical Exam  Awake Alert,   No new F.N deficits, Normal affect [place].AT,PERRAL Supple Neck,No JVD, No cervical lymphadenopathy appriciated.  Symmetrical Chest wall movement, Good air movement bilaterally, CTAB RRR,No Gallops, Rubs or new Murmurs, No Parasternal Heave +ve B.Sounds, Abd Soft, No tenderness, No organomegaly appriciated, No rebound - guarding or rigidity. No  Cyanosis, Clubbing or edema, No new Rash or bruise    Data Review:    CBC Recent Labs  Lab 09/01/19 1422 09/03/19 0212 09/04/19 0253  WBC 9.3 7.9 10.9*  HGB 15.8 15.6 15.5  HCT 49.8 50.1 49.6  PLT 233 261 284  MCV 84.1 85.1 83.4  MCH 26.7 26.5 26.1  MCHC 31.7 31.1 31.3  RDW 14.9 15.8* 15.4  LYMPHSABS 0.7 0.9 1.3  MONOABS 0.4 0.3 0.5  EOSABS 0.0 0.0 0.0  BASOSABS 0.0 0.0 0.1    Chemistries  Recent Labs  Lab 09/01/19 1422 09/02/19 1015 09/03/19 0212 09/04/19 0253  NA 135 139 139 140  K 4.3 4.4 4.6 4.9  CL 101 102 98 104  CO2 '23 23 28 23  '$ GLUCOSE 125* 130* 134* 137*  BUN 16 29* 43* 44*  CREATININE 1.07 1.14 1.21 1.16  CALCIUM 8.2* 8.4* 8.5* 8.4*  MG  --  2.1 2.5* 2.3  AST 42* 34 33 35  ALT 51* 49* 45* 45*  ALKPHOS 82 76 76 77  BILITOT 0.6 0.7 0.4 0.7   ------------------------------------------------------------------------------------------------------------------ Recent Labs    09/01/19 1422  TRIG 64    Lab Results  Component Value Date   HGBA1C 6.6 (H) 09/02/2019   ------------------------------------------------------------------------------------------------------------------ No results for input(s): TSH, T4TOTAL, T3FREE, THYROIDAB in the last 72 hours.  Invalid input(s): FREET3  Cardiac Enzymes No results for input(s): CKMB, TROPONINI, MYOGLOBIN in the last 168 hours.  Invalid input(s): CK ------------------------------------------------------------------------------------------------------------------    Component Value Date/Time   BNP 25.3 09/04/2019 0253   BNP 18.7 09/22/2016 1614    Micro Results Recent Results (from the past 240 hour(s))  Blood Culture (routine x 2)     Status: None (Preliminary result)   Collection Time: 09/01/19  2:22 PM   Specimen: BLOOD  Result Value Ref Range Status   Specimen Description BLOOD LEFT ANTECUBITAL  Final   Special Requests   Final    BOTTLES DRAWN AEROBIC AND ANAEROBIC Blood Culture adequate  volume   Culture   Final    NO GROWTH 2 DAYS Performed at [facility], [address] [place], [place] [address]    Report Status PENDING  Incomplete  Blood Culture (routine x 2)     Status: None (Preliminary result)   Collection Time: 09/01/19 11:30 PM   Specimen: BLOOD  Result Value Ref Range Status   Specimen Description BLOOD RIGHT ARM  Final   Special Requests  Final    BOTTLES DRAWN AEROBIC AND ANAEROBIC Blood Culture adequate volume   Culture   Final    NO GROWTH 1 DAY Performed at [facility], [place] [address], [place], [place] [address]    Report Status PENDING  Incomplete    Radiology Reports Dg Chest Port 1 View  Result Date: 09/02/2019 CLINICAL DATA:  COVID-19 patient with fever. EXAM: PORTABLE CHEST 1 VIEW COMPARISON:  September 01, 2019 FINDINGS: Bilateral pulmonary infiltrates remain, similar given difference in technique in the interval. No other interval changes. IMPRESSION: Significant bilateral pulmonary infiltrates remain. Given difference in technique/penetration, there has been no definitive interval change. The findings are consistent with the patient's COVID-19 positive status. Electronically Signed   By: Dorise Bullion III M.D   On: 09/02/2019 21:01   Dg Chest Port 1 View  Result Date: 09/01/2019 CLINICAL DATA:  Shortness of breath, COVID-19 positive EXAM: PORTABLE CHEST 1 VIEW COMPARISON:  01/18/2019 FINDINGS: The heart size and mediastinal contours are within normal limits. Multifocal airspace consolidations within a predominantly peripheral distribution. No large pleural fluid collection. No pneumothorax. The visualized skeletal structures are unremarkable. IMPRESSION: Extensive multifocal airspace opacities in a predominantly peripheral distribution suggestive of multifocal viral pneumonia. Electronically Signed   By: Davina Poke M.D.   On: 09/01/2019 15:05

## 2019-09-04 NOTE — Progress Notes (Signed)
Oxygen sats at 96%.  Titrating o2 flow from 8L to 7L.  Will continue to monitor.

## 2019-09-04 NOTE — Progress Notes (Signed)
PT Cancellation Note  Patient Details Name: Goran Olden MRN: [id number] DOB: 04-15-1966   Cancelled Treatment:    Reason Eval/Treat Not Completed: Fatigue/lethargy limiting ability to participate, patient in recliner, reports recently ambulated to BR, now SOB. Will check back tomorrow.   Claretha Cooper 09/04/2019, 4:04 PM [place] Pager [phone] Office [phone]

## 2019-09-04 NOTE — Progress Notes (Signed)
Increased flow rate to 8L, Patient o2 sats teetering between 89-89% on 7L

## 2019-09-05 LAB — GLUCOSE, CAPILLARY
Glucose-Capillary: 119 mg/dL — ABNORMAL HIGH (ref 70–99)
Glucose-Capillary: 142 mg/dL — ABNORMAL HIGH (ref 70–99)
Glucose-Capillary: 125 mg/dL — ABNORMAL HIGH (ref 70–99)
Glucose-Capillary: 157 mg/dL — ABNORMAL HIGH (ref 70–99)
Glucose-Capillary: 188 mg/dL — ABNORMAL HIGH (ref 70–99)
Glucose-Capillary: 187 mg/dL — ABNORMAL HIGH (ref 70–99)
Glucose-Capillary: 157 mg/dL — ABNORMAL HIGH (ref 70–99)

## 2019-09-05 LAB — COMPREHENSIVE METABOLIC PANEL
ALT: 61 U/L — ABNORMAL HIGH (ref 0–44)
AST: 32 U/L (ref 15–41)
Albumin: 3 g/dL — ABNORMAL LOW (ref 3.5–5.0)
Alkaline Phosphatase: 78 U/L (ref 38–126)
Anion gap: 12 (ref 5–15)
BUN: 45 mg/dL — ABNORMAL HIGH (ref 6–20)
CO2: 28 mmol/L (ref 22–32)
Calcium: 8.4 mg/dL — ABNORMAL LOW (ref 8.9–10.3)
Chloride: 101 mmol/L (ref 98–111)
Creatinine, Ser: 1.12 mg/dL (ref 0.61–1.24)
GFR calc Af Amer: >60 mL/min (ref >60)
GFR calc non Af Amer: >60 mL/min (ref >60)
Glucose, Bld: 138 mg/dL — ABNORMAL HIGH (ref 70–99)
Potassium: 4.8 mmol/L (ref 3.5–5.1)
Sodium: 141 mmol/L (ref 135–145)
Total Bilirubin: 0.7 mg/dL (ref 0.3–1.2)
Total Protein: 6.3 g/dL — ABNORMAL LOW (ref 6.5–8.1)

## 2019-09-05 LAB — BRAIN NATRIURETIC PEPTIDE: B Natriuretic Peptide: 32.2 pg/mL (ref 0.0–100.0)

## 2019-09-05 LAB — CBC WITH DIFFERENTIAL/PLATELET
Abs Immature Granulocytes: 0.68 10*3/uL — ABNORMAL HIGH (ref 0.00–0.07)
Basophils Absolute: 0 10*3/uL (ref 0.0–0.1)
Basophils Relative: 0 %
Eosinophils Absolute: 0 10*3/uL (ref 0.0–0.5)
Eosinophils Relative: 0 %
HCT: 52 % (ref 39.0–52.0)
Hemoglobin: 15.9 g/dL (ref 13.0–17.0)
Immature Granulocytes: 5 %
Lymphocytes Relative: 10 %
Lymphs Abs: 1.4 10*3/uL (ref 0.7–4.0)
MCH: 26.2 pg (ref 26.0–34.0)
MCHC: 30.6 g/dL (ref 30.0–36.0)
MCV: 85.5 fL (ref 80.0–100.0)
Monocytes Absolute: 0.7 10*3/uL (ref 0.1–1.0)
Monocytes Relative: 5 %
Neutro Abs: 11.5 10*3/uL — ABNORMAL HIGH (ref 1.7–7.7)
Neutrophils Relative %: 80 %
Platelets: 343 10*3/uL (ref 150–400)
RBC: 6.08 MIL/uL — ABNORMAL HIGH (ref 4.22–5.81)
RDW: 16 % — ABNORMAL HIGH (ref 11.5–15.5)
WBC: 14.3 10*3/uL — ABNORMAL HIGH (ref 4.0–10.5)
nRBC: 0 % (ref 0.0–0.2)

## 2019-09-05 LAB — D-DIMER, QUANTITATIVE: D-Dimer, Quant: 0.61 ug/mL-FEU — ABNORMAL HIGH (ref 0.00–0.50)

## 2019-09-05 LAB — C-REACTIVE PROTEIN: CRP: 1.9 mg/dL — ABNORMAL HIGH (ref <1.0)

## 2019-09-05 LAB — MAGNESIUM: Magnesium: 2.4 mg/dL (ref 1.7–2.4)

## 2019-09-05 LAB — PROCALCITONIN: Procalcitonin: <0.1 ng/mL

## 2019-09-05 MED ORDER — CARVEDILOL 3.125 MG PO TABS
6.2500 mg | ORAL_TABLET | Freq: Two times a day (BID) | ORAL | Status: DC
  Administered 2019-09-05 (×2): 6.25 mg via ORAL
  Filled 2019-09-05 (×2): qty 2

## 2019-09-05 NOTE — Progress Notes (Signed)
PROGRESS NOTE                                                                                                                                                                                                             Patient Demographics:    Don [name], is a 53 y.o. male, DOB - Nov 05, 1965, TZG:[id number]  Outpatient Primary MD for the patient is [name], [place], MD    LOS - 4  Admit date - 09/01/2019    Chief Complaint  Patient presents with  . Shortness of Breath       Brief Narrative  53 yo male with medical history of morbid obesity, asthma on IL-3 blocker shots twice a month, essential hypertension who was diagnosed with COVID-19 infection about 8 days ago finally presented with severe shortness of breath ongoing for 3 to 4 days.  Was diagnosed with severe acute hypoxic respiratory failure and admitted by ICU team on 09/01/2019, he was started on Decadron and remdesivir and transferred to my care on 09/02/2019.  He has arrived to the hospital and intense respiratory distress on 15 L high flow nasal cannula oxygen.   Subjective:   Patient in bed, appears comfortable, denies any headache, no fever, no chest pain or pressure, much improved shortness of breath , no abdominal pain. No focal weakness.    Assessment  & Plan :   1. Acute Hypoxic Resp. Failure due to Acute Covid 19 Viral Pneumonitis during the ongoing 2020 Covid 19 Pandemic - he has severe disease and presented after 4 days of symptoms, he was in the ER on day 1 under ICU team.  He had received Decadron and remdesivir 1 dose under the ICU team.  He was transferred to my care on 09/02/2019, he immediately received a dose of Actemra x 2 after appropriate consent, clinically he has shown good improvement on a daily basis, now down to 10 L nasal cannula oxygen, when he moves his oxygen requirement temporarily may increase but at rest definitely he is much better than  what he came in with.  We will continue monitoring him closely, monitor inflammatory markers which are improving as well.  Has been encouraged to sit up in chair in the daytime use I-S and flutter valve for pulmonary toiletry and if possible prone in bed at night.   SpO2: 91 %  O2 Flow Rate (L/min): 10 L/min  Recent Labs  Lab 09/01/19 1422 09/02/19 0530 09/02/19 1015 09/03/19 0212 09/04/19 0253 09/05/19 0330  CRP 11.1* 13.4* 12.3* 9.7* 4.2* 1.9*  DDIMER 1.11* 0.99* 0.91* 0.84* 0.56* 0.61*  FERRITIN 553* 691*  --   --   --   --   BNP  --   --  36.9 28.5 25.3 32.2  PROCALCITON 0.12  --  0.12 <0.10 0.10  --     Hepatic Function Latest Ref Rng & Units 09/05/2019 09/04/2019 09/03/2019  Total Protein 6.5 - 8.1 g/dL 6.3(L) 6.3(L) 6.6  Albumin 3.5 - 5.0 g/dL 3.0(L) 2.9(L) 2.9(L)  AST 15 - 41 U/L 32 35 33  ALT 0 - 44 U/L 61(H) 45(H) 45(H)  Alk Phosphatase 38 - 126 U/L 78 77 76  Total Bilirubin 0.3 - 1.2 mg/dL 0.7 0.7 0.4     2.  Morbid obesity.  BMI of greater than 40.  Follow with PCP for weight loss..   3.  Asthma.  Supportive care no wheezing.  4.  Essential hypertension.  Blood pressure high placed on Norvasc and hydralazine, have added Coreg for better control  5.  GERD.  On Pepcid.   6.  Eczema.  Takes IL-3 blocker at home.  Resume once discharged.     Condition - Extremely Guarded  Family Communication  :  Wife on 12/6, Son 12/6, wife 12/7, 12/8  Code Status : Full  Diet :    Diet Order            DIET SOFT Room service appropriate? Yes; Fluid consistency: Thin  Diet effective now               Disposition Plan  :  PCU  Consults  :    Procedures  :    PUD Prophylaxis : Pepcid  DVT Prophylaxis  :  Lovenox    Lab Results  Component Value Date   PLT 343 09/05/2019    Inpatient Medications  Scheduled Meds: . amLODipine  10 mg Oral Daily  . carvedilol  6.25 mg Oral BID WC  . enoxaparin (LOVENOX) injection  80 mg Subcutaneous Q24H  .  famotidine  20 mg Oral Daily  . hydrALAZINE  50 mg Oral Q8H  . influenza vac split quadrivalent PF  0.5 mL Intramuscular Tomorrow-1000  . insulin aspart  0-15 Units Subcutaneous Q4H  . methylPREDNISolone (SOLU-MEDROL) injection  60 mg Intravenous Q12H  . pneumococcal 23 valent vaccine  0.5 mL Intramuscular Tomorrow-1000  . sertraline  50 mg Oral Daily  . vitamin C  500 mg Oral BID   Continuous Infusions:  PRN Meds:.albuterol, sodium chloride  Antibiotics  :    Anti-infectives (From admission, onward)   Start     Dose/Rate Route Frequency Ordered Stop   09/02/19 1000  remdesivir 100 mg in sodium chloride 0.9 % 100 mL IVPB     100 mg 200 mL/hr over 30 Minutes Intravenous Every 24 hours 09/01/19 1626 09/05/19 0848   09/01/19 1700  remdesivir 200 mg in sodium chloride 0.9% 250 mL IVPB     200 mg 580 mL/hr over 30 Minutes Intravenous Once 09/01/19 1626 09/02/19 1400       Time Spent in minutes  30   Lala Lund M.D on 09/05/2019 at 9:16 AM  To page go to [email or web address] - password [email or web address]  [facility] -  Office  [phone]    See all Orders from today for further details  Objective:   Vitals:   09/05/19 0303 09/05/19 0540 09/05/19 0602 09/05/19 0654  BP:  (!) 165/89 (!) 165/89   Pulse: 73 89  76  Resp: 19 (!) 28  (!) 28  Temp:  (!) 97.4 F (36.3 C)    TempSrc:  Oral    SpO2: (!) 85% 90%  91%  Weight:      Height:        Wt Readings from Last 3 Encounters:  09/03/19 (!) 158.2 kg  08/04/19 (!) 161.3 kg  06/23/19 (!) 159.2 kg     Intake/Output Summary (Last 24 hours) at 09/05/2019 0916 Last data filed at 09/05/2019 0751 Gross per 24 hour  Intake -  Output 850 ml  Net -850 ml     Physical Exam  Awake Alert,   No new F.N deficits, Normal affect Byng.AT,PERRAL Supple Neck,No JVD, No cervical lymphadenopathy appriciated.  Symmetrical Chest wall movement, Good air movement bilaterally, CTAB RRR,No Gallops, Rubs or new Murmurs, No Parasternal Heave  +ve B.Sounds, Abd Soft, No tenderness, No organomegaly appriciated, No rebound - guarding or rigidity. No Cyanosis, Clubbing or edema, No new Rash or bruise    Data Review:    CBC Recent Labs  Lab 09/01/19 1422 09/03/19 0212 09/04/19 0253 09/05/19 0330  WBC 9.3 7.9 10.9* 14.3*  HGB 15.8 15.6 15.5 15.9  HCT 49.8 50.1 49.6 52.0  PLT 233 261 284 343  MCV 84.1 85.1 83.4 85.5  MCH 26.7 26.5 26.1 26.2  MCHC 31.7 31.1 31.3 30.6  RDW 14.9 15.8* 15.4 16.0*  LYMPHSABS 0.7 0.9 1.3 1.4  MONOABS 0.4 0.3 0.5 0.7  EOSABS 0.0 0.0 0.0 0.0  BASOSABS 0.0 0.0 0.1 0.0    Chemistries  Recent Labs  Lab 09/01/19 1422 09/02/19 1015 09/03/19 0212 09/04/19 0253 09/05/19 0330  NA 135 139 139 140 141  K 4.3 4.4 4.6 4.9 4.8  CL 101 102 98 104 101  CO2 '23 23 28 23 28  '$ GLUCOSE 125* 130* 134* 137* 138*  BUN 16 29* 43* 44* 45*  CREATININE 1.07 1.14 1.21 1.16 1.12  CALCIUM 8.2* 8.4* 8.5* 8.4* 8.4*  MG  --  2.1 2.5* 2.3 2.4  AST 42* 34 33 35 32  ALT 51* 49* 45* 45* 61*  ALKPHOS 82 76 76 77 78  BILITOT 0.6 0.7 0.4 0.7 0.7   ------------------------------------------------------------------------------------------------------------------ No results for input(s): CHOL, HDL, LDLCALC, TRIG, CHOLHDL, LDLDIRECT in the last 72 hours.  Lab Results  Component Value Date   HGBA1C 6.6 (H) 09/02/2019   ------------------------------------------------------------------------------------------------------------------ No results for input(s): TSH, T4TOTAL, T3FREE, THYROIDAB in the last 72 hours.  Invalid input(s): FREET3  Cardiac Enzymes No results for input(s): CKMB, TROPONINI, MYOGLOBIN in the last 168 hours.  Invalid input(s): CK ------------------------------------------------------------------------------------------------------------------    Component Value Date/Time   BNP 32.2 09/05/2019 0330   BNP 18.7 09/22/2016 1614    Micro Results Recent Results (from the past 240 hour(s))  Blood  Culture (routine x 2)     Status: None (Preliminary result)   Collection Time: 09/01/19  2:22 PM   Specimen: BLOOD  Result Value Ref Range Status   Specimen Description BLOOD LEFT ANTECUBITAL  Final   Special Requests   Final    BOTTLES DRAWN AEROBIC AND ANAEROBIC Blood Culture adequate volume   Culture   Final    NO GROWTH 4 DAYS Performed at [facility], [address], [place], [place] [address]    Report Status PENDING  Incomplete  Blood Culture (routine x 2)     Status: None (Preliminary result)   Collection Time: 09/01/19 11:30 PM   Specimen: BLOOD  Result Value Ref Range Status   Specimen Description BLOOD RIGHT ARM  Final   Special Requests   Final    BOTTLES DRAWN AEROBIC AND ANAEROBIC Blood Culture adequate volume   Culture   Final    NO GROWTH 3 DAYS Performed at [facility], [address], [place], [place] [address]    Report Status PENDING  Incomplete    Radiology Reports Dg Chest Port 1 View  Result Date: 09/02/2019 CLINICAL DATA:  COVID-19 patient with fever. EXAM: PORTABLE CHEST 1 VIEW COMPARISON:  September 01, 2019 FINDINGS: Bilateral pulmonary infiltrates remain, similar given difference in technique in the interval. No other interval changes. IMPRESSION: Significant bilateral pulmonary infiltrates remain. Given difference in technique/penetration, there has been no definitive interval change. The findings are consistent with the patient's COVID-19 positive status. Electronically Signed   By: Dorise Bullion III M.D   On: 09/02/2019 21:01   Dg Chest Port 1 View  Result Date: 09/01/2019 CLINICAL DATA:  Shortness of breath, COVID-19 positive EXAM: PORTABLE CHEST 1 VIEW COMPARISON:  01/18/2019 FINDINGS: The heart size and mediastinal contours are within normal limits. Multifocal airspace consolidations within a predominantly peripheral distribution. No large pleural fluid collection. No pneumothorax. The visualized skeletal structures are unremarkable.  IMPRESSION: Extensive multifocal airspace opacities in a predominantly peripheral distribution suggestive of multifocal viral pneumonia. Electronically Signed   By: Davina Poke M.D.   On: 09/01/2019 15:05

## 2019-09-05 NOTE — Evaluation (Signed)
Physical Therapy Evaluation Patient Details Name: Don [name] MRN: [id number] DOB: Jan 03, 1966 Today's Date: 09/05/2019   History of Present Illness  53 yo male with medical history of morbid obesity, asthma on IL-3 blocker shots twice a month, essential hypertension who was diagnosed with COVID-19 infection about 8 days ago finally presented with severe shortness of breath ongoing for 3 to 4 days.  Was diagnosed with severe acute hypoxic respiratory failure and admitted by ICU team on 09/01/2019, he was started on Decadron and remdesivir and transferred to my care on 09/02/2019.  He has arrived to the hospital and intense respiratory distress on 15 L high flow nasal cannula oxygen.  Clinical Impression   The patient received sitting in recliner on 10 L HFNC.  Patient ambulated x 25' x 3 with SPo2 from 92% to 83% each walk. RR from 12 -34 .Recovery time less than 1 minute to 89%, encouraged use of flutter for recovery period vs. Increasing O2. Patient noted -4/4  Dyspnea, no accessory nor abdominal breathing. While resting decreased SPO2 to 6 l with SPO2 94-95%. . Continue PT for progressive mobility and titrating O2 as tolerated.    Follow Up Recommendations Home health PT    Equipment Recommendations  None recommended by PT    Recommendations for Other Services       Precautions / Restrictions Precautions Precaution Comments: monitor sats, on HFNC try to wean down      Mobility  Bed Mobility               General bed mobility comments: OOB  Transfers   Equipment used: None   Sit to Stand: Supervision            Ambulation/Gait Ambulation/Gait assistance: Supervision Gait Distance (Feet): 25 Feet(x 3) Assistive device: None Gait Pattern/deviations: Step-through pattern Gait velocity: decr   General Gait Details: gait is slow and determined, concentrates on breathing  Stairs            Wheelchair Mobility    Modified Rankin (Stroke Patients Only)        Balance Overall balance assessment: No apparent balance deficits (not formally assessed)                                           Pertinent Vitals/Pain Pain Assessment: No/denies pain    Home Living Family/patient expects to be discharged to:: Private residence Living Arrangements: Spouse/significant other Available Help at Discharge: Family Type of Home: House Home Access: Stairs to enter     Home Layout: Two level;Able to live on main level with bedroom/bathroom Home Equipment: Shower seat      Prior Function Level of Independence: Independent               Hand Dominance        Extremity/Trunk Assessment   Upper Extremity Assessment Upper Extremity Assessment: Generalized weakness    Lower Extremity Assessment Lower Extremity Assessment: Generalized weakness    Cervical / Trunk Assessment Cervical / Trunk Assessment: Normal  Communication   Communication: No difficulties  Cognition Arousal/Alertness: Awake/alert Behavior During Therapy: WFL for tasks assessed/performed Overall Cognitive Status: Within Functional Limits for tasks assessed  General Comments      Exercises     Assessment/Plan    PT Assessment Patient needs continued PT services  PT Problem List Decreased mobility;Cardiopulmonary status limiting activity;Decreased activity tolerance       PT Treatment Interventions Gait training;Functional mobility training;Therapeutic exercise;Patient/family education    PT Goals (Current goals can be found in the Care Plan section)  Acute Rehab PT Goals Patient Stated Goal: To be able to breath PT Goal Formulation: With patient Time For Goal Achievement: 09/19/19 Potential to Achieve Goals: Good    Frequency Min 3X/week   Barriers to discharge        Co-evaluation               AM-PAC PT "6 Clicks" Mobility  Outcome Measure Help needed turning  from your back to your side while in a flat bed without using bedrails?: None Help needed moving from lying on your back to sitting on the side of a flat bed without using bedrails?: None Help needed moving to and from a bed to a chair (including a wheelchair)?: None Help needed standing up from a chair using your arms (e.g., wheelchair or bedside chair)?: None Help needed to walk in hospital room?: A Little Help needed climbing 3-5 steps with a railing? : A Little 6 Click Score: 22    End of Session Equipment Utilized During Treatment: Oxygen Activity Tolerance: Treatment limited secondary to medical complications (Comment) Patient left: in chair;with call bell/phone within reach Nurse Communication: Mobility status PT Visit Diagnosis: Difficulty in walking, not elsewhere classified (R26.2);Unsteadiness on feet (R26.81)    Time: 9892-1194 PT Time Calculation (min) (ACUTE ONLY): 36 min   Charges:   PT Evaluation $PT Eval Moderate Complexity: 1 Mod PT Treatments $Gait Training: 8-22 mins        Tresa Endo PT Acute Rehabilitation Services Pager [phone] Office [phone]  Claretha Cooper 09/05/2019, 1:19 PM

## 2019-09-06 LAB — CULTURE, BLOOD (ROUTINE X 2)
Culture: NO GROWTH
Special Requests: ADEQUATE

## 2019-09-06 LAB — CBC WITH DIFFERENTIAL/PLATELET
Abs Immature Granulocytes: 0.61 10*3/uL — ABNORMAL HIGH (ref 0.00–0.07)
Basophils Absolute: 0.2 10*3/uL — ABNORMAL HIGH (ref 0.0–0.1)
Basophils Relative: 1 %
Eosinophils Absolute: 0 10*3/uL (ref 0.0–0.5)
Eosinophils Relative: 0 %
HCT: 51 % (ref 39.0–52.0)
Hemoglobin: 16.1 g/dL (ref 13.0–17.0)
Immature Granulocytes: 4 %
Lymphocytes Relative: 8 %
Lymphs Abs: 1.3 10*3/uL (ref 0.7–4.0)
MCH: 26.5 pg (ref 26.0–34.0)
MCHC: 31.6 g/dL (ref 30.0–36.0)
MCV: 84 fL (ref 80.0–100.0)
Monocytes Absolute: 0.6 10*3/uL (ref 0.1–1.0)
Monocytes Relative: 4 %
Neutro Abs: 13.4 10*3/uL — ABNORMAL HIGH (ref 1.7–7.7)
Neutrophils Relative %: 83 %
Platelets: 335 10*3/uL (ref 150–400)
RBC: 6.07 MIL/uL — ABNORMAL HIGH (ref 4.22–5.81)
RDW: 15.7 % — ABNORMAL HIGH (ref 11.5–15.5)
WBC: 16.1 10*3/uL — ABNORMAL HIGH (ref 4.0–10.5)
nRBC: 0 % (ref 0.0–0.2)

## 2019-09-06 LAB — COMPREHENSIVE METABOLIC PANEL
ALT: 55 U/L — ABNORMAL HIGH (ref 0–44)
AST: 22 U/L (ref 15–41)
Albumin: 2.9 g/dL — ABNORMAL LOW (ref 3.5–5.0)
Alkaline Phosphatase: 76 U/L (ref 38–126)
Anion gap: 11 (ref 5–15)
BUN: 40 mg/dL — ABNORMAL HIGH (ref 6–20)
CO2: 25 mmol/L (ref 22–32)
Calcium: 8.2 mg/dL — ABNORMAL LOW (ref 8.9–10.3)
Chloride: 103 mmol/L (ref 98–111)
Creatinine, Ser: 0.98 mg/dL (ref 0.61–1.24)
GFR calc Af Amer: >60 mL/min (ref >60)
GFR calc non Af Amer: >60 mL/min (ref >60)
Glucose, Bld: 119 mg/dL — ABNORMAL HIGH (ref 70–99)
Potassium: 4.3 mmol/L (ref 3.5–5.1)
Sodium: 139 mmol/L (ref 135–145)
Total Bilirubin: 0.7 mg/dL (ref 0.3–1.2)
Total Protein: 5.8 g/dL — ABNORMAL LOW (ref 6.5–8.1)

## 2019-09-06 LAB — MAGNESIUM: Magnesium: 2.5 mg/dL — ABNORMAL HIGH (ref 1.7–2.4)

## 2019-09-06 LAB — GLUCOSE, CAPILLARY
Glucose-Capillary: 139 mg/dL — ABNORMAL HIGH (ref 70–99)
Glucose-Capillary: 113 mg/dL — ABNORMAL HIGH (ref 70–99)
Glucose-Capillary: 175 mg/dL — ABNORMAL HIGH (ref 70–99)
Glucose-Capillary: 168 mg/dL — ABNORMAL HIGH (ref 70–99)
Glucose-Capillary: 192 mg/dL — ABNORMAL HIGH (ref 70–99)
Glucose-Capillary: 133 mg/dL — ABNORMAL HIGH (ref 70–99)

## 2019-09-06 LAB — PROCALCITONIN: Procalcitonin: <0.1 ng/mL

## 2019-09-06 LAB — D-DIMER, QUANTITATIVE: D-Dimer, Quant: 0.66 ug/mL-FEU — ABNORMAL HIGH (ref 0.00–0.50)

## 2019-09-06 LAB — C-REACTIVE PROTEIN: CRP: 0.9 mg/dL (ref <1.0)

## 2019-09-06 LAB — BRAIN NATRIURETIC PEPTIDE: B Natriuretic Peptide: 41.4 pg/mL (ref 0.0–100.0)

## 2019-09-06 MED ORDER — METHYLPREDNISOLONE SODIUM SUCC 40 MG IJ SOLR
40.0000 mg | Freq: Two times a day (BID) | INTRAMUSCULAR | Status: DC
  Administered 2019-09-06 (×2): 40 mg via INTRAVENOUS
  Filled 2019-09-06 (×2): qty 1

## 2019-09-06 MED ORDER — CARVEDILOL 12.5 MG PO TABS
12.5000 mg | ORAL_TABLET | Freq: Two times a day (BID) | ORAL | Status: DC
  Administered 2019-09-06 – 2019-09-08 (×5): 12.5 mg via ORAL
  Filled 2019-09-06 (×5): qty 1

## 2019-09-06 NOTE — Progress Notes (Signed)
Occupational Therapy Treatment Patient Details Name: Don [name] MRN: [id number] DOB: [birth year] Today's Date: 09/06/2019    History of present illness 53 yo male with medical history of morbid obesity, asthma on IL-3 blocker shots twice a month, essential hypertension who was diagnosed with COVID-19 infection about 8 days ago finally presented with severe shortness of breath ongoing for 3 to 4 days.  Was diagnosed with severe acute hypoxic respiratory failure and admitted by ICU team on 09/01/2019, he was started on Decadron and remdesivir and transferred to my care on 09/02/2019.  He has arrived to the hospital and intense respiratory distress on 15 L high flow nasal cannula oxygen.   OT comments  Pt making slow progress in therapy, continuing to require 12-15L HFNC. Pt tolerated standing 1 x 2.5 min and 1 x 1.5 min with supervision while engaging in grooming, hygiene, and sponge bathing task at the sink. Pt required mod seated rest breaks throughout due to SOB and fatigue. Pt's O2 SATs dropped to low 80s on 15L HFNC with pt reporting mod SOB. Continued education with pt on relaxation strategies to address anxiety related to SOB. Continued education/instruction with pt on breathing exercises, specifically pursed lip breathing with fair understanding and follow through. 3/4 DOE. Pt weaned down to 12L HFNC at end of session with O2 SATs in high 90s. Pt requires significant increase in time to complete all tasks. All questions/concerns answered at this time.    Follow Up Recommendations  No OT follow up    Equipment Recommendations  None recommended by OT    Recommendations for Other Services      Precautions / Restrictions Precautions Precautions: Fall Restrictions Weight Bearing Restrictions: No       Mobility Bed Mobility                  Transfers Overall transfer level: Needs assistance Equipment used: None Transfers: Sit to/from Stand Sit to Stand: Supervision          General transfer comment: Pt ambulated to/from sink with supervision and without use of assistive device. Noted 0 instances of LOB.    Balance Overall balance assessment: Mild deficits observed, not formally tested                                         ADL either performed or assessed with clinical judgement   ADL Overall ADL's : Needs assistance/impaired     Grooming: Supervision/safety;Standing   Upper Body Bathing: Supervision/ safety;Standing   Lower Body Bathing: Supervison/ safety;Sit to/from stand                       Functional mobility during ADLs: Supervision/safety General ADL Comments: Pt tolerated standing 1 x 2.5 min and 1 x 1.5 min at the sink to complete simple grooming and bathing task. Bathing task discontinued halfway through due to fatigue and SOB. Pt declined finishing task in sitting.     Vision       Perception     Praxis      Cognition Arousal/Alertness: Awake/alert Behavior During Therapy: WFL for tasks assessed/performed Overall Cognitive Status: Within Functional Limits for tasks assessed  Exercises Exercises: Other exercises Other Exercises Other Exercises: Incentive spirometer x 10 with min cues on technique. Pulling 1253mL Other Exercises: Flutter valve x 10 with min cues on technique. Other Exercises: Pursed lip breathing with mod cues on technique.   Shoulder Instructions       General Comments Continued education with pt on safety strategies, activity modifications, energy conservation techniques, and breathing exercises. Continued education with pt on relaxation strategies to address anxiety related to SOB.     Pertinent Vitals/ Pain       Pain Assessment: No/denies pain  Home Living                                          Prior Functioning/Environment              Frequency           Progress Toward  Goals  OT Goals(current goals can now be found in the care plan section)  Progress towards OT goals: Progressing toward goals  ADL Goals Pt Will Perform Grooming: Independently;standing Pt Will Perform Lower Body Bathing: Independently;sit to/from stand Pt Will Perform Lower Body Dressing: Independently;sit to/from stand Pt Will Transfer to Toilet: Independently;regular height toilet;ambulating Pt Will Perform Toileting - Clothing Manipulation and hygiene: Independently;sit to/from stand Additional ADL Goal #1: Pt to recall and demonstrate breathing exercises with 0 verbal cues on technique.  Plan Discharge plan remains appropriate;Frequency remains appropriate    Co-evaluation                 AM-PAC OT "6 Clicks" Daily Activity     Outcome Measure   Help from another person eating meals?: None Help from another person taking care of personal grooming?: A Little Help from another person toileting, which includes using toliet, bedpan, or urinal?: A Little Help from another person bathing (including washing, rinsing, drying)?: A Little Help from another person to put on and taking off regular upper body clothing?: A Little Help from another person to put on and taking off regular lower body clothing?: A Little 6 Click Score: 19    End of Session Equipment Utilized During Treatment: Oxygen  OT Visit Diagnosis: Muscle weakness (generalized) (M62.81);Unsteadiness on feet (R26.81)   Activity Tolerance Patient limited by fatigue(Limited by SOB)   Patient Left in chair;with call bell/phone within reach   Nurse Communication Mobility status        Time: 7591-6384 OT Time Calculation (min): 45 min  Charges: OT General Charges $OT Visit: 1 Visit OT Treatments $Self Care/Home Management : 8-22 mins $Therapeutic Activity: 23-37 mins  Mauri Brooklyn OTR/L [phone]    Mauri Brooklyn 09/06/2019, 2:11 PM

## 2019-09-06 NOTE — Progress Notes (Signed)
PROGRESS NOTE                                                                                                                                                                                                             Patient Demographics:    Don [name], is a 53 y.o. male, DOB - 1966/04/25, KDX:[id number]  Outpatient Primary MD for the patient is [name], [place], MD    LOS - 5  Admit date - 09/01/2019    Chief Complaint  Patient presents with  . Shortness of Breath       Brief Narrative  53 yo male with medical history of morbid obesity, asthma on IL-3 blocker shots twice a month, essential hypertension who was diagnosed with COVID-19 infection about 8 days ago finally presented with severe shortness of breath ongoing for 3 to 4 days.  Was diagnosed with severe acute hypoxic respiratory failure and admitted by ICU team on 09/01/2019, he was started on Decadron and remdesivir and transferred to my care on 09/02/2019.  He has arrived to the hospital and intense respiratory distress on 15 L high flow nasal cannula oxygen.   Subjective:   Patient in bed, appears comfortable, denies any headache, no fever, no chest pain or pressure, improved shortness of breath , no abdominal pain. No focal weakness.   Assessment  & Plan :   1. Acute Hypoxic Resp. Failure due to Acute Covid 19 Viral Pneumonitis during the ongoing 2020 Covid 19 Pandemic - he has severe disease and presented after 4 days of symptoms, he was in the ER on day 1 under ICU team.  He had received Decadron and remdesivir 1 dose under the ICU team.  He was transferred to my care on 09/02/2019, he immediately received a dose of Actemra x 2 after appropriate consent, clinically he has shown good improvement on a daily basis, now down to 8 L nasal cannula oxygen, when he moves his oxygen requirement temporarily may increase but at rest definitely he is much better than what he  came in with.  We will continue monitoring him closely, monitor inflammatory markers which are improving as well. Tapering steroids.  Has been encouraged to sit up in chair in the daytime use I-S and flutter valve for pulmonary toiletry and if possible prone in bed at night.   SpO2: 94 %  O2 Flow Rate (L/min): 8 L/min  Recent Labs  Lab 09/01/19 1422 09/02/19 0530 09/02/19 1015 09/03/19 0212 09/04/19 0253 09/05/19 0330 09/06/19 0323  CRP 11.1* 13.4* 12.3* 9.7* 4.2* 1.9* 0.9  DDIMER 1.11* 0.99* 0.91* 0.84* 0.56* 0.61* 0.66*  FERRITIN 553* 691*  --   --   --   --   --   BNP  --   --  36.9 28.5 25.3 32.2 41.4  PROCALCITON 0.12  --  0.12 <0.10 0.10 <0.10 <0.10    Hepatic Function Latest Ref Rng & Units 09/06/2019 09/05/2019 09/04/2019  Total Protein 6.5 - 8.1 g/dL 5.8(L) 6.3(L) 6.3(L)  Albumin 3.5 - 5.0 g/dL 2.9(L) 3.0(L) 2.9(L)  AST 15 - 41 U/L 22 32 35  ALT 0 - 44 U/L 55(H) 61(H) 45(H)  Alk Phosphatase 38 - 126 U/L 76 78 77  Total Bilirubin 0.3 - 1.2 mg/dL 0.7 0.7 0.7     2.  Morbid obesity.  BMI of greater than 40.  Follow with PCP for weight loss..   3.  Asthma.  Supportive care no wheezing.  4.  Essential hypertension.  Blood pressure high placed on Norvasc and hydralazine, added and inmcreased Coreg dose for better control  5.  GERD.  On Pepcid.   6.  Eczema.  Takes IL-3 blocker at home.  Resume once discharged.     Condition - Extremely Guarded  Family Communication  :  Wife on 12/6, Son 12/6, wife 12/7, 12/8, 12/9  Code Status : Full  Diet :    Diet Order            DIET SOFT Room service appropriate? Yes; Fluid consistency: Thin  Diet effective now               Disposition Plan  :  PCU  Consults  :    Procedures  :    PUD Prophylaxis : Pepcid  DVT Prophylaxis  :  Lovenox    Lab Results  Component Value Date   PLT 335 09/06/2019    Inpatient Medications  Scheduled Meds: . amLODipine  10 mg Oral Daily  . carvedilol  12.5 mg Oral  BID WC  . enoxaparin (LOVENOX) injection  80 mg Subcutaneous Q24H  . famotidine  20 mg Oral Daily  . hydrALAZINE  50 mg Oral Q8H  . influenza vac split quadrivalent PF  0.5 mL Intramuscular Tomorrow-1000  . insulin aspart  0-15 Units Subcutaneous Q4H  . methylPREDNISolone (SOLU-MEDROL) injection  40 mg Intravenous Q12H  . pneumococcal 23 valent vaccine  0.5 mL Intramuscular Tomorrow-1000  . sertraline  50 mg Oral Daily  . vitamin C  500 mg Oral BID   Continuous Infusions:  PRN Meds:.albuterol, sodium chloride  Antibiotics  :    Anti-infectives (From admission, onward)   Start     Dose/Rate Route Frequency Ordered Stop   09/02/19 1000  remdesivir 100 mg in sodium chloride 0.9 % 100 mL IVPB     100 mg 200 mL/hr over 30 Minutes Intravenous Every 24 hours 09/01/19 1626 09/05/19 0900   09/01/19 1700  remdesivir 200 mg in sodium chloride 0.9% 250 mL IVPB     200 mg 580 mL/hr over 30 Minutes Intravenous Once 09/01/19 1626 09/02/19 1400       Time Spent in minutes  30   Lala Lund M.D on 09/06/2019 at 10:25 AM  To page go to [email or web address] - password [facility]  [facility] -  Office  [phone]  See all Orders from today for further details    Objective:   Vitals:   09/05/19 1935 09/06/19 0420 09/06/19 0732 09/06/19 0800  BP: (!) 141/91 137/71 (!) 140/104   Pulse: 78 70 78   Resp: (!) 22 14 (!) 23   Temp: (!) 97.5 F (36.4 C) 98.2 F (36.8 C) (!) 97.4 F (36.3 C) (!) 97.5 F (36.4 C)  TempSrc: Oral Oral Oral   SpO2: (!) 87% 98% 94%   Weight:      Height:        Wt Readings from Last 3 Encounters:  09/03/19 (!) 158.2 kg  08/04/19 (!) 161.3 kg  06/23/19 (!) 159.2 kg     Intake/Output Summary (Last 24 hours) at 09/06/2019 1025 Last data filed at 09/06/2019 0800 Gross per 24 hour  Intake 360 ml  Output 1150 ml  Net -790 ml     Physical Exam  Awake Alert, Oriented X 3, No new F.N deficits, Normal affect [place].AT,PERRAL Supple Neck,No JVD, No  cervical lymphadenopathy appriciated.  Symmetrical Chest wall movement, Good air movement bilaterally, CTAB RRR,No Gallops, Rubs or new Murmurs, No Parasternal Heave +ve B.Sounds, Abd Soft, No tenderness, No organomegaly appriciated, No rebound - guarding or rigidity. No Cyanosis, Clubbing or edema, No new Rash or bruise     Data Review:    CBC Recent Labs  Lab 09/01/19 1422 09/03/19 0212 09/04/19 0253 09/05/19 0330 09/06/19 0323  WBC 9.3 7.9 10.9* 14.3* 16.1*  HGB 15.8 15.6 15.5 15.9 16.1  HCT 49.8 50.1 49.6 52.0 51.0  PLT 233 261 284 343 335  MCV 84.1 85.1 83.4 85.5 84.0  MCH 26.7 26.5 26.1 26.2 26.5  MCHC 31.7 31.1 31.3 30.6 31.6  RDW 14.9 15.8* 15.4 16.0* 15.7*  LYMPHSABS 0.7 0.9 1.3 1.4 1.3  MONOABS 0.4 0.3 0.5 0.7 0.6  EOSABS 0.0 0.0 0.0 0.0 0.0  BASOSABS 0.0 0.0 0.1 0.0 0.2*    Chemistries  Recent Labs  Lab 09/02/19 1015 09/03/19 0212 09/04/19 0253 09/05/19 0330 09/06/19 0323  NA 139 139 140 141 139  K 4.4 4.6 4.9 4.8 4.3  CL 102 98 104 101 103  CO2 _0 GLUCOSE 130* 134* 137* 138* 119*  BUN 29* 43* 44* 45* 40*  CREATININE 1.14 1.21 1.16 1.12 0.98  CALCIUM 8.4* 8.5* 8.4* 8.4* 8.2*  MG 2.1 2.5* 2.3 2.4 2.5*  AST 34 33 35 32 22  ALT 49* 45* 45* 61* 55*  ALKPHOS 76 76 77 78 76  BILITOT 0.7 0.4 0.7 0.7 0.7   ------------------------------------------------------------------------------------------------------------------ No results for input(s): CHOL, HDL, LDLCALC, TRIG, CHOLHDL, LDLDIRECT in the last 72 hours.  Lab Results  Component Value Date   HGBA1C 6.6 (H) 09/02/2019   ------------------------------------------------------------------------------------------------------------------ No results for input(s): TSH, T4TOTAL, T3FREE, THYROIDAB in the last 72 hours.  Invalid input(s): FREET3  Cardiac Enzymes No results for input(s): CKMB, TROPONINI, MYOGLOBIN in the last 168 hours.  Invalid input(s): CK  ------------------------------------------------------------------------------------------------------------------    Component Value Date/Time   BNP 41.4 09/06/2019 0323   BNP 18.7 09/22/2016 1614    Micro Results Recent Results (from the past 240 hour(s))  Blood Culture (routine x 2)     Status: None (Preliminary result)   Collection Time: 09/01/19  2:22 PM   Specimen: BLOOD  Result Value Ref Range Status   Specimen Description BLOOD LEFT ANTECUBITAL  Final   Special Requests   Final    BOTTLES DRAWN AEROBIC AND ANAEROBIC Blood Culture  adequate volume   Culture   Final    NO GROWTH 4 DAYS Performed at [facility], [place] [address], [place], [place] [address]    Report Status PENDING  Incomplete  Blood Culture (routine x 2)     Status: None (Preliminary result)   Collection Time: 09/01/19 11:30 PM   Specimen: BLOOD  Result Value Ref Range Status   Specimen Description BLOOD RIGHT ARM  Final   Special Requests   Final    BOTTLES DRAWN AEROBIC AND ANAEROBIC Blood Culture adequate volume   Culture   Final    NO GROWTH 3 DAYS Performed at [facility], [place] [address], [place], [place] [address]    Report Status PENDING  Incomplete    Radiology Reports Dg Chest Port 1 View  Result Date: 09/02/2019 CLINICAL DATA:  COVID-19 patient with fever. EXAM: PORTABLE CHEST 1 VIEW COMPARISON:  September 01, 2019 FINDINGS: Bilateral pulmonary infiltrates remain, similar given difference in technique in the interval. No other interval changes. IMPRESSION: Significant bilateral pulmonary infiltrates remain. Given difference in technique/penetration, there has been no definitive interval change. The findings are consistent with the patient's COVID-19 positive status. Electronically Signed   By: Dorise Bullion III M.D   On: 09/02/2019 21:01   Dg Chest Port 1 View  Result Date: 09/01/2019 CLINICAL DATA:  Shortness of breath, COVID-19 positive EXAM: PORTABLE CHEST 1 VIEW COMPARISON:   01/18/2019 FINDINGS: The heart size and mediastinal contours are within normal limits. Multifocal airspace consolidations within a predominantly peripheral distribution. No large pleural fluid collection. No pneumothorax. The visualized skeletal structures are unremarkable. IMPRESSION: Extensive multifocal airspace opacities in a predominantly peripheral distribution suggestive of multifocal viral pneumonia. Electronically Signed   By: Davina Poke M.D.   On: 09/01/2019 15:05

## 2019-09-06 NOTE — Progress Notes (Signed)
Family Update:  During rounds the patient has his wife on speaker phone and family updated on plan of care. Patient's wife was concerned that patient would start to decline because remdesivir was completed. Assured her that the patient received the full course of remdesivir and is still receiving steroids. Patient continues to improve.

## 2019-09-06 NOTE — Progress Notes (Signed)
Patient remained alert and oriented x 4 all shift. No falls or injuries this shift. No acute respiratory or cardiac episodes this shift. Patient remains on High flow O2 [place]. Patient continues to desat and have labored breathing upon exertion, particularly when standing and when ambulating. Patient currently at 8LPM. Patient has been up to chair all day. No distress seen. No complaints of pain. Currently watching TV, call bell within reach. Will continue to monitor.

## 2019-09-07 LAB — CBC WITH DIFFERENTIAL/PLATELET
Abs Immature Granulocytes: 0.98 10*3/uL — ABNORMAL HIGH (ref 0.00–0.07)
Basophils Absolute: 0.2 10*3/uL — ABNORMAL HIGH (ref 0.0–0.1)
Basophils Relative: 1 %
Eosinophils Absolute: 0 10*3/uL (ref 0.0–0.5)
Eosinophils Relative: 0 %
HCT: 51.4 % (ref 39.0–52.0)
Hemoglobin: 16.2 g/dL (ref 13.0–17.0)
Immature Granulocytes: 5 %
Lymphocytes Relative: 7 %
Lymphs Abs: 1.3 10*3/uL (ref 0.7–4.0)
MCH: 26.5 pg (ref 26.0–34.0)
MCHC: 31.5 g/dL (ref 30.0–36.0)
MCV: 84 fL (ref 80.0–100.0)
Monocytes Absolute: 0.7 10*3/uL (ref 0.1–1.0)
Monocytes Relative: 4 %
Neutro Abs: 15.7 10*3/uL — ABNORMAL HIGH (ref 1.7–7.7)
Neutrophils Relative %: 83 %
Platelets: 330 10*3/uL (ref 150–400)
RBC: 6.12 MIL/uL — ABNORMAL HIGH (ref 4.22–5.81)
RDW: 16.2 % — ABNORMAL HIGH (ref 11.5–15.5)
WBC: 18.9 10*3/uL — ABNORMAL HIGH (ref 4.0–10.5)
nRBC: 0 % (ref 0.0–0.2)

## 2019-09-07 LAB — COMPREHENSIVE METABOLIC PANEL
ALT: 54 U/L — ABNORMAL HIGH (ref 0–44)
AST: 23 U/L (ref 15–41)
Albumin: 2.9 g/dL — ABNORMAL LOW (ref 3.5–5.0)
Alkaline Phosphatase: 75 U/L (ref 38–126)
Anion gap: 13 (ref 5–15)
BUN: 39 mg/dL — ABNORMAL HIGH (ref 6–20)
CO2: 24 mmol/L (ref 22–32)
Calcium: 8.3 mg/dL — ABNORMAL LOW (ref 8.9–10.3)
Chloride: 102 mmol/L (ref 98–111)
Creatinine, Ser: 1.04 mg/dL (ref 0.61–1.24)
GFR calc Af Amer: >60 mL/min (ref >60)
GFR calc non Af Amer: >60 mL/min (ref >60)
Glucose, Bld: 114 mg/dL — ABNORMAL HIGH (ref 70–99)
Potassium: 4.6 mmol/L (ref 3.5–5.1)
Sodium: 139 mmol/L (ref 135–145)
Total Bilirubin: 0.4 mg/dL (ref 0.3–1.2)
Total Protein: 5.7 g/dL — ABNORMAL LOW (ref 6.5–8.1)

## 2019-09-07 LAB — GLUCOSE, CAPILLARY
Glucose-Capillary: 121 mg/dL — ABNORMAL HIGH (ref 70–99)
Glucose-Capillary: 112 mg/dL — ABNORMAL HIGH (ref 70–99)
Glucose-Capillary: 132 mg/dL — ABNORMAL HIGH (ref 70–99)
Glucose-Capillary: 137 mg/dL — ABNORMAL HIGH (ref 70–99)
Glucose-Capillary: 216 mg/dL — ABNORMAL HIGH (ref 70–99)

## 2019-09-07 LAB — CULTURE, BLOOD (ROUTINE X 2)
Culture: NO GROWTH
Special Requests: ADEQUATE

## 2019-09-07 LAB — BRAIN NATRIURETIC PEPTIDE: B Natriuretic Peptide: 35.2 pg/mL (ref 0.0–100.0)

## 2019-09-07 LAB — C-REACTIVE PROTEIN: CRP: 0.6 mg/dL (ref <1.0)

## 2019-09-07 LAB — MAGNESIUM: Magnesium: 2.3 mg/dL (ref 1.7–2.4)

## 2019-09-07 LAB — D-DIMER, QUANTITATIVE: D-Dimer, Quant: 0.78 ug/mL-FEU — ABNORMAL HIGH (ref 0.00–0.50)

## 2019-09-07 MED ORDER — HYDRALAZINE HCL 50 MG PO TABS
100.0000 mg | ORAL_TABLET | Freq: Three times a day (TID) | ORAL | Status: DC
  Administered 2019-09-07 – 2019-09-10 (×8): 100 mg via ORAL
  Filled 2019-09-07 (×12): qty 2

## 2019-09-07 MED ORDER — METHYLPREDNISOLONE SODIUM SUCC 40 MG IJ SOLR: 30.0000 mg | Freq: Every day | INTRAMUSCULAR | Status: DC

## 2019-09-07 MED ORDER — METHYLPREDNISOLONE SODIUM SUCC 40 MG IJ SOLR
40.0000 mg | Freq: Every day | INTRAMUSCULAR | Status: DC
  Administered 2019-09-07: 40 mg via INTRAVENOUS
  Filled 2019-09-07: qty 1

## 2019-09-07 MED ORDER — FUROSEMIDE 10 MG/ML IJ SOLN
40.0000 mg | Freq: Once | INTRAMUSCULAR | Status: AC
  Administered 2019-09-07: 40 mg via INTRAVENOUS
  Filled 2019-09-07: qty 4

## 2019-09-07 NOTE — Progress Notes (Signed)
Patients' wife updated on patient's current condition and treatment plan.

## 2019-09-07 NOTE — Progress Notes (Signed)
Physical Therapy Treatment Patient Details Name: Don [name] MRN: [id number] DOB: 1965-10-03 Today's Date: 09/07/2019    History of Present Illness 53 yo male with medical history of morbid obesity, asthma on IL-3 blocker shots twice a month, essential hypertension who was diagnosed with COVID-19 infection about 8 days ago finally presented with severe shortness of breath ongoing for 3 to 4 days.  Was diagnosed with severe acute hypoxic respiratory failure and admitted by ICU team on 09/01/2019, he was started on Decadron and remdesivir and transferred to my care on 09/02/2019.  He has arrived to the hospital and intense respiratory distress on 15 L high flow nasal cannula oxygen.    PT Comments    The patient is on 8 L HFNC, SPO2 94% at rest.Ear probe on left, changed to right due to sliding off. Patient ambulated x 70' on 8 L with SPO2 down to 83%. Recovered to 88% within 45". Patient ambulalted 2 more times but only 25' each, dyspnea 4/4, SPO2 again drops to 835 with recovery. After rest, better readings at 96% on 8 L at rest. Continue to increase activity and decrease oxygen. Patient appears less anxious with activity.   Follow Up Recommendations  Home health PT     Equipment Recommendations  None recommended by PT    Recommendations for Other Services       Precautions / Restrictions Precautions Precaution Comments: monitor sats, on HFNC try to wean down    Mobility  Bed Mobility Overal bed mobility: Independent                Transfers Overall transfer level: Independent                  Ambulation/Gait Ambulation/Gait assistance: Supervision Gait Distance (Feet): 70 Feet(25'x2) Assistive device: None       General Gait Details: gait is slow and determined, concentrates on breathing, more brisk in gait today   Stairs             Wheelchair Mobility    Modified Rankin (Stroke Patients Only)       Balance                                            Cognition Arousal/Alertness: Awake/alert                                            Exercises      General Comments        Pertinent Vitals/Pain Pain Assessment: No/denies pain    Home Living                      Prior Function            PT Goals (current goals can now be found in the care plan section) Progress towards PT goals: Progressing toward goals    Frequency    Min 3X/week      PT Plan Current plan remains appropriate    Co-evaluation              AM-PAC PT "6 Clicks" Mobility   Outcome Measure  Help needed turning from your back to your side while in a flat bed without using bedrails?: None Help needed moving from  lying on your back to sitting on the side of a flat bed without using bedrails?: None Help needed moving to and from a bed to a chair (including a wheelchair)?: None Help needed standing up from a chair using your arms (e.g., wheelchair or bedside chair)?: None Help needed to walk in hospital room?: A Little Help needed climbing 3-5 steps with a railing? : A Little 6 Click Score: 22    End of Session Equipment Utilized During Treatment: Oxygen Activity Tolerance: Patient tolerated treatment well Patient left: in bed Nurse Communication: Mobility status PT Visit Diagnosis: Difficulty in walking, not elsewhere classified (R26.2);Unsteadiness on feet (R26.81)     Time: 5366-4403 PT Time Calculation (min) (ACUTE ONLY): 43 min  Charges:  $Gait Training: 38-52 mins                     Blanchard Kelch PT Acute Rehabilitation Services Pager [phone] Office [phone]    Rada Hay 09/07/2019, 1:34 PM

## 2019-09-07 NOTE — Progress Notes (Signed)
PROGRESS NOTE                                                                                                                                                                                                             Patient Demographics:    Don [name], is a 53 y.o. male, DOB - Sep 09, 1966, POE:[id number]  Outpatient Primary MD for the patient is [name], [place], MD    LOS - 6  Admit date - 09/01/2019    Chief Complaint  Patient presents with  . Shortness of Breath       Brief Narrative  53 yo male with medical history of morbid obesity, asthma on IL-3 blocker shots twice a month, essential hypertension who was diagnosed with COVID-19 infection about 8 days ago finally presented with severe shortness of breath ongoing for 3 to 4 days.  Was diagnosed with severe acute hypoxic respiratory failure and admitted by ICU team on 09/01/2019, he was started on Decadron and remdesivir and transferred to my care on 09/02/2019.  He has arrived to the hospital and intense respiratory distress on 15 L high flow nasal cannula oxygen.   Subjective:   Patient in bed, appears comfortable, denies any headache, no fever, no chest pain or pressure, no shortness of breath , no abdominal pain. No focal weakness.   Assessment  & Plan :   1. Acute Hypoxic Resp. Failure due to Acute Covid 19 Viral Pneumonitis during the ongoing 2020 Covid 19 Pandemic - he has severe disease and presented after 4 days of symptoms, he was in the ER on day 1 under ICU team.  He had received Decadron and remdesivir 1 dose under the ICU team.  He was transferred to my care on 09/02/2019, he immediately received a dose of Actemra x 2 after appropriate consent, clinically he has shown good improvement on a daily basis, now down to 7 L nasal cannula oxygen, when he moves his oxygen requirement temporarily may increase but at rest definitely he is much better than what he came in  with.  We will continue monitoring him closely, monitor inflammatory markers which are improving as well. Tapering steroids.  Has been encouraged to sit up in chair in the daytime use I-S and flutter valve for pulmonary toiletry and if possible prone in bed at night.   SpO2: 98 %  O2 Flow Rate (L/min): 7 L/min  Recent Labs  Lab 09/01/19 1422 09/01/19 1422 09/02/19 0530 09/02/19 1015 09/03/19 0212 09/04/19 0253 09/05/19 0330 09/06/19 0323 09/07/19 0223  CRP 11.1*   < > 13.4* 12.3* 9.7* 4.2* 1.9* 0.9 0.6  DDIMER 1.11*   < > 0.99* 0.91* 0.84* 0.56* 0.61* 0.66* 0.78*  FERRITIN 553*  --  691*  --   --   --   --   --   --   BNP  --    < >  --  36.9 28.5 25.3 32.2 41.4 35.2  PROCALCITON 0.12  --   --  0.12 <0.10 0.10 <0.10 <0.10  --    < > = values in this interval not displayed.    Hepatic Function Latest Ref Rng & Units 09/07/2019 09/06/2019 09/05/2019  Total Protein 6.5 - 8.1 g/dL 5.7(L) 5.8(L) 6.3(L)  Albumin 3.5 - 5.0 g/dL 2.9(L) 2.9(L) 3.0(L)  AST 15 - 41 U/L 23 22 32  ALT 0 - 44 U/L 54(H) 55(H) 61(H)  Alk Phosphatase 38 - 126 U/L 75 76 78  Total Bilirubin 0.3 - 1.2 mg/dL 0.4 0.7 0.7     2.  Morbid obesity.  BMI of greater than 40.  Follow with PCP for weight loss..   3.  Asthma.  Supportive care no wheezing.  4.  Essential hypertension.  Blood pressure remains extremely high, he is on Norvasc, high-dose Coreg and hydralazine, have doubled hydralazine again on 09/07/2019.  5.  GERD.  On Pepcid.   6.  Eczema.  Takes IL-3 blocker at home.  Resume once discharged.     Condition - Extremely Guarded  Family Communication  :  Wife on 12/6, Son 12/6, wife 12/7, 12/8, 12/9  Code Status : Full  Diet :    Diet Order            DIET SOFT Room service appropriate? Yes; Fluid consistency: Thin  Diet effective now               Disposition Plan  :  PCU  Consults  :    Procedures  :    PUD Prophylaxis : Pepcid  DVT Prophylaxis  :  Lovenox    Lab Results   Component Value Date   PLT 330 09/07/2019    Inpatient Medications  Scheduled Meds: . amLODipine  10 mg Oral Daily  . carvedilol  12.5 mg Oral BID WC  . enoxaparin (LOVENOX) injection  80 mg Subcutaneous Q24H  . famotidine  20 mg Oral Daily  . hydrALAZINE  50 mg Oral Q8H  . influenza vac split quadrivalent PF  0.5 mL Intramuscular Tomorrow-1000  . insulin aspart  0-15 Units Subcutaneous Q4H  . methylPREDNISolone (SOLU-MEDROL) injection  40 mg Intravenous Daily  . pneumococcal 23 valent vaccine  0.5 mL Intramuscular Tomorrow-1000  . sertraline  50 mg Oral Daily  . vitamin C  500 mg Oral BID   Continuous Infusions:  PRN Meds:.albuterol, sodium chloride  Antibiotics  :    Anti-infectives (From admission, onward)   Start     Dose/Rate Route Frequency Ordered Stop   09/02/19 1000  remdesivir 100 mg in sodium chloride 0.9 % 100 mL IVPB     100 mg 200 mL/hr over 30 Minutes Intravenous Every 24 hours 09/01/19 1626 09/05/19 0900   09/01/19 1700  remdesivir 200 mg in sodium chloride 0.9% 250 mL IVPB     200 mg 580 mL/hr over 30 Minutes Intravenous Once 09/01/19  1626 09/02/19 1400       Time Spent in minutes  30   Lala Lund M.D on 09/07/2019 at 12:11 PM  To page go to [email or web address] - password [facility]  [facility] -  Office  [phone]    See all Orders from today for further details    Objective:   Vitals:   09/06/19 2335 09/06/19 2340 09/07/19 0500 09/07/19 0750  BP:   (!) 151/87 (!) 156/100  Pulse: 70 70  70  Resp: (!) 22 (!) 22  19  Temp:   (!) 97.5 F (36.4 C) 97.7 F (36.5 C)  TempSrc:   Oral Oral  SpO2: 95% 94%  98%  Weight:      Height:        Wt Readings from Last 3 Encounters:  09/03/19 (!) 158.2 kg  08/04/19 (!) 161.3 kg  06/23/19 (!) 159.2 kg     Intake/Output Summary (Last 24 hours) at 09/07/2019 1211 Last data filed at 09/07/2019 0300 Gross per 24 hour  Intake 240 ml  Output 1100 ml  Net -860 ml     Physical Exam  Awake  Alert,   No new F.N deficits, Normal affect [place].AT,PERRAL Supple Neck,No JVD, No cervical lymphadenopathy appriciated.  Symmetrical Chest wall movement, Good air movement bilaterally, CTAB RRR,No Gallops, Rubs or new Murmurs, No Parasternal Heave +ve B.Sounds, Abd Soft, No tenderness, No organomegaly appriciated, No rebound - guarding or rigidity. No Cyanosis, Clubbing or edema, No new Rash or bruise    Data Review:    CBC Recent Labs  Lab 09/03/19 0212 09/04/19 0253 09/05/19 0330 09/06/19 0323 09/07/19 0223  WBC 7.9 10.9* 14.3* 16.1* 18.9*  HGB 15.6 15.5 15.9 16.1 16.2  HCT 50.1 49.6 52.0 51.0 51.4  PLT 261 284 343 335 330  MCV 85.1 83.4 85.5 84.0 84.0  MCH 26.5 26.1 26.2 26.5 26.5  MCHC 31.1 31.3 30.6 31.6 31.5  RDW 15.8* 15.4 16.0* 15.7* 16.2*  LYMPHSABS 0.9 1.3 1.4 1.3 1.3  MONOABS 0.3 0.5 0.7 0.6 0.7  EOSABS 0.0 0.0 0.0 0.0 0.0  BASOSABS 0.0 0.1 0.0 0.2* 0.2*    Chemistries  Recent Labs  Lab 09/03/19 0212 09/04/19 0253 09/05/19 0330 09/06/19 0323 09/07/19 0223  NA 139 140 141 139 139  K 4.6 4.9 4.8 4.3 4.6  CL 98 104 101 103 102  CO2 _0 GLUCOSE 134* 137* 138* 119* 114*  BUN 43* 44* 45* 40* 39*  CREATININE 1.21 1.16 1.12 0.98 1.04  CALCIUM 8.5* 8.4* 8.4* 8.2* 8.3*  MG 2.5* 2.3 2.4 2.5* 2.3  AST 33 35 32 22 23  ALT 45* 45* 61* 55* 54*  ALKPHOS 76 77 78 76 75  BILITOT 0.4 0.7 0.7 0.7 0.4   ------------------------------------------------------------------------------------------------------------------ No results for input(s): CHOL, HDL, LDLCALC, TRIG, CHOLHDL, LDLDIRECT in the last 72 hours.  Lab Results  Component Value Date   HGBA1C 6.6 (H) 09/02/2019   ------------------------------------------------------------------------------------------------------------------ No results for input(s): TSH, T4TOTAL, T3FREE, THYROIDAB in the last 72 hours.  Invalid input(s): FREET3  Cardiac Enzymes No results for input(s): CKMB, TROPONINI,  MYOGLOBIN in the last 168 hours.  Invalid input(s): CK ------------------------------------------------------------------------------------------------------------------    Component Value Date/Time   BNP 35.2 09/07/2019 0223   BNP 18.7 09/22/2016 1614    Micro Results Recent Results (from the past 240 hour(s))  Blood Culture (routine x 2)     Status: None   Collection Time: 09/01/19  2:22 PM   Specimen: BLOOD  Result Value Ref Range Status   Specimen Description BLOOD LEFT ANTECUBITAL  Final   Special Requests   Final    BOTTLES DRAWN AEROBIC AND ANAEROBIC Blood Culture adequate volume   Culture   Final    NO GROWTH 5 DAYS Performed at [facility], [address], [place], [place] [address]    Report Status 09/06/2019 FINAL  Final  Blood Culture (routine x 2)     Status: None   Collection Time: 09/01/19 11:30 PM   Specimen: BLOOD  Result Value Ref Range Status   Specimen Description BLOOD RIGHT ARM  Final   Special Requests   Final    BOTTLES DRAWN AEROBIC AND ANAEROBIC Blood Culture adequate volume   Culture   Final    NO GROWTH 5 DAYS Performed at [facility], [place] [address], [place], [place] [address]    Report Status 09/07/2019 FINAL  Final    Radiology Reports DG CHEST PORT 1 VIEW  Result Date: 09/02/2019 CLINICAL DATA:  COVID-19 patient with fever. EXAM: PORTABLE CHEST 1 VIEW COMPARISON:  September 01, 2019 FINDINGS: Bilateral pulmonary infiltrates remain, similar given difference in technique in the interval. No other interval changes. IMPRESSION: Significant bilateral pulmonary infiltrates remain. Given difference in technique/penetration, there has been no definitive interval change. The findings are consistent with the patient's COVID-19 positive status. Electronically Signed   By: Dorise Bullion III M.D   On: 09/02/2019 21:01   DG Chest Port 1 View  Result Date: 09/01/2019 CLINICAL DATA:  Shortness of breath, COVID-19 positive EXAM: PORTABLE CHEST  1 VIEW COMPARISON:  01/18/2019 FINDINGS: The heart size and mediastinal contours are within normal limits. Multifocal airspace consolidations within a predominantly peripheral distribution. No large pleural fluid collection. No pneumothorax. The visualized skeletal structures are unremarkable. IMPRESSION: Extensive multifocal airspace opacities in a predominantly peripheral distribution suggestive of multifocal viral pneumonia. Electronically Signed   By: Davina Poke M.D.   On: 09/01/2019 15:05

## 2019-09-07 NOTE — Progress Notes (Signed)
Patient remained alert and oriented x 4 all shift. No acute cardiac or respiratory issues this shift. Patient is currently on 5 LPM HFNC with sats in the high 90's-100%. Patient was on 8 LPM at the beginning of the shift. No falls or injuries  This shift. No complaints of pain this shift. Patient currently sitting up in chair watching TV. No distress seen. Call bell within reach. Will continue to monitor.

## 2019-09-08 ENCOUNTER — Telehealth: Payer: 59 | Admitting: Adult Health

## 2019-09-08 ENCOUNTER — Ambulatory Visit: Payer: 59 | Admitting: Pulmonary Disease

## 2019-09-08 LAB — TSH: TSH: 0.792 u[IU]/mL (ref 0.350–4.500)

## 2019-09-08 LAB — CBC WITH DIFFERENTIAL/PLATELET
Abs Immature Granulocytes: 1.06 10*3/uL — ABNORMAL HIGH (ref 0.00–0.07)
Basophils Absolute: 0 10*3/uL (ref 0.0–0.1)
Basophils Relative: 0 %
Eosinophils Absolute: 0 10*3/uL (ref 0.0–0.5)
Eosinophils Relative: 0 %
HCT: 51.9 % (ref 39.0–52.0)
Hemoglobin: 16.2 g/dL (ref 13.0–17.0)
Immature Granulocytes: 6 %
Lymphocytes Relative: 7 %
Lymphs Abs: 1.3 10*3/uL (ref 0.7–4.0)
MCH: 26.3 pg (ref 26.0–34.0)
MCHC: 31.2 g/dL (ref 30.0–36.0)
MCV: 84.4 fL (ref 80.0–100.0)
Monocytes Absolute: 1 10*3/uL (ref 0.1–1.0)
Monocytes Relative: 5 %
Neutro Abs: 15.6 10*3/uL — ABNORMAL HIGH (ref 1.7–7.7)
Neutrophils Relative %: 82 %
Platelets: 374 10*3/uL (ref 150–400)
RBC: 6.15 MIL/uL — ABNORMAL HIGH (ref 4.22–5.81)
RDW: 16.4 % — ABNORMAL HIGH (ref 11.5–15.5)
WBC: 19 10*3/uL — ABNORMAL HIGH (ref 4.0–10.5)
nRBC: 0 % (ref 0.0–0.2)

## 2019-09-08 LAB — COMPREHENSIVE METABOLIC PANEL
ALT: 57 U/L — ABNORMAL HIGH (ref 0–44)
AST: 22 U/L (ref 15–41)
Albumin: 2.9 g/dL — ABNORMAL LOW (ref 3.5–5.0)
Alkaline Phosphatase: 78 U/L (ref 38–126)
Anion gap: 12 (ref 5–15)
BUN: 39 mg/dL — ABNORMAL HIGH (ref 6–20)
CO2: 26 mmol/L (ref 22–32)
Calcium: 8.3 mg/dL — ABNORMAL LOW (ref 8.9–10.3)
Chloride: 102 mmol/L (ref 98–111)
Creatinine, Ser: 1.16 mg/dL (ref 0.61–1.24)
GFR calc Af Amer: >60 mL/min (ref >60)
GFR calc non Af Amer: >60 mL/min (ref >60)
Glucose, Bld: 123 mg/dL — ABNORMAL HIGH (ref 70–99)
Potassium: 4 mmol/L (ref 3.5–5.1)
Sodium: 140 mmol/L (ref 135–145)
Total Bilirubin: 1 mg/dL (ref 0.3–1.2)
Total Protein: 5.7 g/dL — ABNORMAL LOW (ref 6.5–8.1)

## 2019-09-08 LAB — GLUCOSE, CAPILLARY
Glucose-Capillary: 104 mg/dL — ABNORMAL HIGH (ref 70–99)
Glucose-Capillary: 127 mg/dL — ABNORMAL HIGH (ref 70–99)
Glucose-Capillary: 130 mg/dL — ABNORMAL HIGH (ref 70–99)
Glucose-Capillary: 137 mg/dL — ABNORMAL HIGH (ref 70–99)
Glucose-Capillary: 78 mg/dL (ref 70–99)
Glucose-Capillary: 87 mg/dL (ref 70–99)

## 2019-09-08 LAB — BRAIN NATRIURETIC PEPTIDE: B Natriuretic Peptide: 29.1 pg/mL (ref 0.0–100.0)

## 2019-09-08 LAB — C-REACTIVE PROTEIN: CRP: 0.8 mg/dL (ref <1.0)

## 2019-09-08 LAB — MAGNESIUM: Magnesium: 2.4 mg/dL (ref 1.7–2.4)

## 2019-09-08 LAB — D-DIMER, QUANTITATIVE: D-Dimer, Quant: 0.86 ug/mL-FEU — ABNORMAL HIGH (ref 0.00–0.50)

## 2019-09-08 MED ORDER — METHYLPREDNISOLONE SODIUM SUCC 40 MG IJ SOLR
20.0000 mg | Freq: Every day | INTRAMUSCULAR | Status: DC
  Administered 2019-09-08: 09:00:00 20 mg via INTRAVENOUS
  Filled 2019-09-08: qty 1

## 2019-09-08 MED ORDER — INSULIN ASPART 100 UNIT/ML ~~LOC~~ SOLN
0.0000 [IU] | Freq: Three times a day (TID) | SUBCUTANEOUS | Status: DC
  Administered 2019-09-09: 3 [IU] via SUBCUTANEOUS

## 2019-09-08 MED ORDER — IRBESARTAN 150 MG PO TABS
150.0000 mg | ORAL_TABLET | Freq: Every day | ORAL | Status: DC
  Administered 2019-09-08 – 2019-09-10 (×3): 150 mg via ORAL
  Filled 2019-09-08 (×3): qty 1

## 2019-09-08 MED ORDER — FUROSEMIDE 20 MG PO TABS: 40.0000 mg | ORAL_TABLET | Freq: Once | ORAL | Status: DC

## 2019-09-08 NOTE — Progress Notes (Signed)
Pt wife updated and all questions answered.  

## 2019-09-08 NOTE — Progress Notes (Addendum)
PROGRESS NOTE                                                                                                                                                                                                             Patient Demographics:    Don [name], is a 53 y.o. male, DOB - 1965/10/17, MVH:[id number]  Outpatient Primary MD for the patient is [name], [place], MD    LOS - 7  Admit date - 09/01/2019    Chief Complaint  Patient presents with  . Shortness of Breath       Brief Narrative  53 yo male with medical history of morbid obesity, asthma on IL-3 blocker shots twice a month, essential hypertension who was diagnosed with COVID-19 infection about 8 days ago finally presented with severe shortness of breath ongoing for 3 to 4 days.  Was diagnosed with severe acute hypoxic respiratory failure and admitted by ICU team on 09/01/2019, he was started on Decadron and remdesivir and transferred to my care on 09/02/2019.  He has arrived to the hospital and intense respiratory distress on 15 L high flow nasal cannula oxygen.   Subjective:   Patient in bed, appears comfortable, denies any headache, no fever, no chest pain or pressure, improved shortness of breath , no abdominal pain. No focal weakness.    Assessment  & Plan :   1. Acute Hypoxic Resp. Failure due to Acute Covid 19 Viral Pneumonitis during the ongoing 2020 Covid 19 Pandemic - he has severe disease and presented after 4 days of symptoms, he was in the ER on day 1 under ICU team.  He had received Decadron and remdesivir 1 dose under the ICU team.  He was transferred to my care on 09/02/2019, he immediately received a dose of Actemra x 2 after appropriate consent, clinically he has shown good improvement on a daily basis, now down to 3 L nasal cannula oxygen, when he moves his oxygen requirement temporarily may increase but at rest definitely he is much better than what he  came in with.  We will continue monitoring him closely, monitor inflammatory markers which are improving as well. Tapering steroids.  Has been encouraged to sit up in chair in the daytime use I-S and flutter valve for pulmonary toiletry and if possible prone in bed at night.   SpO2: 98 %  O2 Flow Rate (L/min): 3 L/min  Recent Labs  Lab 09/01/19 1422 09/01/19 1422 09/02/19 0530 09/02/19 1015 09/03/19 0212 09/04/19 0253 09/05/19 0330 09/06/19 0323 09/07/19 0223 09/08/19 0054  CRP 11.1*   < > 13.4* 12.3* 9.7* 4.2* 1.9* 0.9 0.6 0.8  DDIMER 1.11*   < > 0.99* 0.91* 0.84* 0.56* 0.61* 0.66* 0.78* 0.86*  FERRITIN 553*  --  691*  --   --   --   --   --   --   --   BNP  --    < >  --  36.9 28.5 25.3 32.2 41.4 35.2 29.1  PROCALCITON 0.12  --   --  0.12 <0.10 0.10 <0.10 <0.10  --   --    < > = values in this interval not displayed.    Hepatic Function Latest Ref Rng & Units 09/08/2019 09/07/2019 09/06/2019  Total Protein 6.5 - 8.1 g/dL 5.7(L) 5.7(L) 5.8(L)  Albumin 3.5 - 5.0 g/dL 2.9(L) 2.9(L) 2.9(L)  AST 15 - 41 U/L _0 ALT 0 - 44 U/L 57(H) 54(H) 55(H)  Alk Phosphatase 38 - 126 U/L 78 75 76  Total Bilirubin 0.3 - 1.2 mg/dL 1.0 0.4 0.7     2.  Morbid obesity.  BMI of greater than 40.  Follow with PCP for weight loss..   3.  Asthma.  Supportive care no wheezing.  4.  Essential hypertension.  Blood pressure remains extremely high, he is on Norvasc, high-dose Coreg and hydralazine, have doubled hydralazine again on 09/07/2019.  5.  GERD.  On Pepcid.   6.  Eczema.  Takes IL-3 blocker at home.  Resume once discharged.   7.  OSA.  Noncompliant with CPAP.  Counseled.  Has a pulmonary physician Dr. Elsworth Soho, recommended to follow with him post discharge.  8.  Sinus bradycardia with possibility of type II heart block.  Likely due to OSA, this happened while he was sleeping and oxygen has slipped off, upon waking up heart rate stable, stop beta-blocker, check TSH, check EKG, will also  discuss with cardiology, cardiology has been paged. DW Dr turner agrees with the plan.     Condition - Extremely Guarded  Family Communication  :  Wife on 12/6, Son 12/6, wife 12/7, 12/8, 12/9, 12/10. 12/11  Code Status : Full  Diet :    Diet Order            DIET SOFT Room service appropriate? Yes; Fluid consistency: Thin  Diet effective now               Disposition Plan  :  DC in am if better  Consults  :  None  Procedures  :    PUD Prophylaxis : Pepcid  DVT Prophylaxis  :  Lovenox    Lab Results  Component Value Date   PLT 374 09/08/2019    Inpatient Medications  Scheduled Meds: . amLODipine  10 mg Oral Daily  . carvedilol  12.5 mg Oral BID WC  . enoxaparin (LOVENOX) injection  80 mg Subcutaneous Q24H  . famotidine  20 mg Oral Daily  . hydrALAZINE  100 mg Oral Q8H  . influenza vac split quadrivalent PF  0.5 mL Intramuscular Tomorrow-1000  . insulin aspart  0-15 Units Subcutaneous Q4H  . methylPREDNISolone (SOLU-MEDROL) injection  20 mg Intravenous Daily  . pneumococcal 23 valent vaccine  0.5 mL Intramuscular Tomorrow-1000  . sertraline  50 mg Oral Daily  . vitamin C  500 mg  Oral BID   Continuous Infusions:  PRN Meds:.albuterol, sodium chloride  Antibiotics  :    Anti-infectives (From admission, onward)   Start     Dose/Rate Route Frequency Ordered Stop   09/02/19 1000  remdesivir 100 mg in sodium chloride 0.9 % 100 mL IVPB     100 mg 200 mL/hr over 30 Minutes Intravenous Every 24 hours 09/01/19 1626 09/05/19 0900   09/01/19 1700  remdesivir 200 mg in sodium chloride 0.9% 250 mL IVPB     200 mg 580 mL/hr over 30 Minutes Intravenous Once 09/01/19 1626 09/02/19 1400       Time Spent in minutes  30   Lala Lund M.D on 09/08/2019 at 10:02 AM  To page go to [email or web address] - password [facility]  [facility] -  Office  [phone]    See all Orders from today for further details    Objective:   Vitals:   09/08/19 0000 09/08/19 0421  09/08/19 0804 09/08/19 0831  BP: (!) 142/87 (!) 146/90    Pulse:  64 68 65  Resp: (!) 24 20    Temp: (!) 97.4 F (36.3 C) 97.6 F (36.4 C) 97.8 F (36.6 C)   TempSrc: Oral Oral Axillary   SpO2: 93% 97% 98% 98%  Weight:      Height:        Wt Readings from Last 3 Encounters:  09/03/19 (!) 158.2 kg  08/04/19 (!) 161.3 kg  06/23/19 (!) 159.2 kg     Intake/Output Summary (Last 24 hours) at 09/08/2019 1002 Last data filed at 09/08/2019 0858 Gross per 24 hour  Intake 716 ml  Output 1425 ml  Net -709 ml     Physical Exam  Awake Alert,   No new F.N deficits, Normal affect .AT,PERRAL Supple Neck,No JVD, No cervical lymphadenopathy appriciated.  Symmetrical Chest wall movement, Good air movement bilaterally, CTAB RRR,No Gallops, Rubs or new Murmurs, No Parasternal Heave +ve B.Sounds, Abd Soft, No tenderness, No organomegaly appriciated, No rebound - guarding or rigidity. No Cyanosis, Clubbing or edema, No new Rash or bruise    Data Review:    CBC Recent Labs  Lab 09/04/19 0253 09/05/19 0330 09/06/19 0323 09/07/19 0223 09/08/19 0054  WBC 10.9* 14.3* 16.1* 18.9* 19.0*  HGB 15.5 15.9 16.1 16.2 16.2  HCT 49.6 52.0 51.0 51.4 51.9  PLT 284 343 335 330 374  MCV 83.4 85.5 84.0 84.0 84.4  MCH 26.1 26.2 26.5 26.5 26.3  MCHC 31.3 30.6 31.6 31.5 31.2  RDW 15.4 16.0* 15.7* 16.2* 16.4*  LYMPHSABS 1.3 1.4 1.3 1.3 1.3  MONOABS 0.5 0.7 0.6 0.7 1.0  EOSABS 0.0 0.0 0.0 0.0 0.0  BASOSABS 0.1 0.0 0.2* 0.2* 0.0    Chemistries  Recent Labs  Lab 09/04/19 0253 09/05/19 0330 09/06/19 0323 09/07/19 0223 09/08/19 0054  NA 140 141 139 139 140  K 4.9 4.8 4.3 4.6 4.0  CL 104 101 103 102 102  CO2 _0 GLUCOSE 137* 138* 119* 114* 123*  BUN 44* 45* 40* 39* 39*  CREATININE 1.16 1.12 0.98 1.04 1.16  CALCIUM 8.4* 8.4* 8.2* 8.3* 8.3*  MG 2.3 2.4 2.5* 2.3 2.4  AST 35 32 _1 ALT 45* 61* 55* 54* 57*  ALKPHOS 77 78 76 75 78  BILITOT 0.7 0.7 0.7 0.4 1.0    ------------------------------------------------------------------------------------------------------------------ No results for input(s): CHOL, HDL, LDLCALC, TRIG, CHOLHDL, LDLDIRECT in the last 72 hours.  Lab Results  Component Value Date  HGBA1C 6.6 (H) 09/02/2019   ------------------------------------------------------------------------------------------------------------------ No results for input(s): TSH, T4TOTAL, T3FREE, THYROIDAB in the last 72 hours.  Invalid input(s): FREET3  Cardiac Enzymes No results for input(s): CKMB, TROPONINI, MYOGLOBIN in the last 168 hours.  Invalid input(s): CK ------------------------------------------------------------------------------------------------------------------    Component Value Date/Time   BNP 29.1 09/08/2019 0054   BNP 18.7 09/22/2016 1614    Micro Results Recent Results (from the past 240 hour(s))  Blood Culture (routine x 2)     Status: None   Collection Time: 09/01/19  2:22 PM   Specimen: BLOOD  Result Value Ref Range Status   Specimen Description BLOOD LEFT ANTECUBITAL  Final   Special Requests   Final    BOTTLES DRAWN AEROBIC AND ANAEROBIC Blood Culture adequate volume   Culture   Final    NO GROWTH 5 DAYS Performed at [facility], [address] [place], [place] [address]    Report Status 09/06/2019 FINAL  Final  Blood Culture (routine x 2)     Status: None   Collection Time: 09/01/19 11:30 PM   Specimen: BLOOD  Result Value Ref Range Status   Specimen Description BLOOD RIGHT ARM  Final   Special Requests   Final    BOTTLES DRAWN AEROBIC AND ANAEROBIC Blood Culture adequate volume   Culture   Final    NO GROWTH 5 DAYS Performed at [facility], [place] [address] [place], [place] [address]    Report Status 09/07/2019 FINAL  Final    Radiology Reports DG CHEST PORT 1 VIEW  Result Date: 09/02/2019 CLINICAL DATA:  COVID-19 patient with fever. EXAM: PORTABLE CHEST 1 VIEW COMPARISON:  September 01, 2019 FINDINGS: Bilateral pulmonary infiltrates remain, similar given difference in technique in the interval. No other interval changes. IMPRESSION: Significant bilateral pulmonary infiltrates remain. Given difference in technique/penetration, there has been no definitive interval change. The findings are consistent with the patient's COVID-19 positive status. Electronically Signed   By: Dorise Bullion III M.D   On: 09/02/2019 21:01   DG Chest Port 1 View  Result Date: 09/01/2019 CLINICAL DATA:  Shortness of breath, COVID-19 positive EXAM: PORTABLE CHEST 1 VIEW COMPARISON:  01/18/2019 FINDINGS: The heart size and mediastinal contours are within normal limits. Multifocal airspace consolidations within a predominantly peripheral distribution. No large pleural fluid collection. No pneumothorax. The visualized skeletal structures are unremarkable. IMPRESSION: Extensive multifocal airspace opacities in a predominantly peripheral distribution suggestive of multifocal viral pneumonia. Electronically Signed   By: Davina Poke M.D.   On: 09/01/2019 15:05

## 2019-09-08 NOTE — Progress Notes (Signed)
Probe on right earlobe loose and half off, replaced 02 sat 91.

## 2019-09-09 LAB — CBC WITH DIFFERENTIAL/PLATELET
Abs Immature Granulocytes: 0.9 10*3/uL — ABNORMAL HIGH (ref 0.00–0.07)
Basophils Absolute: 0 10*3/uL (ref 0.0–0.1)
Basophils Relative: 0 %
Eosinophils Absolute: 0.6 10*3/uL — ABNORMAL HIGH (ref 0.0–0.5)
Eosinophils Relative: 4 %
HCT: 54.3 % — ABNORMAL HIGH (ref 39.0–52.0)
Hemoglobin: 17.1 g/dL — ABNORMAL HIGH (ref 13.0–17.0)
Immature Granulocytes: 5 %
Lymphocytes Relative: 13 %
Lymphs Abs: 2.4 10*3/uL (ref 0.7–4.0)
MCH: 26.6 pg (ref 26.0–34.0)
MCHC: 31.5 g/dL (ref 30.0–36.0)
MCV: 84.6 fL (ref 80.0–100.0)
Monocytes Absolute: 1.3 10*3/uL — ABNORMAL HIGH (ref 0.1–1.0)
Monocytes Relative: 7 %
Neutro Abs: 12.4 10*3/uL — ABNORMAL HIGH (ref 1.7–7.7)
Neutrophils Relative %: 71 %
Platelets: 326 10*3/uL (ref 150–400)
RBC: 6.42 MIL/uL — ABNORMAL HIGH (ref 4.22–5.81)
RDW: 17.2 % — ABNORMAL HIGH (ref 11.5–15.5)
WBC: 17.6 10*3/uL — ABNORMAL HIGH (ref 4.0–10.5)
nRBC: 0 % (ref 0.0–0.2)

## 2019-09-09 LAB — GLUCOSE, CAPILLARY
Glucose-Capillary: 94 mg/dL (ref 70–99)
Glucose-Capillary: 199 mg/dL — ABNORMAL HIGH (ref 70–99)
Glucose-Capillary: 106 mg/dL — ABNORMAL HIGH (ref 70–99)

## 2019-09-09 MED ORDER — METHYLPREDNISOLONE SODIUM SUCC 40 MG IJ SOLR
10.0000 mg | Freq: Every day | INTRAMUSCULAR | Status: DC
  Administered 2019-09-09: 09:00:00 10 mg via INTRAVENOUS
  Filled 2019-09-09: qty 1

## 2019-09-09 NOTE — Progress Notes (Signed)
PROGRESS NOTE                                                                                                                                                                                                             Patient Demographics:    Don [name], is a 53 y.o. male, DOB - June 24, 1966, VEZ:[id number]  Outpatient Primary MD for the patient is [name], [place], MD    LOS - 8  Admit date - 09/01/2019    Chief Complaint  Patient presents with  . Shortness of Breath       Brief Narrative  53 yo male with medical history of morbid obesity, asthma on IL-3 blocker shots twice a month, essential hypertension who was diagnosed with COVID-19 infection about 8 days ago finally presented with severe shortness of breath ongoing for 3 to 4 days.  Was diagnosed with severe acute hypoxic respiratory failure and admitted by ICU team on 09/01/2019, he was started on Decadron and remdesivir and transferred to my care on 09/02/2019.  He has arrived to the hospital and intense respiratory distress on 15 L high flow nasal cannula oxygen.   Subjective:   Patient in bed, appears comfortable, denies any headache, no fever, no chest pain or pressure, no shortness of breath , no abdominal pain. No focal weakness.   Assessment  & Plan :   1. Acute Hypoxic Resp. Failure due to Acute Covid 19 Viral Pneumonitis during the ongoing 2020 Covid 19 Pandemic - he has severe disease and presented after 4 days of symptoms, he was in the ER on day 1 under ICU team.  He had received Decadron and remdesivir 1 dose under the ICU team.  He was transferred to my care on 09/02/2019, he immediately received a dose of Actemra x 2 after appropriate consent, clinically he has shown good improvement on a daily basis, now down to 2-3 L nasal cannula oxygen, when he moves his oxygen requirement temporarily may increase but at rest definitely he is much better than what he came  in with.  Will monitor 1 more day, taper of steroids and prepare for discharge with home oxygen on 09/10/2019.  Patient is extremely deconditioned at baseline and even prior to coming to the hospital was getting short of breath on minimal activity, also has severe obstructive sleep apnea which he knows  about and has been told to wear CPAP in the past.  Patient was advised to go on a diet and exercise program post discharge and to follow with his pulmonologist Dr. Elsworth Soho soon after hospital discharge to get a CPAP machine.  Has been encouraged to sit up in chair in the daytime use I-S and flutter valve for pulmonary toiletry and if possible prone in bed at night.   SpO2: (!) 73 % O2 Flow Rate (L/min): 3 L/min  Recent Labs  Lab 09/02/19 1015 09/03/19 0212 09/04/19 0253 09/05/19 0330 09/06/19 0323 09/07/19 0223 09/08/19 0054  CRP 12.3* 9.7* 4.2* 1.9* 0.9 0.6 0.8  DDIMER 0.91* 0.84* 0.56* 0.61* 0.66* 0.78* 0.86*  BNP 36.9 28.5 25.3 32.2 41.4 35.2 29.1  PROCALCITON 0.12 <0.10 0.10 <0.10 <0.10  --   --     Hepatic Function Latest Ref Rng & Units 09/08/2019 09/07/2019 09/06/2019  Total Protein 6.5 - 8.1 g/dL 5.7(L) 5.7(L) 5.8(L)  Albumin 3.5 - 5.0 g/dL 2.9(L) 2.9(L) 2.9(L)  AST 15 - 41 U/L _0 ALT 0 - 44 U/L 57(H) 54(H) 55(H)  Alk Phosphatase 38 - 126 U/L 78 75 76  Total Bilirubin 0.3 - 1.2 mg/dL 1.0 0.4 0.7     2.  Morbid obesity.  BMI of greater than 40.  Follow with PCP for weight loss..   3.  Asthma.  Supportive care no wheezing.  4.  Essential hypertension.  Blood pressure remains extremely high, he is on Norvasc, ARB and hydralazine, monitor and adjust.  5.  GERD.  On Pepcid.   6.  Eczema.  Takes IL-3 blocker at home.  Resume once discharged.   7.  OSA.  Noncompliant with CPAP.  Counseled.  Has a pulmonary physician Dr. Elsworth Soho, recommended to follow with him post discharge.  8.  Sinus bradycardia with possibility of type II heart block.  Likely due to OSA, this  happened while he was sleeping and oxygen has slipped off, upon waking up heart rate stable, stop beta-blocker, stable TSH & EKG, will also discussed with neurologist Dr. Golden Hurter who agreed with the plan on 09/08/2019.  Lab Results  Component Value Date   TSH 0.792 09/08/2019     Condition - Extremely Guarded  Family Communication  :  Wife on 12/6, Son 12/6, wife 12/7, 12/8, 12/9, 12/10. 12/11, 12/12  Code Status : Full  Diet :    Diet Order            DIET SOFT Room service appropriate? Yes; Fluid consistency: Thin  Diet effective now               Disposition Plan  :  DC in am if better  Consults  : Dr. Golden Hurter cardiologist over the phone on 09/08/2019  Procedures  :    PUD Prophylaxis : Pepcid  DVT Prophylaxis  :  Lovenox    Lab Results  Component Value Date   PLT 326 09/09/2019    Inpatient Medications  Scheduled Meds: . amLODipine  10 mg Oral Daily  . enoxaparin (LOVENOX) injection  80 mg Subcutaneous Q24H  . famotidine  20 mg Oral Daily  . hydrALAZINE  100 mg Oral Q8H  . influenza vac split quadrivalent PF  0.5 mL Intramuscular Tomorrow-1000  . insulin aspart  0-15 Units Subcutaneous TID AC & HS  . irbesartan  150 mg Oral Daily  . methylPREDNISolone (SOLU-MEDROL) injection  10 mg Intravenous Daily  . pneumococcal 23 valent vaccine  0.5 mL Intramuscular Tomorrow-1000  . sertraline  50 mg Oral Daily  . vitamin C  500 mg Oral BID   Continuous Infusions:  PRN Meds:.albuterol, sodium chloride  Antibiotics  :    Anti-infectives (From admission, onward)   Start     Dose/Rate Route Frequency Ordered Stop   09/02/19 1000  remdesivir 100 mg in sodium chloride 0.9 % 100 mL IVPB     100 mg 200 mL/hr over 30 Minutes Intravenous Every 24 hours 09/01/19 1626 09/05/19 0900   09/01/19 1700  remdesivir 200 mg in sodium chloride 0.9% 250 mL IVPB     200 mg 580 mL/hr over 30 Minutes Intravenous Once 09/01/19 1626 09/02/19 1400       Time Spent in  minutes  30   Lala Lund M.D on 09/09/2019 at 9:42 AM  To page go to [email or web address] - password [facility]  [facility] -  Office  [phone]    See all Orders from today for further details    Objective:   Vitals:   09/08/19 2000 09/08/19 2342 09/09/19 0443 09/09/19 0715  BP: 124/82  (!) 141/95   Pulse:      Resp: (!) 22     Temp: (!) 97.3 F (36.3 C) (!) 97.5 F (36.4 C) (!) 97.3 F (36.3 C) 97.7 F (36.5 C)  TempSrc: Oral Oral Oral Oral  SpO2:      Weight:      Height:        Wt Readings from Last 3 Encounters:  09/03/19 (!) 158.2 kg  08/04/19 (!) 161.3 kg  06/23/19 (!) 159.2 kg     Intake/Output Summary (Last 24 hours) at 09/09/2019 0942 Last data filed at 09/09/2019 0839 Gross per 24 hour  Intake 1076 ml  Output 2425 ml  Net -1349 ml     Physical Exam  Awake Alert,  No new F.N deficits, Normal affect [place].AT,PERRAL Supple Neck,No JVD, No cervical lymphadenopathy appriciated.  Symmetrical Chest wall movement, Good air movement bilaterally, CTAB RRR,No Gallops, Rubs or new Murmurs, No Parasternal Heave +ve B.Sounds, Abd Soft, No tenderness, No organomegaly appriciated, No rebound - guarding or rigidity. No Cyanosis, Clubbing or edema, No new Rash or bruise     Data Review:    CBC Recent Labs  Lab 09/05/19 0330 09/06/19 0323 09/07/19 0223 09/08/19 0054 09/09/19 0209  WBC 14.3* 16.1* 18.9* 19.0* 17.6*  HGB 15.9 16.1 16.2 16.2 17.1*  HCT 52.0 51.0 51.4 51.9 54.3*  PLT 343 335 330 374 326  MCV 85.5 84.0 84.0 84.4 84.6  MCH 26.2 26.5 26.5 26.3 26.6  MCHC 30.6 31.6 31.5 31.2 31.5  RDW 16.0* 15.7* 16.2* 16.4* 17.2*  LYMPHSABS 1.4 1.3 1.3 1.3 2.4  MONOABS 0.7 0.6 0.7 1.0 1.3*  EOSABS 0.0 0.0 0.0 0.0 0.6*  BASOSABS 0.0 0.2* 0.2* 0.0 0.0    Chemistries  Recent Labs  Lab 09/04/19 0253 09/05/19 0330 09/06/19 0323 09/07/19 0223 09/08/19 0054  NA 140 141 139 139 140  K 4.9 4.8 4.3 4.6 4.0  CL 104 101 103 102 102  CO2 _0 GLUCOSE 137* 138* 119* 114* 123*  BUN 44* 45* 40* 39* 39*  CREATININE 1.16 1.12 0.98 1.04 1.16  CALCIUM 8.4* 8.4* 8.2* 8.3* 8.3*  MG 2.3 2.4 2.5* 2.3 2.4  AST 35 32 _1 ALT 45* 61* 55* 54* 57*  ALKPHOS 77 78 76 75 78  BILITOT 0.7 0.7 0.7 0.4 1.0   ------------------------------------------------------------------------------------------------------------------  No results for input(s): CHOL, HDL, LDLCALC, TRIG, CHOLHDL, LDLDIRECT in the last 72 hours.  Lab Results  Component Value Date   HGBA1C 6.6 (H) 09/02/2019   ------------------------------------------------------------------------------------------------------------------ Recent Labs    09/08/19 0054  TSH 0.792    Cardiac Enzymes No results for input(s): CKMB, TROPONINI, MYOGLOBIN in the last 168 hours.  Invalid input(s): CK ------------------------------------------------------------------------------------------------------------------    Component Value Date/Time   BNP 29.1 09/08/2019 0054   BNP 18.7 09/22/2016 1614    Micro Results Recent Results (from the past 240 hour(s))  Blood Culture (routine x 2)     Status: None   Collection Time: 09/01/19  2:22 PM   Specimen: BLOOD  Result Value Ref Range Status   Specimen Description BLOOD LEFT ANTECUBITAL  Final   Special Requests   Final    BOTTLES DRAWN AEROBIC AND ANAEROBIC Blood Culture adequate volume   Culture   Final    NO GROWTH 5 DAYS Performed at [facility], [address], [place], [place] [address]    Report Status 09/06/2019 FINAL  Final  Blood Culture (routine x 2)     Status: None   Collection Time: 09/01/19 11:30 PM   Specimen: BLOOD  Result Value Ref Range Status   Specimen Description BLOOD RIGHT ARM  Final   Special Requests   Final    BOTTLES DRAWN AEROBIC AND ANAEROBIC Blood Culture adequate volume   Culture   Final    NO GROWTH 5 DAYS Performed at [facility], [place] [address], [place], [place] [address]     Report Status 09/07/2019 FINAL  Final    Radiology Reports DG CHEST PORT 1 VIEW  Result Date: 09/02/2019 CLINICAL DATA:  COVID-19 patient with fever. EXAM: PORTABLE CHEST 1 VIEW COMPARISON:  September 01, 2019 FINDINGS: Bilateral pulmonary infiltrates remain, similar given difference in technique in the interval. No other interval changes. IMPRESSION: Significant bilateral pulmonary infiltrates remain. Given difference in technique/penetration, there has been no definitive interval change. The findings are consistent with the patient's COVID-19 positive status. Electronically Signed   By: Dorise Bullion III M.D   On: 09/02/2019 21:01   DG Chest Port 1 View  Result Date: 09/01/2019 CLINICAL DATA:  Shortness of breath, COVID-19 positive EXAM: PORTABLE CHEST 1 VIEW COMPARISON:  01/18/2019 FINDINGS: The heart size and mediastinal contours are within normal limits. Multifocal airspace consolidations within a predominantly peripheral distribution. No large pleural fluid collection. No pneumothorax. The visualized skeletal structures are unremarkable. IMPRESSION: Extensive multifocal airspace opacities in a predominantly peripheral distribution suggestive of multifocal viral pneumonia. Electronically Signed   By: Davina Poke M.D.   On: 09/01/2019 15:05

## 2019-09-10 DIAGNOSIS — J1289 Other viral pneumonia: Secondary | ICD-10-CM

## 2019-09-10 LAB — CBC WITH DIFFERENTIAL/PLATELET
Abs Immature Granulocytes: 0.6 10*3/uL — ABNORMAL HIGH (ref 0.00–0.07)
Basophils Absolute: 0 10*3/uL (ref 0.0–0.1)
Basophils Relative: 0 %
Eosinophils Absolute: 0.9 10*3/uL — ABNORMAL HIGH (ref 0.0–0.5)
Eosinophils Relative: 6 %
HCT: 55.3 % — ABNORMAL HIGH (ref 39.0–52.0)
Hemoglobin: 17.2 g/dL — ABNORMAL HIGH (ref 13.0–17.0)
Immature Granulocytes: 4 %
Lymphocytes Relative: 15 %
Lymphs Abs: 2.3 10*3/uL (ref 0.7–4.0)
MCH: 25.9 pg — ABNORMAL LOW (ref 26.0–34.0)
MCHC: 31.1 g/dL (ref 30.0–36.0)
MCV: 83.4 fL (ref 80.0–100.0)
Monocytes Absolute: 1.2 10*3/uL — ABNORMAL HIGH (ref 0.1–1.0)
Monocytes Relative: 8 %
Neutro Abs: 9.9 10*3/uL — ABNORMAL HIGH (ref 1.7–7.7)
Neutrophils Relative %: 67 %
Platelets: 339 10*3/uL (ref 150–400)
RBC: 6.63 MIL/uL — ABNORMAL HIGH (ref 4.22–5.81)
RDW: 17.3 % — ABNORMAL HIGH (ref 11.5–15.5)
WBC: 14.9 10*3/uL — ABNORMAL HIGH (ref 4.0–10.5)
nRBC: 0 % (ref 0.0–0.2)

## 2019-09-10 LAB — GLUCOSE, CAPILLARY: Glucose-Capillary: 87 mg/dL (ref 70–99)

## 2019-09-10 NOTE — Progress Notes (Signed)
0745: Assumed care of pt from night RN. Pt alert, denies pain, VSS, SpO2 95% on room air. Pt anticipating discharge today and is relieved to be going home. Denies needs at this time, will monitor.   1100: Discharge education and paperwork given to patient. Wife called and confirmed that O2 was just delivered to Pt house. PIV removed. Waiting on transport home.

## 2019-09-10 NOTE — Plan of Care (Signed)
Titrated off Oxygen on evenings and patient tolerated well. Awakened now, using urinal and sats down to 80-82%. Patient appears very anxious and 02 at 1L placed back on patient. Emotional support provided. 02 slowly back up to 92%.

## 2019-09-10 NOTE — Progress Notes (Signed)
SATURATION QUALIFICATIONS: (This note is used to comply with regulatory documentation for home oxygen)  Patient Saturations on Room Air at Rest = 95%  Patient Saturations on Room Air while Ambulating = 88%  Patient Saturations on 1 Liters of oxygen while Ambulating = 94%  Please briefly explain why patient needs home oxygen:  Pt needs oxygen while ambulating in order to maintain sats >88%.

## 2019-09-10 NOTE — TOC Transition Note (Signed)
Transition of Care (TOC) - CM/SW Discharge Note Marvetta Gibbons RN, BSN Transitions of Care Unit 4E- RN Case Manager ([place] weekend coverage) [phone]   Patient Details  Name: Don [name] MRN: [id number] Date of Birth: Feb 21, 1966  Transition of Care [facility]) CM/SW Contact:  Don Patricia, RN Phone Number: 09/10/2019, 10:47 AM   Clinical Narrative:    Pt stable for transition home today, HH and DME orders have been placed- spoke with wife via TC- discussed transition needs for home 02 and HH- wife does not have preference- confirmed arrangements with [employer] for home 02 and [facility] for [facility]. - Call made to Magda Paganini with [employer] for home 02 needs- home 02 equipment to be delivered to home this am for discharge home needs- call made to [facility] for portable tank to be taken to the room for transport home. Contacted Cory with [facility] to confirm referral for [facility] as noted in [facility] handoff- [facility] has accepted referral and can provide needed services for RN/PT. Pt to transport home with wife in private car.     Final next level of care: [place] Barriers to Discharge: No Barriers Identified   Patient Goals and CMS Choice Patient states their goals for this hospitalization and ongoing recovery are:: return home CMS Medicare.gov Compare Post Acute Care list provided to:: Patient Represenative (must comment)(wife) Choice offered to / list presented to : Spouse  Discharge Placement                 Home with [facility]      Discharge Plan and Services   Discharge Planning Services: CM Consult Post Acute Care Choice: Durable Medical Equipment, Home Health          DME Arranged: Oxygen DME Agency: [place] Date DME Agency Contacted: 09/10/19 Time DME Agency Contacted: [phone] Representative spoke with at DME Agency: Learta Codding HH Arranged: RN, PT [facility] Agency: [place] Date [place]: 09/10/19 Time [place]: [phone] Representative spoke  with at [place]: [place] ([place]) Interventions     Readmission Risk Interventions Readmission Risk Prevention Plan 09/10/2019  Post Dischage Appt Complete  Medication Screening Complete  Transportation Screening Complete  Some recent data might be hidden

## 2019-09-10 NOTE — Discharge Summary (Signed)
Don [name] NBV:[id number] DOB: 17-Oct-1965 DOA: 09/01/2019  PCP: Lujean Amel, MD  Admit date: 09/01/2019  Discharge date: 09/10/2019  Admitted From:Home  Disposition:  Home   Recommendations for Outpatient Follow-up:   Follow up with PCP in 1-2 weeks  PCP Please obtain BMP/CBC, 2 view CXR in 1week,  (see Discharge instructions)   PCP Please follow up on the following pending results:    Home Health: None   Equipment/Devices: 2 lit o2  Consultations: None  Discharge Condition: Stable    CODE STATUS: Full    Diet Recommendation: Heart Healthy     Chief Complaint  Patient presents with  . Shortness of Breath     Brief history of present illness from the day of admission and additional interim summary    53 yo male withmedical history of morbid obesity, asthma on IL-3 blocker shots twice a month, essential hypertension who was diagnosed with COVID-19 infection about 8 days ago finally presented with severe shortness of breath ongoing for 3 to 4 days. Was diagnosed with severe acute hypoxic respiratory failure and admitted by ICU team on 09/01/2019, he was started on Decadron and remdesivir and transferred to my care on 09/02/2019. He has arrived to the hospital and intense respiratory distress on 15 L high flow nasal cannula oxygen.                                                                 Hospital Course   1. Acute Hypoxic Resp. Failure due to Acute Covid 19 Viral Pneumonitis during the ongoing 2020 Covid 19 Pandemic -hehad severe disease and presented after 4 days of symptoms, he was in the ER on day 1 under ICU team. He had received Decadron and remdesivir 1 dose under the ICU team and was getting progressively hypoxic, when he arrived he was on 15 L high flow nasal cannula oxygen and visibly  short of breath.  He was transferred to my care on 09/02/2019 upon arrival to [facility] he immediately received a dose of Actemra x 2 after appropriate consent, he has shown remarkable improvement and now at rest he is down to 1 L nasal cannula oxygen, he still has exertional shortness of breath but this symptom precedes his illness and is due to his underlying deconditioning and obesity.  He will get home oxygen 2 L at rest and then as needed on ambulation as needed, I think he will come off of at rest oxygen within the next few days, he must follow with PCP for diet and exercise program along with weight loss.  COVID-19 Labs  Recent Labs    09/08/19 0054  DDIMER 0.86*  CRP 0.8    No results found for: SARSCOV2NAA  Hepatic Function Latest Ref Rng & Units 09/08/2019 09/07/2019 09/06/2019  Total Protein 6.5 -  8.1 g/dL 5.7(L) 5.7(L) 5.8(L)  Albumin 3.5 - 5.0 g/dL 2.9(L) 2.9(L) 2.9(L)  AST 15 - 41 U/L '22 23 22  '$ ALT 0 - 44 U/L 57(H) 54(H) 55(H)  Alk Phosphatase 38 - 126 U/L 78 75 76  Total Bilirubin 0.3 - 1.2 mg/dL 1.0 0.4 0.7    2.Morbid obesity. BMI of greater than 40. Follow with PCP for weight loss.Marland Kitchen  3.Asthma. Supportive care no wheezing.  4.Essential hypertension.  Blood pressure remains extremely high, he is on Norvasc, ARB and hydralazine, monitor and adjust.  5.GERD. On Pepcid.  6.Eczema. Takes IL-3 blocker at home. Resume once discharged.   7.  OSA.  Noncompliant with CPAP.  Counseled.  Has a pulmonary physician Dr. Elsworth Soho, recommended to follow with him post discharge.  8.  Sinus bradycardia with possibility of type II heart block.  Likely due to OSA, this happened while he was sleeping and oxygen has slipped off, upon waking up heart rate stable, stop beta-blocker, stable TSH & EKG, will also discussed with neurologist Dr. Golden Hurter who agreed with the plan on 09/08/2019.  Symptoms have completely resolved.  Must get a CPAP ASAP.  Has been warned about  the dangers of sudden cardiac arrest.  Discharge diagnosis     Active Problems:   Pneumonia due to COVID-19 virus   COVID-19    Discharge instructions    Discharge Instructions    Diet - low sodium heart healthy   Complete by: As directed    Discharge instructions   Complete by: As directed    Follow with Primary MD Koirala, Dibas, MD in 7 days, see your pulmonologist and get a sleep study done within 2 to 3 weeks of discharge.  Get CBC, CMP, 2 view Chest X ray -  checked next visit within 1 week by Primary MD    Activity: As tolerated with Full fall precautions use walker/cane & assistance as needed  Disposition Home    Diet: Heart Healthy    Special Instructions: If you have smoked or chewed Tobacco  in the last 2 yrs please stop smoking, stop any regular Alcohol  and or any Recreational drug use.  On your next visit with your primary care physician please Get Medicines reviewed and adjusted.  Please request your Prim.MD to go over all Hospital Tests and Procedure/Radiological results at the follow up, please get all Hospital records sent to your Prim MD by signing hospital release before you go home.  If you experience worsening of your admission symptoms, develop shortness of breath, life threatening emergency, suicidal or homicidal thoughts you must seek medical attention immediately by calling 911 or calling your MD immediately  if symptoms less severe.  You Must read complete instructions/literature along with all the possible adverse reactions/side effects for all the Medicines you take and that have been prescribed to you. Take any new Medicines after you have completely understood and accpet all the possible adverse reactions/side effects.   Increase activity slowly   Complete by: As directed    MyChart COVID-19 home monitoring program   Complete by: Sep 10, 2019    Is the patient willing to use the [facility] for home monitoring?: Yes   Temperature  monitoring   Complete by: Sep 10, 2019    After how many days would you like to receive a notification of this patient's flowsheet entries?: 1      Discharge Medications   Allergies as of 09/10/2019      Reactions  Lisinopril Other (See Comments)   Muscle aches and fatigue.      Medication List    STOP taking these medications   meloxicam 15 MG tablet Commonly known as: MOBIC     TAKE these medications   albuterol 108 (90 Base) MCG/ACT inhaler Commonly known as: VENTOLIN HFA Inhale 2 puffs into the lungs every 6 (six) hours as needed for shortness of breath.   amLODipine 10 MG tablet Commonly known as: NORVASC Take 10 mg by mouth daily.   Breo Ellipta 200-25 MCG/INH Aepb Generic drug: fluticasone furoate-vilanterol INHALE 1 PUFF INTO THE LUNGS DAILY. RINSE, GARGLE, AND SPIT AFTER USE.   Dupixent 300 MG/2ML prefilled syringe Generic drug: dupilumab INJECT '600MG'$  (2 SYRINGES)  SUBCUTANEOUSLY ON DAY 1,  '300MG'$  (1 SYRINGE) DAY 15,  THEN EVERY OTHER WEEK  THEREAFTER What changed: See the new instructions.   furosemide 20 MG tablet Commonly known as: LASIX Take 20 mg by mouth daily.   ipratropium-albuterol 0.5-2.5 (3) MG/3ML Soln Commonly known as: DUONEB TAKE 3 MLS BY NEBULIZATION EVERY 6 (SIX) HOURS AS NEEDED. What changed: reasons to take this   Pazeo 0.7 % Soln Generic drug: Olopatadine HCl Place 1 drop into both eyes daily as needed.   pimecrolimus 1 % cream Commonly known as: Elidel Apply topically 2 (two) times daily. What changed:   how much to take  when to take this  reasons to take this   sertraline 50 MG tablet Commonly known as: ZOLOFT Take 50 mg by mouth daily.   valsartan 320 MG tablet Commonly known as: DIOVAN Take 320 mg by mouth daily.   VITAMIN C PO Take 1 tablet by mouth daily.   ZINC PO Take 1 tablet by mouth daily.            Durable Medical Equipment  (From admission, onward)         Start     Ordered   09/08/19  1006  For home use only DME oxygen  Once    Question Answer Comment  Length of Need 6 Months   Mode or (Route) Nasal cannula   Liters per Minute 2   Frequency Continuous (stationary and portable oxygen unit needed)   Oxygen conserving device Yes   Oxygen delivery system Gas      09/08/19 1006          Follow-up Information    Koirala, Dibas, MD. Schedule an appointment as soon as possible for a visit in 1 week(s).   Specialty: Family Medicine Contact information: [address] [place] [address] [phone]           Major procedures and Radiology Reports - PLEASE review detailed and final reports thoroughly  -        DG CHEST PORT 1 VIEW  Result Date: 09/02/2019 CLINICAL DATA:  COVID-19 patient with fever. EXAM: PORTABLE CHEST 1 VIEW COMPARISON:  September 01, 2019 FINDINGS: Bilateral pulmonary infiltrates remain, similar given difference in technique in the interval. No other interval changes. IMPRESSION: Significant bilateral pulmonary infiltrates remain. Given difference in technique/penetration, there has been no definitive interval change. The findings are consistent with the patient's COVID-19 positive status. Electronically Signed   By: Dorise Bullion III M.D   On: 09/02/2019 21:01   DG Chest Port 1 View  Result Date: 09/01/2019 CLINICAL DATA:  Shortness of breath, COVID-19 positive EXAM: PORTABLE CHEST 1 VIEW COMPARISON:  01/18/2019 FINDINGS: The heart size and mediastinal contours are within normal limits.  Multifocal airspace consolidations within a predominantly peripheral distribution. No large pleural fluid collection. No pneumothorax. The visualized skeletal structures are unremarkable. IMPRESSION: Extensive multifocal airspace opacities in a predominantly peripheral distribution suggestive of multifocal viral pneumonia. Electronically Signed   By: Davina Poke M.D.   On: 09/01/2019 15:05    Micro Results     Recent Results (from  the past 240 hour(s))  Blood Culture (routine x 2)     Status: None   Collection Time: 09/01/19  2:22 PM   Specimen: BLOOD  Result Value Ref Range Status   Specimen Description BLOOD LEFT ANTECUBITAL  Final   Special Requests   Final    BOTTLES DRAWN AEROBIC AND ANAEROBIC Blood Culture adequate volume   Culture   Final    NO GROWTH 5 DAYS Performed at [facility], [address] [place], [place] [address]    Report Status 09/06/2019 FINAL  Final  Blood Culture (routine x 2)     Status: None   Collection Time: 09/01/19 11:30 PM   Specimen: BLOOD  Result Value Ref Range Status   Specimen Description BLOOD RIGHT ARM  Final   Special Requests   Final    BOTTLES DRAWN AEROBIC AND ANAEROBIC Blood Culture adequate volume   Culture   Final    NO GROWTH 5 DAYS Performed at [facility], [place] [address], [place], [place] [address]    Report Status 09/07/2019 FINAL  Final    Today   Subjective    Don [name] today has no headache,no chest abdominal pain,no new weakness tingling or numbness, feels much better wants to go home today.     Objective   Blood pressure (!) 136/99, pulse 92, temperature 98.1 F (36.7 C), temperature source Oral, resp. rate 18, height '5\' 8"'$  (1.727 m), weight (!) 158.2 kg, SpO2 96 %.   Intake/Output Summary (Last 24 hours) at 09/10/2019 0900 Last data filed at 09/10/2019 0400 Gross per 24 hour  Intake 1494 ml  Output 2040 ml  Net -546 ml    Exam Awake Alert, Oriented x 3, No new F.N deficits, Normal affect Carlyss.AT,PERRAL Supple Neck,No JVD, No cervical lymphadenopathy appriciated.  Symmetrical Chest wall movement, Good air movement bilaterally, CTAB RRR,No Gallops,Rubs or new Murmurs, No Parasternal Heave +ve B.Sounds, Abd Soft, Non tender, No organomegaly appriciated, No rebound -guarding or rigidity. No Cyanosis, Clubbing or edema, No new Rash or bruise   Data Review   CBC w Diff:  Lab Results  Component Value Date   WBC 17.6  (H) 09/09/2019   HGB 17.1 (H) 09/09/2019   HGB 14.7 10/04/2018   HGB 15.1 10/09/2016   HCT 54.3 (H) 09/09/2019   HCT 44.2 10/04/2018   HCT 45.2 10/09/2016   PLT 326 09/09/2019   PLT 374 10/04/2018   LYMPHOPCT 13 09/09/2019   LYMPHOPCT 20.4 10/09/2016   MONOPCT 7 09/09/2019   MONOPCT 8.8 10/09/2016   EOSPCT 4 09/09/2019   EOSPCT 8.7 (H) 10/09/2016   BASOPCT 0 09/09/2019   BASOPCT 1.5 10/09/2016    CMP:  Lab Results  Component Value Date   NA 140 09/08/2019   NA 139 10/09/2016   K 4.0 09/08/2019   K 5.0 10/09/2016   CL 102 09/08/2019   CO2 26 09/08/2019   CO2 30 (H) 10/09/2016   BUN 39 (H) 09/08/2019   BUN 16.8 10/09/2016   CREATININE 1.16 09/08/2019   CREATININE 1.2 10/09/2016   PROT 5.7 (L) 09/08/2019   PROT  7.0 10/09/2016   ALBUMIN 2.9 (L) 09/08/2019   ALBUMIN 3.5 10/09/2016   BILITOT 1.0 09/08/2019   BILITOT 0.56 10/09/2016   ALKPHOS 78 09/08/2019   ALKPHOS 102 10/09/2016   AST 22 09/08/2019   AST 21 10/09/2016   ALT 57 (H) 09/08/2019   ALT 35 10/09/2016  .   Total Time in preparing paper work, data evaluation and todays exam - 73 minutes  Lala Lund M.D on 09/10/2019 at 9:00 AM  [facility]   Office  [phone]

## 2019-09-10 NOTE — Discharge Instructions (Signed)
Follow with Primary MD Koirala, Dibas, MD in 7 days, see your pulmonologist and get a sleep study done within 2 to 3 weeks of discharge.  Get CBC, CMP, 2 view Chest X ray -  checked next visit within 1 week by Primary MD    Activity: As tolerated with Full fall precautions use walker/cane & assistance as needed  Disposition Home    Diet: Heart Healthy    Special Instructions: If you have smoked or chewed Tobacco  in the last 2 yrs please stop smoking, stop any regular Alcohol  and or any Recreational drug use.  On your next visit with your primary care physician please Get Medicines reviewed and adjusted.  Please request your Prim.MD to go over all Hospital Tests and Procedure/Radiological results at the follow up, please get all Hospital records sent to your Prim MD by signing hospital release before you go home.  If you experience worsening of your admission symptoms, develop shortness of breath, life threatening emergency, suicidal or homicidal thoughts you must seek medical attention immediately by calling 911 or calling your MD immediately  if symptoms less severe.  You Must read complete instructions/literature along with all the possible adverse reactions/side effects for all the Medicines you take and that have been prescribed to you. Take any new Medicines after you have completely understood and accpet all the possible adverse reactions/side effects.       Person Under Monitoring Name: Don [name]  Location: [address] [place] [id number]   Infection Prevention Recommendations for Individuals Confirmed to have, or Being Evaluated for, 2019 Novel Coronavirus (COVID-19) Infection Who Receive Care at Home  Individuals who are confirmed to have, or are being evaluated for, COVID-19 should follow the prevention steps below until a healthcare provider or local or state health department says they can return to normal activities.  Stay home except to get  medical care You should restrict activities outside your home, except for getting medical care. Do not go to work, school, or public areas, and do not use public transportation or taxis.  Call ahead before visiting your doctor Before your medical appointment, call the healthcare provider and tell them that you have, or are being evaluated for, COVID-19 infection. This will help the healthcare provider's office take steps to keep other people from getting infected. Ask your healthcare provider to call the local or state health department.  Monitor your symptoms Seek prompt medical attention if your illness is worsening (e.g., difficulty breathing). Before going to your medical appointment, call the healthcare provider and tell them that you have, or are being evaluated for, COVID-19 infection. Ask your healthcare provider to call the local or state health department.  Wear a facemask You should wear a facemask that covers your nose and mouth when you are in the same room with other people and when you visit a healthcare provider. People who live with or visit you should also wear a facemask while they are in the same room with you.  Separate yourself from other people in your home As much as possible, you should stay in a different room from other people in your home. Also, you should use a separate bathroom, if available.  Avoid sharing household items You should not share dishes, drinking glasses, cups, eating utensils, towels, bedding, or other items with other people in your home. After using these items, you should wash them thoroughly with soap and water.  Cover your coughs and sneezes Cover your mouth  and nose with a tissue when you cough or sneeze, or you can cough or sneeze into your sleeve. Throw used tissues in a lined trash can, and immediately wash your hands with soap and water for at least 20 seconds or use an alcohol-based hand rub.  Wash your [facility] your hands  often and thoroughly with soap and water for at least 20 seconds. You can use an alcohol-based hand sanitizer if soap and water are not available and if your hands are not visibly dirty. Avoid touching your eyes, nose, and mouth with unwashed hands.   Prevention Steps for Caregivers and Household Members of Individuals Confirmed to have, or Being Evaluated for, COVID-19 Infection Being Cared for in the Home  If you live with, or provide care at home for, a person confirmed to have, or being evaluated for, COVID-19 infection please follow these guidelines to prevent infection:  Follow healthcare provider's instructions Make sure that you understand and can help the patient follow any healthcare provider instructions for all care.  Provide for the patient's basic needs You should help the patient with basic needs in the home and provide support for getting groceries, prescriptions, and other personal needs.  Monitor the patient's symptoms If they are getting sicker, call his or her medical provider and tell them that the patient has, or is being evaluated for, COVID-19 infection. This will help the healthcare provider's office take steps to keep other people from getting infected. Ask the healthcare provider to call the local or state health department.  Limit the number of people who have contact with the patient  If possible, have only one caregiver for the patient.  Other household members should stay in another home or place of residence. If this is not possible, they should stay  in another room, or be separated from the patient as much as possible. Use a separate bathroom, if available.  Restrict visitors who do not have an essential need to be in the home.  Keep older adults, very young children, and other sick people away from the patient Keep older adults, very young children, and those who have compromised immune systems or chronic health conditions away from the patient.  This includes people with chronic heart, lung, or kidney conditions, diabetes, and cancer.  Ensure good ventilation Make sure that shared spaces in the home have good air flow, such as from an air conditioner or an opened window, weather permitting.  Wash your hands often  Wash your hands often and thoroughly with soap and water for at least 20 seconds. You can use an alcohol based hand sanitizer if soap and water are not available and if your hands are not visibly dirty.  Avoid touching your eyes, nose, and mouth with unwashed hands.  Use disposable paper towels to dry your hands. If not available, use dedicated cloth towels and replace them when they become wet.  Wear a facemask and gloves  Wear a disposable facemask at all times in the room and gloves when you touch or have contact with the patient's blood, body fluids, and/or secretions or excretions, such as sweat, saliva, sputum, nasal mucus, vomit, urine, or feces.  Ensure the mask fits over your nose and mouth tightly, and do not touch it during use.  Throw out disposable facemasks and gloves after using them. Do not reuse.  Wash your hands immediately after removing your facemask and gloves.  If your personal clothing becomes contaminated, carefully remove clothing and launder. Wash your  hands after handling contaminated clothing.  Place all used disposable facemasks, gloves, and other waste in a lined container before disposing them with other household waste.  Remove gloves and wash your hands immediately after handling these items.  Do not share dishes, glasses, or other household items with the patient  Avoid sharing household items. You should not share dishes, drinking glasses, cups, eating utensils, towels, bedding, or other items with a patient who is confirmed to have, or being evaluated for, COVID-19 infection.  After the person uses these items, you should wash them thoroughly with soap and water.  Wash laundry  thoroughly  Immediately remove and wash clothes or bedding that have blood, body fluids, and/or secretions or excretions, such as sweat, saliva, sputum, nasal mucus, vomit, urine, or feces, on them.  Wear gloves when handling laundry from the patient.  Read and follow directions on labels of laundry or clothing items and detergent. In general, wash and dry with the warmest temperatures recommended on the label.  Clean all areas the individual has used often  Clean all touchable surfaces, such as counters, tabletops, doorknobs, bathroom fixtures, toilets, phones, keyboards, tablets, and bedside tables, every day. Also, clean any surfaces that may have blood, body fluids, and/or secretions or excretions on them.  Wear gloves when cleaning surfaces the patient has come in contact with.  Use a diluted bleach solution (e.g., dilute bleach with 1 part bleach and 10 parts water) or a household disinfectant with a label that says EPA-registered for coronaviruses. To make a bleach solution at home, add 1 tablespoon of bleach to 1 quart (4 cups) of water. For a larger supply, add  cup of bleach to 1 gallon (16 cups) of water.  Read labels of cleaning products and follow recommendations provided on product labels. Labels contain instructions for safe and effective use of the cleaning product including precautions you should take when applying the product, such as wearing gloves or eye protection and making sure you have good ventilation during use of the product.  Remove gloves and wash hands immediately after cleaning.  Monitor yourself for signs and symptoms of illness Caregivers and household members are considered close contacts, should monitor their health, and will be asked to limit movement outside of the home to the extent possible. Follow the monitoring steps for close contacts listed on the symptom monitoring form.   ? If you have additional questions, contact your local health department  or call the epidemiologist on call at [phone] (available 24/7). ? This guidance is subject to change. For the most up-to-date guidance from [facility], please refer to their website: [email or web address]hu

## 2019-09-11 LAB — GLUCOSE, CAPILLARY: Glucose-Capillary: 142 mg/dL — ABNORMAL HIGH (ref 70–99)

## 2019-09-18 ENCOUNTER — Encounter: Payer: Self-pay | Admitting: Adult Health

## 2019-09-18 ENCOUNTER — Ambulatory Visit (INDEPENDENT_AMBULATORY_CARE_PROVIDER_SITE_OTHER): Payer: 59 | Admitting: Adult Health

## 2019-09-18 ENCOUNTER — Other Ambulatory Visit: Payer: Self-pay

## 2019-09-18 ENCOUNTER — Ambulatory Visit (INDEPENDENT_AMBULATORY_CARE_PROVIDER_SITE_OTHER): Payer: 59

## 2019-09-18 VITALS — BP 132/76 | HR 72 | Temp 97.3°F | Ht 68.0 in | Wt 350.0 lb

## 2019-09-18 DIAGNOSIS — J9611 Chronic respiratory failure with hypoxia: Secondary | ICD-10-CM | POA: Insufficient documentation

## 2019-09-18 DIAGNOSIS — G4733 Obstructive sleep apnea (adult) (pediatric): Secondary | ICD-10-CM

## 2019-09-18 DIAGNOSIS — J4550 Severe persistent asthma, uncomplicated: Secondary | ICD-10-CM | POA: Diagnosis not present

## 2019-09-18 DIAGNOSIS — J1282 Pneumonia due to coronavirus disease 2019: Secondary | ICD-10-CM

## 2019-09-18 DIAGNOSIS — U071 COVID-19: Secondary | ICD-10-CM

## 2019-09-18 DIAGNOSIS — J849 Interstitial pulmonary disease, unspecified: Secondary | ICD-10-CM

## 2019-09-18 DIAGNOSIS — J1289 Other viral pneumonia: Secondary | ICD-10-CM | POA: Diagnosis not present

## 2019-09-18 NOTE — Progress Notes (Signed)
@Patient  ID: Don [name], male    DOB: Nov 10, 1965, 53 y.o.   MRN: [id number]  Chief Complaint  Patient presents with  . Follow-up    COVID     Referring provider: Darrow BussingKoirala, Dibas, MD  HPI: 53 year old male morbidly obese seen for pulmonary consult August 04, 2019 to establish for severe persistent asthma and obstructive sleep apnea Medical history significant for hypertension and morbid obesity (BMI 55) COVID-19 08/25/19  TEST/EVENTS :  05/2019 spirometry-ratio 87, FEV1 1.97-54%, FVC 2.2 648% 09/2018 spirometry-ratio 80, FEV1 86%, FVC 84% 09/2018 eos 0  Home sleep study April 2019 showed severe sleep apnea with REI 73/hour severe desaturations with minimum O2 saturation at 58%.    09/18/2019 Follow up : COVID 19 , Pneumonia , Asthma  Patient returns for a 2-week follow-up.  Patient was seen last visit after developing cough fever and wheezing after being diagnosed with COVID-19 on August 25, 2019.  Patient had progressive symptoms with fever cough shortness of breath.  We attempted to set patient up for outpatient monoclonal antibody infusion unfortunately patient continued to progress with his shortness of breath.  He was admitted with acute respiratory failure requiring high flow oxygen up to 15 L.  Patient was found to have a viral pneumonitis.  He was treated with Decadron and remdesivir.  He also received Actemra .  Patient slowly improved.  And was titrated on oxygen down to 2 L prior to discharge.  He was discharged on oxygen 2 L.  Since discharge patient is feeling better but remains very weak, low energy and low activity tolerance. O2 sat good at 2l/m at rest, activity will drop in mid to low 80s. Walk test in office O2 sats >90% on 2l/ at rest and required 4l/m with short distance walk. Cough is decreased. Appetite is improved.  Chest xray today shows bilateral diffuse opacities. (my personal review slightly improved since last CXR).   Patient has underlying asthma.  He  [place].  He takes Dupixent injections. Denies any flare of cough or wheezing currently.   Allergies  Allergen Reactions  . Lisinopril Other (See Comments)    Muscle aches and fatigue.    Immunization History  Administered Date(s) Administered  . Influenza,inj,Quad PF,6+ Mos 08/07/2016, 08/04/2019  . Influenza,inj,quad, With Preservative 07/31/2015  . Pneumococcal Polysaccharide-23 03/16/2014  . Td 05/20/1986  . Tdap 05/20/2012, 04/03/2019    Past Medical History:  Diagnosis Date  . Asthma   . Claustrophobia   . Depression   . Hypertension   . Kidney stone    YRS AGO   PASSED ON OWN     Tobacco History: Social History   Tobacco Use  Smoking Status Former Smoker  . Packs/day: 1.00  . Years: 6.00  . Pack years: 6.00  . Types: Cigarettes  . Quit date: 09/28/1990  . Years since quitting: 28.9  Smokeless Tobacco Never Used   Counseling given: Not Answered   Outpatient Medications Prior to Visit  Medication Sig Dispense Refill  . albuterol (PROVENTIL HFA;VENTOLIN HFA) 108 (90 Base) MCG/ACT inhaler Inhale 2 puffs into the lungs every 6 (six) hours as needed for shortness of breath. 1 Inhaler 2  . amLODipine (NORVASC) 10 MG tablet Take 10 mg by mouth daily.    . Ascorbic Acid (VITAMIN C PO) Take 1 tablet by mouth daily.    Marland Kitchen. BREO ELLIPTA 200-25 MCG/INH AEPB INHALE 1 PUFF INTO THE LUNGS DAILY. RINSE, GARGLE, AND SPIT AFTER USE. (Patient taking differently: Inhale  1 puff into the lungs daily. Rinse, gargle, and spit after use.) 60 each 3  . DUPIXENT 300 MG/2ML SOSY INJECT 600MG  (2 SYRINGES)  SUBCUTANEOUSLY ON DAY 1,  300MG  (1 SYRINGE) DAY 15,  THEN EVERY OTHER WEEK  THEREAFTER (Patient taking differently: Inject 300 mg into the skin every 14 (fourteen) days. Thursdays) 4 mL 11  . furosemide (LASIX) 20 MG tablet Take 40 mg by mouth daily.     ipratropium-albuterol (DUONEB) 0.5-2.5 (3) MG/3ML SOLN TAKE 3 MLS BY NEBULIZATION EVERY 6 (SIX) HOURS AS NEEDED. (Patient  taking differently: Take 3 mLs by nebulization every 6 (six) hours as needed (shortness of breath). ) 180 mL 0  . Multiple Vitamins-Minerals (ZINC PO) Take 1 tablet by mouth daily.    . Olopatadine HCl (PAZEO) 0.7 % SOLN Place 1 drop into both eyes daily as needed. 2.5 mL 3  . pimecrolimus (ELIDEL) 1 % cream Apply topically 2 (two) times daily. (Patient taking differently: Apply 1 application topically as needed. ) 30 g 0  . sertraline (ZOLOFT) 50 MG tablet Take 50 mg by mouth daily.     . valsartan (DIOVAN) 320 MG tablet Take 320 mg by mouth daily.      Facility-Administered Medications Prior to Visit  Medication Dose Route Frequency Provider Last Rate Last Admin  . dupilumab (DUPIXENT) prefilled syringe 300 mg  300 mg Subcutaneous Q14 Days 02-28-1980, MD   300 mg at 08/10/19 1716     Review of Systems:   Constitutional:   No  weight loss, night sweats,  Fevers, chills, + fatigue, or  lassitude.  HEENT:   No headaches,  Difficulty swallowing,  Tooth/dental problems, or  Sore throat,                No sneezing, itching, ear ache, nasal congestion, post nasal drip,   CV:  No chest pain,  Orthopnea, PND, swelling in lower extremities, anasarca, dizziness, palpitations, syncope.   GI  No heartburn, indigestion, abdominal pain, nausea, vomiting, diarrhea, change in bowel habits, loss of appetite, bloody stools.   Resp:  .  No chest wall deformity  Skin: no rash or lesions.  GU: no dysuria, change in color of urine, no urgency or frequency.  No flank pain, no hematuria   MS:  No joint pain or swelling.  No decreased range of motion.  No back pain.    Physical Exam  BP 132/76 (BP Location: Right Arm, Cuff Size: Large)   Pulse 72   Temp (!) 97.3 F (36.3 C) (Temporal)   Ht 5\' 8"  (1.727 m)   Wt (!) 350 lb (158.8 kg)   SpO2 95% Comment: RA  BMI 53.22 kg/m   GEN: A/Ox3; pleasant , NAD, obese per BMI , on O2 , in WC    HEENT:  [place]/AT,    NOSE-clear, THROAT-clear,  no lesions, no postnasal drip or exudate noted.   NECK:  Supple w/ fair ROM; no JVD; normal carotid impulses w/o bruits; no thyromegaly or nodules palpated; no lymphadenopathy.    RESP  Faint BB crackles no accessory muscle use, no dullness to percussion  CARD:  RRR, no m/r/g,  Tr peripheral edema, pulses intact, no cyanosis or clubbing.  GI:   Soft & nt; nml bowel sounds; no organomegaly or masses detected.   Musco: Warm bil, no deformities or joint swelling noted.   Neuro: alert, no focal deficits noted.    Skin: Warm, no lesions or rashes  Lab Results:  CBC    Component Value Date/Time   WBC 14.9 (H) 09/10/2019 0422   RBC 6.63 (H) 09/10/2019 0422   HGB 17.2 (H) 09/10/2019 0422   HGB 14.7 10/04/2018 1841   HGB 15.1 10/09/2016 1132   HCT 55.3 (H) 09/10/2019 0422   HCT 44.2 10/04/2018 1841   HCT 45.2 10/09/2016 1132   PLT 339 09/10/2019 0422   PLT 374 10/04/2018 1841   MCV 83.4 09/10/2019 0422   MCV 81 10/04/2018 1841   MCV 82.6 10/09/2016 1132   MCH 25.9 (L) 09/10/2019 0422   MCHC 31.1 09/10/2019 0422   RDW 17.3 (H) 09/10/2019 0422   RDW 13.8 10/04/2018 1841   RDW 14.1 10/09/2016 1132   LYMPHSABS 2.3 09/10/2019 0422   LYMPHSABS 2.9 10/04/2018 1841   LYMPHSABS 2.4 10/09/2016 1132   MONOABS 1.2 (H) 09/10/2019 0422   MONOABS 1.0 (H) 10/09/2016 1132   EOSABS 0.9 (H) 09/10/2019 0422   EOSABS 0.0 10/04/2018 1841   BASOSABS 0.0 09/10/2019 0422   BASOSABS 0.1 10/04/2018 1841   BASOSABS 0.2 (H) 10/09/2016 1132    BMET    Component Value Date/Time   NA 140 09/08/2019 0054   NA 139 10/09/2016 1132   K 4.0 09/08/2019 0054   K 5.0 10/09/2016 1132   CL 102 09/08/2019 0054   CO2 26 09/08/2019 0054   CO2 30 (H) 10/09/2016 1132   GLUCOSE 123 (H) 09/08/2019 0054   GLUCOSE 96 10/09/2016 1132   BUN 39 (H) 09/08/2019 0054   BUN 16.8 10/09/2016 1132   CREATININE 1.16 09/08/2019 0054   CREATININE 1.2 10/09/2016 1132   CALCIUM 8.3 (L) 09/08/2019 0054   CALCIUM 9.8  10/09/2016 1132   GFRNONAA >60 09/08/2019 0054   GFRAA >60 09/08/2019 0054    BNP    Component Value Date/Time   BNP 29.1 09/08/2019 0054   BNP 18.7 09/22/2016 1614    ProBNP No results found for: PROBNP  Imaging: DG Chest 2 View  Result Date: 09/18/2019 CLINICAL DATA:  Pneumonia, follow-up. EXAM: CHEST - 2 VIEW COMPARISON:  09/02/2019 FINDINGS: Extensive bilateral airspace disease and interstitial opacities. Findings are similar to prior study. Heart is borderline in size. No effusions or pneumothorax. No acute bony abnormality. IMPRESSION: Extensive bilateral interstitial and airspace opacities, not significantly changed. Electronically Signed   By: Rolm Baptise M.D.   On: 09/18/2019 12:23   DG CHEST PORT 1 VIEW  Result Date: 09/02/2019 CLINICAL DATA:  COVID-19 patient with fever. EXAM: PORTABLE CHEST 1 VIEW COMPARISON:  September 01, 2019 FINDINGS: Bilateral pulmonary infiltrates remain, similar given difference in technique in the interval. No other interval changes. IMPRESSION: Significant bilateral pulmonary infiltrates remain. Given difference in technique/penetration, there has been no definitive interval change. The findings are consistent with the patient's COVID-19 positive status. Electronically Signed   By: Dorise Bullion III M.D   On: 09/02/2019 21:01   DG Chest Port 1 View  Result Date: 09/01/2019 CLINICAL DATA:  Shortness of breath, COVID-19 positive EXAM: PORTABLE CHEST 1 VIEW COMPARISON:  01/18/2019 FINDINGS: The heart size and mediastinal contours are within normal limits. Multifocal airspace consolidations within a predominantly peripheral distribution. No large pleural fluid collection. No pneumothorax. The visualized skeletal structures are unremarkable. IMPRESSION: Extensive multifocal airspace opacities in a predominantly peripheral distribution suggestive of multifocal viral pneumonia. Electronically Signed   By: Davina Poke M.D.   On: 09/01/2019 15:05     dupilumab (DUPIXENT) prefilled syringe 300 mg    Date Action  Dose Route User   Discharged on 09/10/2019   Admitted on 09/01/2019   08/10/2019 1716 Given 300 mg Subcutaneous (Left Arm) Osa Craver, [place]   07/27/2019 1733 Given 300 mg Subcutaneous (Left Arm) Fernandez-Vernon, Ashleigh R, CMA      No flowsheet data found.  No results found for: NITRICOXIDE      Assessment & Plan:   Pneumonia due to COVID-19 virus Viral pneumonitis - clinically he is improving with decreased oxygen demands. 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   HRCT chest and PFT in 6 weeks    Asthma Controlled on rx  No changes   OSA (obstructive sleep apnea) Severe OSA, previously CPAP intolerant.  Needs set up for CPAP titration    Chronic respiratory failure with hypoxia (HCC) Post COVID Respiratory Failure - demands are decreasing  Will continue on O2 to keep sats >90%  Adjust to 4l/m with activity   Plan  Patient Instructions  Set up for HRCT Chest in 6 -8 weeks  Set up for PFT in 6 -8 weeks  Continue on Breo 1 puff daily, rinse after use Albuterol inhaler or nebulizer as needed for wheezing. Set up CPAP Titration study.  Follow up in 6-8 weeks with Dr. Vassie Loll  And As needed   Please contact office for sooner follow up if symptoms do not improve or worsen or seek emergency care          Rubye Oaks, NP 09/18/2019

## 2019-09-18 NOTE — Assessment & Plan Note (Signed)
Severe OSA, previously CPAP intolerant.  Needs set up for CPAP titration

## 2019-09-18 NOTE — Assessment & Plan Note (Signed)
Post COVID Respiratory Failure - demands are decreasing  Will continue on O2 to keep sats >90%  Adjust to 4l/m with activity   Plan  Patient Instructions  Set up for HRCT Chest in 6 -8 weeks  Set up for PFT in 6 -8 weeks  Continue on Breo 1 puff daily, rinse after use Albuterol inhaler or nebulizer as needed for wheezing. Set up CPAP Titration study.  Follow up in 6-8 weeks with Dr. Elsworth Soho  And As needed   Please contact office for sooner follow up if symptoms do not improve or worsen or seek emergency care

## 2019-09-18 NOTE — Patient Instructions (Addendum)
Set up for HRCT Chest in 6 -8 weeks  Set up for PFT in 6 -8 weeks  Continue on Breo 1 puff daily, rinse after use Albuterol inhaler or nebulizer as needed for wheezing. Set up CPAP Titration study.  Continue on Oxygen 2l/m rest and 4l/m with activity  Follow up in 6-8 weeks with Dr. Elsworth Soho  And As needed   Please contact office for sooner follow up if symptoms do not improve or worsen or seek emergency care

## 2019-09-18 NOTE — Assessment & Plan Note (Signed)
Controlled on rx  No changes  

## 2019-09-18 NOTE — Assessment & Plan Note (Signed)
Viral pneumonitis - clinically he is improving with decreased oxygen demands. 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   HRCT chest and PFT in 6 weeks

## 2019-09-18 NOTE — Addendum Note (Signed)
Addended by: Parke Poisson E on: 09/18/2019 02:44 PM   Modules accepted: Orders

## 2019-10-09 ENCOUNTER — Telehealth: Payer: Self-pay | Admitting: Adult Health

## 2019-10-09 NOTE — Telephone Encounter (Signed)
Don [name] was trying to call him.  Will route message to her.

## 2019-10-09 NOTE — Telephone Encounter (Signed)
Spoke with the pt  He states that he missed a call from [place] but no voice mail  PCC's did you possibly call him to try and schedule his CT?  Please advise, thanks!

## 2019-10-10 ENCOUNTER — Telehealth: Payer: Self-pay | Admitting: Adult Health

## 2019-10-10 DIAGNOSIS — G4733 Obstructive sleep apnea (adult) (pediatric): Secondary | ICD-10-CM

## 2019-10-10 NOTE — Telephone Encounter (Signed)
I believe that was Rubye Oaks NP who called the patient:  Parrett, Virgel Bouquet, NP  Lowell Bouton, CMA; Parrett, Virgel Bouquet, NP  Dr. Vassie Loll does not want a CT chest on him , we need to cancel and he can discuss with dr Vassie Loll  I will call him can you cancel the order.    The CT has been cancelled Called spoke with patient, discussed the above with him Patient is scheduled to see Dr Vassie Loll on 2.5.21 and is aware CT will be discussed at that time.  Nothing further needed; will sign off

## 2019-10-10 NOTE — Telephone Encounter (Signed)
That is fine , change to Split night Sleep study

## 2019-10-10 NOTE — Telephone Encounter (Signed)
Please advise if I can place a new order for a Split night instead.

## 2019-10-10 NOTE — Telephone Encounter (Signed)
Split night study was ordered

## 2019-10-11 ENCOUNTER — Telehealth: Payer: Self-pay | Admitting: *Deleted

## 2019-10-11 NOTE — Telephone Encounter (Signed)
Received a call from patient's pharmacy regarding his Dupixent injections. They stated that they do have a current prescription for the pre-filled Dupixent injections but that the patient requested to be switched to the Auto-Injector syringes and that the pharmacy requested a new Rx for the Auto-Injector Syringe. Please advise.

## 2019-10-12 NOTE — Telephone Encounter (Signed)
New Rx sent for Dupixent pens sent to [facility]

## 2019-10-19 ENCOUNTER — Other Ambulatory Visit: Payer: Self-pay

## 2019-10-25 ENCOUNTER — Ambulatory Visit (HOSPITAL_BASED_OUTPATIENT_CLINIC_OR_DEPARTMENT_OTHER): Payer: 59 | Attending: Adult Health | Admitting: Pulmonary Disease

## 2019-10-25 ENCOUNTER — Encounter (HOSPITAL_BASED_OUTPATIENT_CLINIC_OR_DEPARTMENT_OTHER): Payer: 59 | Admitting: Pulmonary Disease

## 2019-10-25 ENCOUNTER — Other Ambulatory Visit: Payer: Self-pay

## 2019-10-25 DIAGNOSIS — G4733 Obstructive sleep apnea (adult) (pediatric): Secondary | ICD-10-CM

## 2019-10-25 DIAGNOSIS — J45909 Unspecified asthma, uncomplicated: Secondary | ICD-10-CM | POA: Diagnosis not present

## 2019-10-25 DIAGNOSIS — Z8616 Personal history of COVID-19: Secondary | ICD-10-CM | POA: Insufficient documentation

## 2019-10-26 ENCOUNTER — Other Ambulatory Visit (HOSPITAL_BASED_OUTPATIENT_CLINIC_OR_DEPARTMENT_OTHER): Payer: Self-pay

## 2019-10-26 DIAGNOSIS — G4733 Obstructive sleep apnea (adult) (pediatric): Secondary | ICD-10-CM

## 2019-10-26 NOTE — Procedures (Signed)
    Patient Name: Don, [name] Date: 10/25/2019 Gender: Male D.O.B: 03-24-1966 Age (years): 53 Referring Provider: Tammy Parrett Height (inches): 68 Interpreting Physician: Chesley Mires MD, ABSM Weight (lbs): 350 RPSGT: Laren Everts BMI: 60 MRN: [id number] Neck Size: 19.00  CLINICAL INFORMATION 54 year old male with history of asthma, COVID pneumonia from December 2020, and obstructive sleep apnea.  Previously intolerant of CPAP.  Has continue snoring, sleep disrusption, and daytime sleepiness.  The patient is referred for a split night study with Bipap.  MEDICATIONS Medications self-administered by patient taken the night of the study : N/A  SLEEP STUDY TECHNIQUE As per the AASM Manual for the Scoring of Sleep and Associated Events v2.3 (April 2016) with a hypopnea requiring 4% desaturations.  The channels recorded and monitored were frontal, central and occipital EEG, electrooculogram (EOG), submentalis EMG (chin), nasal and oral airflow, thoracic and abdominal wall motion, anterior tibialis EMG, snore microphone, electrocardiogram, and pulse oximetry. Bi-level positive airway pressure (BiPAP) was initiated when the patient met split night criteria and was titrated according to treat sleep-disordered breathing.  RESPIRATORY PARAMETERS Diagnostic  Total AHI (/hr): 153.3 RDI (/hr): 156.7 OA Index (/hr): - CA Index (/hr): 0.5 REM AHI (/hr): N/A NREM AHI (/hr): 153.3 Supine AHI (/hr): N/A Non-supine AHI (/hr): 156.73 Min O2 Sat (%): 79.0 Mean O2 (%): 87.8 Time below 88% (min): 72.5   Titration  Optimal IPAP Pressure (cm): 18 Optimal EPAP Pressure (cm): 14 AHI at Optimal Pressure (/hr): 5.9 Min O2 at Optimal Pressure (%): 84.0 Sleep % at Optimal (%): 100 Supine % at Optimal (%): 100     SLEEP ARCHITECTURE The study was initiated at 10:10:28 PM and terminated at 5:12:49 AM. The total recorded time was 422.4 minutes. EEG confirmed total sleep time was 236 minutes  yielding a sleep efficiency of 55.9%%. Sleep onset after lights out was 42.5 minutes with a REM latency of 329.5 minutes. The patient spent 13.3%% of the night in stage N1 sleep, 66.5%% in stage N2 sleep, 0.0%% in stage N3 and 20.1% in REM. Wake after sleep onset (WASO) was 143.9 minutes. The Arousal Index was 40.4/hour.  LEG MOVEMENT DATA The total Periodic Limb Movements of Sleep (PLMS) were 0. The PLMS index was 0.0 .  CARDIAC DATA The 2 lead EKG demonstrated sinus rhythm. The mean heart rate was 100.0 beats per minute. Other EKG findings include: None.  IMPRESSIONS - Severe obstructive sleep apnea with an AHI of 153.3 and SpO2 low of 79%. - The study was done without the use of supplemental oxygen. - He was tried on CPAP up to 11 cm H2O.  He had continued respiratory events and unable to tolerate CPAP settings.  He failed titration with CPAP. - He did well with Bipap 18/14 cm H2O.  He did still have borderline low oxygen levels with this Bipap setting.  DIAGNOSIS - Obstructive Sleep Apnea (327.23 [G47.33 ICD-10])  RECOMMENDATIONS - Trial of BiPAP therapy on 18/14 cm H2O with a Small size Fisher&Paykel Full Face Mask F&P Vitera (new) mask and heated humidification. - He should undergo an overnight oximetry with Bipap as an outpatient.  If his oxygen levels are still low, then he might need full night Bipap titration study to determine if he needs supplemental oxygen at night with Bipap.  [Electronically signed] 10/26/2019 02:31 PM  Chesley Mires MD, [place], American Board of Sleep Medicine   NPI: [id number]

## 2019-10-27 ENCOUNTER — Ambulatory Visit: Payer: 59 | Admitting: Allergy

## 2019-11-01 ENCOUNTER — Telehealth: Payer: Self-pay | Admitting: Pulmonary Disease

## 2019-11-01 DIAGNOSIS — G4733 Obstructive sleep apnea (adult) (pediatric): Secondary | ICD-10-CM

## 2019-11-01 NOTE — Telephone Encounter (Signed)
Sleep study showed severe OSA. High cardiac risk. This was improved on BiPAP, I note his previous bad appearance with CPAP but he should do much better on BiPAP. Is he willing to try again? If so, send prescription for auto BiPAP EPAP min 10, pressure support of 4, IPAP maximum 20  Small size Fisher&Paykel Full Face Mask F&P Vitera (new) mask and heated humidification  Needs office visit with me/TP in 6 weeks

## 2019-11-02 NOTE — Telephone Encounter (Signed)
Called the patient and made him aware of the results. Patient voiced understanding and stated he was willing to try the BIPAP. Order sent to [facility].  Patient scheduled for BIPAP follow up on 12/18/19 at 10:30 video visit with Rubye Oaks, NP

## 2019-11-03 ENCOUNTER — Encounter: Payer: Self-pay | Admitting: Adult Health

## 2019-11-03 ENCOUNTER — Ambulatory Visit (INDEPENDENT_AMBULATORY_CARE_PROVIDER_SITE_OTHER): Payer: 59

## 2019-11-03 ENCOUNTER — Other Ambulatory Visit: Payer: Self-pay

## 2019-11-03 ENCOUNTER — Ambulatory Visit: Payer: 59 | Admitting: Pulmonary Disease

## 2019-11-03 ENCOUNTER — Ambulatory Visit: Payer: 59 | Admitting: Adult Health

## 2019-11-03 VITALS — BP 126/75 | HR 82 | Temp 97.1°F | Ht 68.0 in | Wt 348.6 lb

## 2019-11-03 DIAGNOSIS — G4733 Obstructive sleep apnea (adult) (pediatric): Secondary | ICD-10-CM | POA: Diagnosis not present

## 2019-11-03 DIAGNOSIS — J1282 Pneumonia due to coronavirus disease 2019: Secondary | ICD-10-CM

## 2019-11-03 DIAGNOSIS — J4550 Severe persistent asthma, uncomplicated: Secondary | ICD-10-CM

## 2019-11-03 DIAGNOSIS — U071 COVID-19: Secondary | ICD-10-CM

## 2019-11-03 DIAGNOSIS — J9611 Chronic respiratory failure with hypoxia: Secondary | ICD-10-CM | POA: Diagnosis not present

## 2019-11-03 NOTE — Assessment & Plan Note (Signed)
Weight loss encouraged 

## 2019-11-03 NOTE — Patient Instructions (Addendum)
Set up for BIPAP , wear every night all night .  Work on healthy weight .  Chest xray today   Set up for HRCT Chest in 4 weeks  PFT as planned next month .  Continue on Breo 1 puff daily, rinse after use Albuterol inhaler or nebulizer as needed for wheezing. Continue on Oxygen 2l/m .  Follow up in 4 weeks with Dr. Vassie Loll  And As needed  - after CT chest .  Please contact office for sooner follow up if symptoms do not improve or worsen or seek emergency care

## 2019-11-03 NOTE — Assessment & Plan Note (Signed)
Covid 17 August 2019.-Patient had a critical illness with acute respiratory distress along with a post Covid pneumonitis/pneumonia.  Patient is clinically improving.  Chest x-ray today does show improvement in his bilateral infiltrates.  Patient has an upcoming CT chest next month.

## 2019-11-03 NOTE — Assessment & Plan Note (Signed)
Very severe sleep apnea patient education given.  Patient is to begin nocturnal BiPAP. Patient is to begin BiPAP at 18/14 cm H2O  Plan  Patient Instructions  Set up for BIPAP , wear every night all night .  Work on healthy weight .  Chest xray today   Set up for HRCT Chest in 4 weeks  PFT as planned next month .  Continue on Breo 1 puff daily, rinse after use Albuterol inhaler or nebulizer as needed for wheezing. Continue on Oxygen 2l/m .  Follow up in 4 weeks with Dr. Vassie Loll  And As needed  - after CT chest .  Please contact office for sooner follow up if symptoms do not improve or worsen or seek emergency care     .

## 2019-11-03 NOTE — Assessment & Plan Note (Signed)
Currently stable on present regimen no changes  Plan  Patient Instructions  Set up for BIPAP , wear every night all night .  Work on healthy weight .  Chest xray today   Set up for HRCT Chest in 4 weeks  PFT as planned next month .  Continue on Breo 1 puff daily, rinse after use Albuterol inhaler or nebulizer as needed for wheezing. Continue on Oxygen 2l/m .  Follow up in 4 weeks with Dr. Vassie Loll  And As needed  - after CT chest .  Please contact office for sooner follow up if symptoms do not improve or worsen or seek emergency care

## 2019-11-03 NOTE — Assessment & Plan Note (Signed)
Decreased oxygen demands.  Patient continue on O2 to keep O2 saturations greater than 88 to 90%.

## 2019-11-03 NOTE — Progress Notes (Signed)
@Patient  ID: , male    DOB: 1966-08-07, 54 y.o.   MRN: [id number]  Chief Complaint  Patient presents with  . Follow-up    Referring provider: [id number], MD  HPI: 54 year old male morbidly obese seen for pulmonary consult August 04, 2019 to establish for severe persistent asthma and obstructive sleep apnea Medical history significant for hypertension and morbid obesity Critical illness -COVID-19 08/25/2019 with acute respiratory distress and Covid pneumonia  TEST/EVENTS :  /2020 spirometry-ratio 87, FEV1 1.97-54%, FVC 2.2 648% 09/2018 spirometry-ratio 80, FEV1 86%, FVC 84% 09/2018 eos 0  Home sleep study April 2019 showed severe sleep apnea with REI 73/hour severe desaturations with minimum O2 saturation at 58%.   11/03/2019 Follow up : COVID 19 PNA , Asthma , O2 RF , OSA  Patient returns for a 6-week follow-up.  Patient had COVID-19 late November with severe cough shortness of breath and hypoxemia.  He was admitted with acute respiratory failure requiring high flow oxygen up to 15 L.  He was found to have a viral pneumonitis.  He was treated with Decadron and remdesivir.  He also received Actemra since discharge patient has been slowly improving at home making slow recovery.  Was able to titrate oxygen down to 2 L at rest and 4 L with activity.  He is receiving physical therapy at home.  Chest x-ray last visit showed diffuse bilateral opacities.  Patient remains on Breo daily.  He is on Dupixent injections.  Since last visit patient says he is slowly improving his breathing is getting better.  He is trying to get around to be more active.  He still gets short of breath with heavy activity.  He has been able to decrease his oxygen demands.  At rest sometimes he says he does not need oxygen.  And is able to keep his oxygen levels up on just 2 L of oxygen with activity..   Patient has known severe obstructive sleep apnea.  Has been intolerant to CPAP in the past.  He was  set up for a repeat sleep study.  Sleep study done on 10/25/2019 showed severe obstructive sleep apnea with an AHI at 153/hour and SPO2 of 79%.  Patient required BiPAP support at 18/14 cm H2O.  Patient says he was unable to tolerate CPAP during the study.  But did tolerate BiPAP well.  We discussed his underlying sleep study results and patient education given on severe sleep apnea potential complications.  Discussed BiPAP.  Patient is in favor of beginning BiPAP.   Patient is on a diet and has lost a few pounds.  He says it is making him feel some better. Allergies  Allergen Reactions  . Lisinopril Other (See Comments)    Muscle aches and fatigue.    Immunization History  Administered Date(s) Administered  . Influenza,inj,Quad PF,6+ Mos 08/07/2016, 08/04/2019  . Influenza,inj,quad, With Preservative 07/31/2015  . Pneumococcal Polysaccharide-23 03/16/2014  . Td 05/20/1986  . Tdap 05/20/2012, 04/03/2019    Past Medical History:  Diagnosis Date  . Asthma   . Claustrophobia   . Depression   . Hypertension   . Kidney stone    YRS AGO   PASSED ON OWN     Tobacco History: Social History   Tobacco Use  Smoking Status Former Smoker  . Packs/day: 1.00  . Years: 6.00  . Pack years: 6.00  . Types: Cigarettes  . Quit date: 09/28/1990  . Years since quitting: 29.1  Smokeless Tobacco Never Used  Counseling given: Not Answered   Outpatient Medications Prior to Visit  Medication Sig Dispense Refill  . albuterol (PROVENTIL HFA;VENTOLIN HFA) 108 (90 Base) MCG/ACT inhaler Inhale 2 puffs into the lungs every 6 (six) hours as needed for shortness of breath. 1 Inhaler 2  . amLODipine (NORVASC) 10 MG tablet Take 10 mg by mouth daily.    Marland Kitchen BREO ELLIPTA 200-25 MCG/INH AEPB INHALE 1 PUFF INTO THE LUNGS DAILY. RINSE, GARGLE, AND SPIT AFTER USE. (Patient taking differently: Inhale 1 puff into the lungs daily. Rinse, gargle, and spit after use.) 60 each 3  . DUPIXENT 300 MG/2ML SOSY INJECT 600MG   (2 SYRINGES)  SUBCUTANEOUSLY ON DAY 1,  300MG  (1 SYRINGE) DAY 15,  THEN EVERY OTHER WEEK  THEREAFTER (Patient taking differently: Inject 300 mg into the skin every 14 (fourteen) days. Thursdays) 4 mL 11  . furosemide (LASIX) 20 MG tablet Take 40 mg by mouth daily.     ipratropium-albuterol (DUONEB) 0.5-2.5 (3) MG/3ML SOLN TAKE 3 MLS BY NEBULIZATION EVERY 6 (SIX) HOURS AS NEEDED. (Patient taking differently: Take 3 mLs by nebulization every 6 (six) hours as needed (shortness of breath). ) 180 mL 0  . pimecrolimus (ELIDEL) 1 % cream Apply topically 2 (two) times daily. (Patient taking differently: Apply 1 application topically as needed. ) 30 g 0  . sertraline (ZOLOFT) 50 MG tablet Take 50 mg by mouth daily.     . valsartan (DIOVAN) 320 MG tablet Take 320 mg by mouth daily.     . Olopatadine HCl (PAZEO) 0.7 % SOLN Place 1 drop into both eyes daily as needed. 2.5 mL 3  . Ascorbic Acid (VITAMIN C PO) Take 1 tablet by mouth daily.    . Multiple Vitamins-Minerals (ZINC PO) Take 1 tablet by mouth daily.     Facility-Administered Medications Prior to Visit  Medication Dose Route Frequency Provider Last Rate Last Admin  . dupilumab (DUPIXENT) prefilled syringe 300 mg  300 mg Subcutaneous Q14 Days 02-28-1980, MD   300 mg at 08/10/19 1716     Review of Systems:   Constitutional:   No  weight loss, night sweats,  Fevers, chills,  +fatigue, or  lassitude.  HEENT:   No headaches,  Difficulty swallowing,  Tooth/dental problems, or  Sore throat,                No sneezing, itching, ear ache, nasal congestion, post nasal drip,   CV:  No chest pain,  Orthopnea, PND, swelling in lower extremities, anasarca, dizziness, palpitations, syncope.   GI  No heartburn, indigestion, abdominal pain, nausea, vomiting, diarrhea, change in bowel habits, loss of appetite, bloody stools.   Resp:   No chest wall deformity  Skin: no rash or lesions.  GU: no dysuria, change in color of urine, no urgency  or frequency.  No flank pain, no hematuria   MS:  No joint pain or swelling.  No decreased range of motion.  No back pain.    Physical Exam  BP 126/75 (BP Location: Left Arm, Cuff Size: Normal)   Pulse 82   Temp (!) 97.1 F (36.2 C) (Temporal)   Ht 5\' 8"  (1.727 m)   Wt (!) 348 lb 9.6 oz (158.1 kg)   SpO2 98% Comment: 2 liters  BMI 53.00 kg/m   GEN: A/Ox3; pleasant , NAD, BMI 53   HEENT:  [place]/AT,   NOSE-clear, THROAT-clear, no lesions, no postnasal drip or exudate noted.   NECK:  Supple w/ fair  ROM; no JVD; normal carotid impulses w/o bruits; no thyromegaly or nodules palpated; no lymphadenopathy.    RESP  Clear  P & A; w/o, wheezes/ rales/ or rhonchi. no accessory muscle use, no dullness to percussion  CARD:  RRR, no m/r/g, tr peripheral edema, pulses intact, no cyanosis or clubbing.  GI:   Soft & nt; nml bowel sounds; no organomegaly or masses detected.   Musco: Warm bil, no deformities or joint swelling noted.   Neuro: alert, no focal deficits noted.    Skin: Warm, no lesions or rashes    Lab Results:  CBC   BMET   BNP   ProBNP No results found for: PROBNP  Imaging: DG Chest 1 View  Result Date: 11/03/2019 CLINICAL DATA:  Pneumonia.  History of COVID. EXAM: CHEST  1 VIEW COMPARISON:  09/18/2019. FINDINGS: Mediastinum hilar structures stable. Heart size stable. Persistent bilateral pulmonary infiltrates, significantly improved from prior exam. No pleural effusion or pneumothorax. Thoracic spine scoliosis. IMPRESSION: Persistent bilateral pulmonary infiltrates, significantly improved from prior exam. Electronically Signed   By: [place]   On: 11/03/2019 12:20   SLEEP STUDY DOCUMENTS  Result Date: 10/30/2019 Ordered by an unspecified provider.     No flowsheet data found.  No results found for: NITRICOXIDE      Assessment & Plan:   Asthma Currently stable on present regimen no changes  Plan  Patient Instructions  Set up for BIPAP ,  wear every night all night .  Work on healthy weight .  Chest xray today   Set up for HRCT Chest in 4 weeks  PFT as planned next month .  Continue on Breo 1 puff daily, rinse after use Albuterol inhaler or nebulizer as needed for wheezing. Continue on Oxygen 2l/m .  Follow up in 4 weeks with Dr. Elsworth Soho  And As needed  - after CT chest .  Please contact office for sooner follow up if symptoms do not improve or worsen or seek emergency care       OSA (obstructive sleep apnea) Very severe sleep apnea patient education given.  Patient is to begin nocturnal BiPAP. Patient is to begin BiPAP at 18/14 cm H2O  Plan  Patient Instructions  Set up for BIPAP , wear every night all night .  Work on healthy weight .  Chest xray today   Set up for HRCT Chest in 4 weeks  PFT as planned next month .  Continue on Breo 1 puff daily, rinse after use Albuterol inhaler or nebulizer as needed for wheezing. Continue on Oxygen 2l/m .  Follow up in 4 weeks with Dr. Elsworth Soho  And As needed  - after CT chest .  Please contact office for sooner follow up if symptoms do not improve or worsen or seek emergency care     .  Pneumonia due to COVID-19 virus Covid 17 August 2019.-Patient had a critical illness with acute respiratory distress along with a post Covid pneumonitis/pneumonia.  Patient is clinically improving.  Chest x-ray today does show improvement in his bilateral infiltrates.  Patient has an upcoming CT chest next month.   Chronic respiratory failure with hypoxia (HCC) Decreased oxygen demands.  Patient continue on O2 to keep O2 saturations greater than 88 to 90%.  Morbid obesity ([place]) Weight loss encouraged     Rexene Edison, NP 11/03/2019

## 2019-11-03 NOTE — Progress Notes (Signed)
cxr 

## 2019-11-24 ENCOUNTER — Telehealth: Payer: Self-pay | Admitting: Adult Health

## 2019-11-24 DIAGNOSIS — U071 COVID-19: Secondary | ICD-10-CM

## 2019-11-24 NOTE — Telephone Encounter (Signed)
Called and spoke with Patient.  Patient stated he was told someone would call him today to schedule a CT scan. HRCT was not ordered at [facility] 11/03/19.  Explained someone will contact him to schedule CT. HRCT in [facility] note was not ordered. Order placed for 1-2 weeks. Nothing further at this time.  Per TP, 11/03/19-  Instructions    Return in about 4 weeks (around 12/01/2019) for Follow up with Dr. Vassie Loll. Set up for BIPAP , wear every night all night .  Work on healthy weight .  Chest xray today   Set up for HRCT Chest in 4 weeks  PFT as planned next month .  Continue on Breo 1 puff daily, rinse after use Albuterol inhaler or nebulizer as needed for wheezing. Continue on Oxygen 2l/m .  Follow up in 4 weeks with Dr. Vassie Loll  And As needed  - after CT chest .  Please contact office for sooner follow up if symptoms do not improve or worsen or seek emergency care

## 2019-11-27 ENCOUNTER — Telehealth: Payer: Self-pay | Admitting: Pulmonary Disease

## 2019-11-27 ENCOUNTER — Other Ambulatory Visit (HOSPITAL_COMMUNITY)
Admission: RE | Admit: 2019-11-27 | Discharge: 2019-11-27 | Disposition: A | Discharge status: Home / Self Care | Payer: 59 | Source: Ambulatory Visit | Attending: Adult Health | Admitting: Adult Health

## 2019-11-27 DIAGNOSIS — J9611 Chronic respiratory failure with hypoxia: Secondary | ICD-10-CM

## 2019-11-27 DIAGNOSIS — Z01812 Encounter for preprocedural laboratory examination: Secondary | ICD-10-CM | POA: Insufficient documentation

## 2019-11-27 DIAGNOSIS — Z20822 Contact with and (suspected) exposure to covid-19: Secondary | ICD-10-CM | POA: Insufficient documentation

## 2019-11-27 LAB — SARS CORONAVIRUS 2 (TAT 6-24 HRS): SARS Coronavirus 2: NEGATIVE

## 2019-11-27 NOTE — Telephone Encounter (Signed)
OK to dc O2 concentrator

## 2019-11-27 NOTE — Telephone Encounter (Signed)
Called and spoke to pt and his wife. Pt is requesting to d/c his O2 concentrator but keep his portable tanks. He is requesting this because insurance is billing them $25 per month for the concentrator. Pt states they spoke with the DME company and they advised insurance is charging for the concentrator but paying 100% for the portable tanks. Will need to call [facility] in the morning to confirm this. Pt states he isnt using the O2 much except when showering and when out of the house with moderate activity. Pt states he maintains above 90% spo2 most of the time. Pt has 3-4 tanks at home.    Dr. Vassie Loll, please advise if you are ok d/c'ing the O2 concentrator.

## 2019-11-28 NOTE — Telephone Encounter (Signed)
Spoke with pt, advised him that I sent order to d/c the POC. Pt understood and nothing further is needed.

## 2019-12-01 ENCOUNTER — Ambulatory Visit (INDEPENDENT_AMBULATORY_CARE_PROVIDER_SITE_OTHER): Payer: 59 | Admitting: Pulmonary Disease

## 2019-12-01 ENCOUNTER — Other Ambulatory Visit: Payer: Self-pay

## 2019-12-01 DIAGNOSIS — U071 COVID-19: Secondary | ICD-10-CM | POA: Diagnosis not present

## 2019-12-01 DIAGNOSIS — J4550 Severe persistent asthma, uncomplicated: Secondary | ICD-10-CM

## 2019-12-01 DIAGNOSIS — J9611 Chronic respiratory failure with hypoxia: Secondary | ICD-10-CM

## 2019-12-01 DIAGNOSIS — J1282 Pneumonia due to coronavirus disease 2019: Secondary | ICD-10-CM | POA: Diagnosis not present

## 2019-12-01 LAB — PULMONARY FUNCTION TEST
DL/VA % pred: 105 %
DL/VA: 4.68 ml/min/mmHg/L
DLCO unc % pred: 94 %
DLCO unc: 26.25 ml/min/mmHg
FEF 25-75 Post: 3.63 L/sec
FEF 25-75 Pre: 3.27 L/sec
FEF2575-%Change-Post: 11 %
FEF2575-%Pred-Post: 111 %
FEF2575-%Pred-Pre: 100 %
FEV1-%Change-Post: 4 %
FEV1-%Pred-Post: 86 %
FEV1-%Pred-Pre: 82 %
FEV1-Post: 3.22 L
FEV1-Pre: 3.07 L
FEV1FVC-%Change-Post: 0 %
FEV1FVC-%Pred-Pre: 106 %
FEV6-%Change-Post: 4 %
FEV6-%Pred-Post: 84 %
FEV6-%Pred-Pre: 80 %
FEV6-Post: 3.9 L
FEV6-Pre: 3.73 L
FEV6FVC-%Change-Post: 0 %
FEV6FVC-%Pred-Post: 103 %
FEV6FVC-%Pred-Pre: 103 %
FVC-%Change-Post: 4 %
FVC-%Pred-Post: 81 %
FVC-%Pred-Pre: 77 %
FVC-Post: 3.9 L
FVC-Pre: 3.73 L
Post FEV1/FVC ratio: 82 %
Post FEV6/FVC ratio: 100 %
Pre FEV1/FVC ratio: 82 %
Pre FEV6/FVC Ratio: 100 %
RV % pred: 84 %
RV: 1.69 L
TLC % pred: 85 %
TLC: 5.75 L

## 2019-12-01 NOTE — Progress Notes (Signed)
Full PFT performed today. °

## 2019-12-11 ENCOUNTER — Ambulatory Visit (INDEPENDENT_AMBULATORY_CARE_PROVIDER_SITE_OTHER)
Admission: RE | Admit: 2019-12-11 | Discharge: 2019-12-11 | Disposition: A | Discharge status: Home / Self Care | Payer: 59 | Source: Ambulatory Visit | Attending: Adult Health | Admitting: Adult Health

## 2019-12-11 ENCOUNTER — Other Ambulatory Visit: Payer: Self-pay

## 2019-12-11 DIAGNOSIS — J1282 Pneumonia due to coronavirus disease 2019: Secondary | ICD-10-CM | POA: Diagnosis not present

## 2019-12-13 ENCOUNTER — Telehealth: Payer: Self-pay | Admitting: Pulmonary Disease

## 2019-12-13 NOTE — Telephone Encounter (Addendum)
Spoke with pt, he reports his BIPAP machine is blowing out too much air and can only wear the mask for 45 minutes. He states his pressure goes to 15cm and that's where he thinks it is too much. He has tried to get used to it but no success. I called [facility] to obtain a download for RA to review. They will fax over the download. Will await fax.

## 2019-12-18 ENCOUNTER — Telehealth (INDEPENDENT_AMBULATORY_CARE_PROVIDER_SITE_OTHER): Payer: 59 | Admitting: Adult Health

## 2019-12-18 ENCOUNTER — Encounter: Payer: Self-pay | Admitting: Adult Health

## 2019-12-18 DIAGNOSIS — J8410 Pulmonary fibrosis, unspecified: Secondary | ICD-10-CM | POA: Diagnosis not present

## 2019-12-18 DIAGNOSIS — G4733 Obstructive sleep apnea (adult) (pediatric): Secondary | ICD-10-CM

## 2019-12-18 NOTE — Patient Instructions (Addendum)
Continue on  BIPAP , wear every night all night .  Adjust BIPAP pressure Auto IPAP 16/ EPAP 12 cm H2O.  Set up Overnight oximetry test on BIPAP .  Work on healthy weight .  Continue on Breo 1 puff daily, rinse after use Albuterol inhaler or nebulizer as needed for wheezing..  May return to work on 01/01/20 .  Follow up in 3 months with Dr. Vassie Loll  Or Isabel Ardila NP and As needed   Please contact office for sooner follow up if symptoms do not improve or worsen or seek emergency care

## 2019-12-18 NOTE — Telephone Encounter (Signed)
Unable to find download, patient still not loaded in [facility], will call DME company Tuesday as it is after 5pm

## 2019-12-18 NOTE — Progress Notes (Signed)
Virtual Visit via Video Note  I connected with Don [name] on 12/18/19 at 10:30 AM EDT by a video enabled telemedicine application and verified that I am speaking with the correct person using two identifiers.  Location: Patient: Home  Provider: Home    I discussed the limitations of evaluation and management by telemedicine and the availability of in person appointments. The patient expressed understanding and agreed to proceed.  History of Present Illness: 54 year old male morbidly obese seen for pulmonary consult August 04, 2019 to establish for severe persistent asthma and obstructive sleep apnea Critical illness with COVID-19 August 25, 2019 with acute respiratory distress syndrome and COVID-19 pneumonia Medical history significant for hypertension and morbid obesity  Today's virtual video visit is a 6-week follow-up for asthma and oxygen dependent respiratory failure, obstructive sleep apnea Patient had COVID-19 in late November 2020 with severe shortness of breath and hypoxemia he was admitted with acute respiratory failure requiring high flow oxygen.  Was found to have a viral pneumonitis.  He was treated with Decadron, remdesivir, Actemra.  He was discharged on oxygen.  And has been slowly having gradual improvement and has been able to decrease oxygen demands.  He did require physical therapy at home.  Chest x-ray has shown persistent bilateral opacities.  He is also on Breo daily and Dupixent injections for his chronic asthma.  High-resolution CT chest was done on December 11, 2019 that showed mild to moderate parenchymal architectural distortion with patchy areas of groundglass affecting all lobes of both lungs.,  Mild associated bronchiectasis.  There was no honeycombing or reticulation.  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 Pulmonary function testing done showed normal lung function with FEV1 86%, ratio 82, FVC 81%, no significant  bronchodilator response, DLCO 94%. We reviewed his CT and PFT results.  Patient says he continues to make some gradual improvements he has weaned off of oxygen.  His O2 saturations remain above 90% on room air at rest and with activity.  He is starting to increase his activity.  He is not back to his baseline prior to Covid but is trying to do more activities.  He does get winded with heavy activity such as going up stairs or lifting something heavy. He has not returned back to work.  His job requires heavy physical labor.  Patient has known obstructive sleep apnea.  He was set up for a sleep study done on October 25, 2019 that showed very severe sleep apnea with an AHI at 153/hour, SPO2 low 79%.  CPAP was ineffective.  Patient required BiPAP for optimal control at 18/14.  He had borderline oxygen levels.  Recommendations for an overnight oximetry test on BiPAP.  And if desaturations will need a full night BiPAP titration study. Patient has started on BiPAP.  And says he is doing okay on BiPAP.  He tries to wear it each night.  Gets in at least 4 to 6 hours each night.  BiPAP download shows excellent compliance with daily average usage around 5 hours.  AHI 3.2.  Patient is on EPAP 14 cm H2O.  And IPAP 18 cm H2O.  He says the pressure feels very high at times and is uncomfortable.  Wants to know if the pressure can be decreased slightly.      Observations/Objective: 12/18/2019 -appears well in no apparent distress.  Speaks in full sentences.   2020 spirometry-ratio 87, FEV1 1.97-54%, FVC 2.2 648% 09/2018 spirometry-ratio 80, FEV1 86%, FVC 84% 09/2018 eos 0  Home sleep study April 2019 showed severe sleep apnea with REI 73/hour severe desaturations with minimum O2 saturation at 58%.    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  CT chest shows no honeycombing or reticulations  more consistent with a postinflammatory fibrosis.  PFTs are reassuring with normal lung function and normal DLCO.  Patient is to advance activity as tolerated.  Severe persistent asthma.  Patient is doing well on Breo and Dupixent.  Very severe obstructive sleep apnea-patient is doing well starting out on BiPAP.  BiPAP shows significant improvement in number of events.  We will adjust BiPAP pressure slightly for comfort. Check overnight oximetry on BiPAP.  As patient had some borderline hypoxemia during BiPAP study.  Physical deconditioning.  Patient's advance activity as tolerated.  He may return to work on January 01, 2020.  Plan  Patient Instructions  Continue on  BIPAP , wear every night all night .  Adjust BIPAP pressure Auto IPAP 16/ EPAP 12 cm H2O.  Set up Overnight oximetry test on BIPAP .  Work on healthy weight .  Continue on Breo 1 puff daily, rinse after use Albuterol inhaler or nebulizer as needed for wheezing..  May return to work on 01/01/20 .  Follow up in 3 months with Dr. Vassie Loll  Or Parrett NP and As needed   Please contact office for sooner follow up if symptoms do not improve or worsen or seek emergency care       Follow Up Instructions:    I discussed the assessment and treatment plan with the patient. The patient was provided an opportunity to ask questions and all were answered. The patient agreed with the plan and demonstrated an understanding of the instructions.   The patient was advised to call back or seek an in-person evaluation if the symptoms worsen or if the condition fails to improve as anticipated.  I provided 35 vminutes of non-face-to-face time during this encounter.   Rubye Oaks, NP

## 2019-12-19 ENCOUNTER — Other Ambulatory Visit: Payer: Self-pay | Admitting: *Deleted

## 2019-12-19 ENCOUNTER — Telehealth: Payer: Self-pay | Admitting: Pulmonary Disease

## 2019-12-19 DIAGNOSIS — G4733 Obstructive sleep apnea (adult) (pediatric): Secondary | ICD-10-CM

## 2019-12-19 NOTE — Telephone Encounter (Signed)
Pt had visit with Rubye Oaks, NP, on 3.22.21 and pt's bipap was discussed. Called [facility] and pts DL can be obtained in [facility]. TP was able to view DL on 7/62 visit. Nothing further needed. Will sign off.

## 2019-12-19 NOTE — Addendum Note (Signed)
Addended by: Jaynee Eagles C on: 12/19/2019 10:04 AM   Modules accepted: Orders

## 2019-12-19 NOTE — Telephone Encounter (Signed)
Patient wife came in and dropped off disability forms - fwd to [employer] via interoffice mail -pr

## 2019-12-20 ENCOUNTER — Encounter: Payer: Self-pay | Admitting: Adult Health

## 2019-12-21 ENCOUNTER — Other Ambulatory Visit: Payer: Self-pay | Admitting: Adult Health

## 2019-12-21 DIAGNOSIS — G4733 Obstructive sleep apnea (adult) (pediatric): Secondary | ICD-10-CM

## 2019-12-22 ENCOUNTER — Other Ambulatory Visit: Payer: Self-pay | Admitting: Adult Health

## 2019-12-22 DIAGNOSIS — G4733 Obstructive sleep apnea (adult) (pediatric): Secondary | ICD-10-CM

## 2019-12-25 ENCOUNTER — Other Ambulatory Visit: Payer: Self-pay | Admitting: Allergy

## 2019-12-25 DIAGNOSIS — J4550 Severe persistent asthma, uncomplicated: Secondary | ICD-10-CM

## 2019-12-26 ENCOUNTER — Telehealth: Payer: Self-pay | Admitting: Adult Health

## 2019-12-26 NOTE — Telephone Encounter (Signed)
ATC pt, VM box is full. WCB. 

## 2019-12-26 NOTE — Telephone Encounter (Signed)
Pt called back, please return call  

## 2019-12-26 NOTE — Telephone Encounter (Signed)
Response from [facility] O2 order placed on 12/22/19:   ===View-only below this line=== ----- Message ----- From: Durward Fortes Sent: 12/25/2019   5:09 PM EDT To: Druscilla Brownie, Antionette Fairy Subject: RE: Don [name],   The order in the system is for overnight on bilevel. we contacted the patient to schedule, and he stated he had already completed an overnight oximetry on bilevel. We are cancelling our order.

## 2019-12-26 NOTE — Telephone Encounter (Signed)
Spoke with pt's wife and she is requesting ONO results. There was some confusion because the pt receives his oxygen from [facility] and his bipap is serviced through [facility] [facility].   The ONO was given by [facility] and the order for oxygen at night should go to [place].   All orders for Bipap setting changes or supplies should go to [facility]. The setting change should be sent to [facility] from the order dated 3/26.   [facility] is going to send the ONO results to our fax machine. Will await results and resend order to [facility] for bipap setting change. Will hold in triage until ONO received.   If pt qualifies for oxygen, the order needs to be sent to [facility] to have it bled into his bipap.  Problem list updated to reflect correct DME

## 2019-12-28 NOTE — Telephone Encounter (Signed)
These results were received last week. TP - do you remember seeing this report?

## 2020-01-01 ENCOUNTER — Telehealth: Payer: Self-pay | Admitting: Pulmonary Disease

## 2020-01-01 DIAGNOSIS — J4550 Severe persistent asthma, uncomplicated: Secondary | ICD-10-CM

## 2020-01-01 MED ORDER — BREO ELLIPTA 200-25 MCG/INH IN AEPB: 1.0000 | INHALATION_SPRAY | Freq: Every day | RESPIRATORY_TRACT | 3 refills | Status: DC

## 2020-01-01 NOTE — Telephone Encounter (Signed)
Spoke with pt's wife and advised her rx for Virgel Bouquet was sent to [facility]. Nothing further is needed.

## 2020-01-01 NOTE — Telephone Encounter (Signed)
Pt aware needs 2lpm o2 with BIPAP and we have already placed an order for this on 3.26.21.

## 2020-01-01 NOTE — Telephone Encounter (Signed)
Yes order for O2 at 2l/m with BIPAP done .

## 2020-01-08 ENCOUNTER — Telehealth: Payer: Self-pay | Admitting: Pulmonary Disease

## 2020-01-08 DIAGNOSIS — G4734 Idiopathic sleep related nonobstructive alveolar hypoventilation: Secondary | ICD-10-CM

## 2020-01-08 DIAGNOSIS — G4733 Obstructive sleep apnea (adult) (pediatric): Secondary | ICD-10-CM

## 2020-01-08 NOTE — Telephone Encounter (Signed)
I called [facility] to check status of oxygen. I spoke with [place] and she states they never sent the order. After researching and speaking with wife, the orders were to be sent to [facility] because they did the ONO and they currently service his bipap. The order for bipap setting change and order for oxygen should be going to [facility]. Order placed, TP please sign.  She is also asking about a form that was supposed to be filled out by Dr. Vassie Loll or TP. I will look in their boxes to see if I could find form. She states the form was from [employer].

## 2020-01-11 NOTE — Telephone Encounter (Signed)
Sent priority message to [facility] Tobe Sos

## 2020-01-11 NOTE — Telephone Encounter (Signed)
Pt calling back upset b/c he does not have an update on his faxed form or bipap machine. States he would like a call back today or he will seek another provider. Please advise.

## 2020-01-11 NOTE — Telephone Encounter (Signed)
Order was placed on 4/12 but looking at the order, it has in there the DME as [facility] but then at the bottom, there is a comment that says [facility]. Checked the referrals tab and saw that the order was sent to [facility] and is still pending review.   Called and spoke with pt letting him know the info I saw in regards to his O2. Stated to pt that I was also going to check with [facility] to see if they might have a further update for [place] in regards to him receiving his O2 and pt verbalized understanding. Also stated to pt that after his wife dropped off the disability forms on 3/23, we sent them to [employer]. Stated to him that I would check if we had received an update if the forms had been sent back to [place] by [employer] for either Tammy Parrett or Dr. Vassie Loll to sign.   [facility], can you please see if there is any update on pt receiving the O2 from [facility] and [place], do you have an update in regards to pt's disability forms?

## 2020-01-12 NOTE — Telephone Encounter (Signed)
Received message back from [employer] and they have some questions for the pt and have tried to reach him and will try to reach him again Don [name]

## 2020-01-22 ENCOUNTER — Telehealth: Payer: Self-pay | Admitting: Pulmonary Disease

## 2020-01-22 NOTE — Telephone Encounter (Signed)
Spoke with pt. He is requesting a note to return to work on 01/29/2020.  Tammy - please advise as you were the last provider to see the pt. Thank you!

## 2020-01-22 NOTE — Telephone Encounter (Signed)
I have created a letter stating that pt is okay to return to work on 01/29/20. This has been sent to pt via mychart. If pt needs to have the letter printed out, we can also do that for him too.  Attempted to call pt but unable to reach and unable to leave a VM due to no mailbox being set up. Will try to call pt back later.

## 2020-01-22 NOTE — Telephone Encounter (Signed)
Yes that is fine

## 2020-01-24 NOTE — Telephone Encounter (Signed)
ATC patient x2 unable to leave VM due to it not being set up. Will try again

## 2020-01-25 NOTE — Telephone Encounter (Signed)
ATC patient x 3 and unable to leave VM. Per protocol I will close this encounter

## 2020-01-30 NOTE — Telephone Encounter (Signed)
I have not received the disability forms back from [employer] as of yet? They probably need to contact [employer] [phone] -pr

## 2020-01-30 NOTE — Telephone Encounter (Signed)
Called and spoke with pt about the message received from [place] about his O2 and also from [place] about the disability forms. Pt stated all has been taken care of now. Nothing further needed.

## 2020-04-27 ENCOUNTER — Other Ambulatory Visit: Payer: Self-pay | Admitting: Pulmonary Disease

## 2020-04-27 DIAGNOSIS — J4550 Severe persistent asthma, uncomplicated: Secondary | ICD-10-CM

## 2020-06-10 ENCOUNTER — Telehealth: Payer: Self-pay | Admitting: Pulmonary Disease

## 2020-06-10 DIAGNOSIS — J4550 Severe persistent asthma, uncomplicated: Secondary | ICD-10-CM

## 2020-06-10 MED ORDER — BREO ELLIPTA 200-25 MCG/INH IN AEPB: 1.0000 | INHALATION_SPRAY | Freq: Every day | RESPIRATORY_TRACT | 3 refills | Status: DC

## 2020-06-10 NOTE — Telephone Encounter (Signed)
Spoke with [place] Pate[place] wife regarding refill for [facility] sent into his pharmacy.Madaline Savage is aware of refill sent . Nothing else further needed.

## 2020-10-16 ENCOUNTER — Other Ambulatory Visit: Payer: Self-pay | Admitting: Pulmonary Disease

## 2020-10-16 DIAGNOSIS — J4550 Severe persistent asthma, uncomplicated: Secondary | ICD-10-CM

## 2020-10-18 ENCOUNTER — Telehealth: Payer: Self-pay | Admitting: Pulmonary Disease

## 2020-10-18 MED ORDER — ALBUTEROL SULFATE HFA 108 (90 BASE) MCG/ACT IN AERS: 2.0000 | INHALATION_SPRAY | Freq: Four times a day (QID) | RESPIRATORY_TRACT | 2 refills | Status: DC | PRN

## 2020-10-18 NOTE — Telephone Encounter (Signed)
Called and spoke with pt and he stated that he was told to follow up in 1 year.  With his [facility] he would like to come in before then.  appt scheduled with RA for 2/1. Pt is aware and refill of albuterol inhaler has been sent in.

## 2020-10-29 ENCOUNTER — Encounter: Payer: Self-pay | Admitting: Pulmonary Disease

## 2020-10-29 ENCOUNTER — Other Ambulatory Visit: Payer: Self-pay

## 2020-10-29 ENCOUNTER — Ambulatory Visit (INDEPENDENT_AMBULATORY_CARE_PROVIDER_SITE_OTHER): Payer: Self-pay | Admitting: Pulmonary Disease

## 2020-10-29 VITALS — BP 158/100 | HR 84 | Temp 97.3°F | Ht 68.0 in | Wt 346.5 lb

## 2020-10-29 DIAGNOSIS — H9312 Tinnitus, left ear: Secondary | ICD-10-CM

## 2020-10-29 DIAGNOSIS — G4733 Obstructive sleep apnea (adult) (pediatric): Secondary | ICD-10-CM

## 2020-10-29 DIAGNOSIS — J9611 Chronic respiratory failure with hypoxia: Secondary | ICD-10-CM

## 2020-10-29 DIAGNOSIS — J4550 Severe persistent asthma, uncomplicated: Secondary | ICD-10-CM

## 2020-10-29 DIAGNOSIS — I10 Essential (primary) hypertension: Secondary | ICD-10-CM

## 2020-10-29 MED ORDER — ALBUTEROL SULFATE HFA 108 (90 BASE) MCG/ACT IN AERS: 2.0000 | INHALATION_SPRAY | Freq: Four times a day (QID) | RESPIRATORY_TRACT | 6 refills | Status: DC | PRN

## 2020-10-29 MED ORDER — BREO ELLIPTA 200-25 MCG/INH IN AEPB: 1.0000 | INHALATION_SPRAY | Freq: Every day | RESPIRATORY_TRACT | 6 refills | Status: DC

## 2020-10-29 NOTE — Assessment & Plan Note (Signed)
Blood pressure is high today.  Will emphasize compliance with his medications. He has persistent left ear humming, will refer him to ENT for tinnitus, no ear wax on exam today

## 2020-10-29 NOTE — Progress Notes (Signed)
   Subjective:    Patient ID: Don [name], male    DOB: 1965/10/11, 55 y.o.   MRN: [id number]  HPI  55 year old morbidly obese man for follow-up of severe persistent asthma and OSA He had Covid ARDS 07/2019 -  Last virtual visit 11/2019 He was maintained on BiPAP 18/14 and since he complained of increased pressure this was decreased to 16/12. Unfortunately he could not tolerate the high pressure in the machine, DM he was not very responsive and he finally returned the machine 02/2020.  He also reports a rash over his face while using the full facemask.  He continues to complain of shortness of breath, intermittent wheezing.  He works for the city, Sales executive.  He needs refills on albuterol, compliant with Breo. He has stopped using Dupixent-he complains of rash over his face and scaling over his forehead  Hypertension is a problem and he is noted to be hypertensive today 160/100.  He reports humming over his left ear  Significant tests/ events reviewed  HRCT 11/2019 >> post Covid inflammatory fibrosis  PFTs 11/2019 FEV1 86%, ratio 82, FVC 81%, no significant bronchodilator response, DLCO 94%. NPSG 09/2019 >> AHI at 153/hour, SPO2 low 79%, BiPAP 18/14  2020 spirometry-ratio 87, FEV1 1.97-54%, FVC 2.2 648% 09/2018 spirometry-ratio 80, FEV1 86%, FVC 84% 09/2018 eos 0 Home sleep study April 2019 showed severe sleep apnea with REI 73/hour severe desaturations with minimum O2 saturation at 58%.    Past Medical History:  Diagnosis Date  . Asthma   . Claustrophobia   . Depression   . Hypertension   . Kidney stone    YRS AGO   PASSED ON OWN     Review of Systems neg for any significant sore throat, dysphagia, itching, sneezing, nasal congestion or excess/ purulent secretions, fever, chills, sweats, unintended wt loss, pleuritic or exertional cp, hempoptysis, orthopnea pnd or change in chronic leg swelling. Also denies presyncope, palpitations, heartburn, abdominal pain, nausea,  vomiting, diarrhea or change in bowel or urinary habits, dysuria,hematuria, rash, arthralgias, visual complaints, headache, numbness weakness or ataxia.     Objective:   Physical Exam  Gen. Pleasant, obese, in no distress, normal affect ENT - no pallor,icterus, no post nasal drip, class 2-3 airway Neck: No JVD, no thyromegaly, no carotid bruits Lungs: no use of accessory muscles, no dullness to percussion, decreased without rales or rhonchi  Cardiovascular: Rhythm regular, heart sounds  normal, no murmurs or gallops, 1+  peripheral edema Abdomen: soft and non-tender, no hepatosplenomegaly, BS normal. Musculoskeletal: No deformities, no cyanosis or clubbing Neuro:  alert, non focal, no tremors       Assessment & Plan:

## 2020-10-29 NOTE — Assessment & Plan Note (Signed)
He has very severe OSA.  Unfortunately he could not tolerate CPAP machine.  I tried various ways to convince him about the need for getting back on CPAP. Unfortunately he is not a good candidate for alternative therapy such as oral appliance and inspire device .  I discussed weight loss with him in order for him to the weight and wellness. He is not ready for bariatric surgery. He will contact us again if he is ready to restart PAP therapy I think he would do well with auto BiPAP starting lower the pressure and he would need a CPAP liner to prevent markings on his face

## 2020-10-29 NOTE — Patient Instructions (Signed)
Referral to Dr Suszanne Conners / ENT for humming in your ear. Refills on Breo and albuterol.  Continue to use oxygen 3 L during sleep. Call us back if you are ready to restart CPAP I would advise referral to weight loss program

## 2020-10-29 NOTE — Assessment & Plan Note (Signed)
He has stopped using Dupixent and fortunately asthma seems to be controlled. Refills on Breo and albuterol

## 2020-10-29 NOTE — Assessment & Plan Note (Signed)
Continue using oxygen 3 L during sleep

## 2020-12-02 ENCOUNTER — Telehealth: Payer: Self-pay | Admitting: Pulmonary Disease

## 2020-12-02 NOTE — Telephone Encounter (Signed)
Called and spoke with pt letting him know the info stated by Dr. Vassie Loll. After stating that info to pt, pt stated that he would keep equipment for a little longer and if he still decided that he wanted them discontinued he would call us back and let us know. Nothing further needed.

## 2020-12-02 NOTE — Telephone Encounter (Signed)
Called and spoke with pt. Pt stated he is wanting to return his O2 to [employer] due to not wearing it. Pt stated the last time he wore his O2 was about 2 weeks after last OV with our office and has not worn it since.  Stated to pt that last OV with RA states to continue using 3L O2 at night while sleeping but pt said that he is not wearing it and wants it picked up.  Pt said due to him having to pay a deductible on the O2, he no longer wants it especially if he is not using it.  Dr. Vassie Loll, please advise if you are okay with [place] placing order to have pt's O2 discontinued or what you recommend.

## 2020-12-02 NOTE — Telephone Encounter (Signed)
He has very severe OSA and has stopped using biPAP machine Please let him know that BiPAP is the best form of treatment for his severe sleep apnea which can lead to cardiac complications Since he was unable to use BiPAP, I had asked him during his last visit to continue using oxygen In my  opinion, let him know that without either of these therapies he is at very high risk to have a heart attack in his sleep.  Ultimately of course it is his decision

## 2020-12-03 ENCOUNTER — Other Ambulatory Visit: Payer: Self-pay

## 2020-12-03 ENCOUNTER — Encounter: Payer: Self-pay | Admitting: Allergy and Immunology

## 2020-12-03 ENCOUNTER — Ambulatory Visit: Payer: 59 | Admitting: Allergy and Immunology

## 2020-12-03 VITALS — BP 170/102 | HR 85 | Temp 98.1°F | Resp 24 | Ht 68.0 in | Wt 350.0 lb

## 2020-12-03 DIAGNOSIS — J3089 Other allergic rhinitis: Secondary | ICD-10-CM | POA: Diagnosis not present

## 2020-12-03 DIAGNOSIS — J4550 Severe persistent asthma, uncomplicated: Secondary | ICD-10-CM

## 2020-12-03 DIAGNOSIS — J4551 Severe persistent asthma with (acute) exacerbation: Secondary | ICD-10-CM

## 2020-12-03 MED ORDER — TRELEGY ELLIPTA 200-62.5-25 MCG/INH IN AEPB: 1.0000 | INHALATION_SPRAY | Freq: Every day | RESPIRATORY_TRACT | 5 refills | Status: DC

## 2020-12-03 MED ORDER — ALBUTEROL SULFATE HFA 108 (90 BASE) MCG/ACT IN AERS: 2.0000 | INHALATION_SPRAY | Freq: Four times a day (QID) | RESPIRATORY_TRACT | 6 refills | Status: DC | PRN

## 2020-12-03 MED ORDER — FLUTICASONE PROPIONATE 50 MCG/ACT NA SUSP: 2.0000 | Freq: Every day | NASAL | 5 refills | Status: DC

## 2020-12-03 MED ORDER — MONTELUKAST SODIUM 10 MG PO TABS: 10.0000 mg | ORAL_TABLET | Freq: Every day | ORAL | 5 refills | Status: DC

## 2020-12-03 MED ORDER — CETIRIZINE HCL 10 MG PO TABS: 10.0000 mg | ORAL_TABLET | Freq: Every day | ORAL | 5 refills | Status: DC

## 2020-12-03 NOTE — Progress Notes (Unsigned)
[place]   Follow-up Note  Referring Provider: Darrow Bussing, MD Primary Provider: Darrow Bussing, MD Date of Office Visit: 12/03/2020  Subjective:   Don [name] (DOB: 11-02-1965) is a 55 y.o. male who [facility] in re-evaluation of the following:  HPI: Don [name] presents to this clinic in evaluation of asthma.  He has not been seen in this clinic since a Dr. Delorse Lek visit on 23 June 2019.  He states that he has had a terrible 2 months.  He has received 3 systemic steroids during this timeframe for constant wheezing and coughing and uses nebulized bronchodilator 4 times per day even though he consistently uses his combination inhaler and montelukast.  In 2021 he had a similar type of history.  Most recently he went to the urgent care center in [facility] and had a chest x-ray which apparently was normal and received a systemic steroid injection and was given oral prednisone which he is presently utilizing.  He has constant wheezing and coughing and some phlegm production without any chest pain.  He has minimal upper airway symptoms except for stuffiness and nasal congestion and he can smell and taste and does not have any ugly nasal discharge.  He was Covid tested this week and apparently had a negative swab.  He did contract COVID in March 2021.  He has very severe sleep apnea and apparently is not addressing this issue at this point in time with any form of therapy.  Allergies as of 12/03/2020      Reactions   Lisinopril Other (See Comments)   Muscle aches and fatigue.      Medication List      albuterol 108 (90 Base) MCG/ACT inhaler Commonly known as: VENTOLIN HFA Inhale 2 puffs into the lungs every 6 (six) hours as needed for shortness of breath.   amLODipine 10 MG tablet Commonly known as: NORVASC Take 10 mg by mouth daily.   Breo Ellipta 200-25 MCG/INH  Aepb Generic drug: fluticasone furoate-vilanterol Inhale 1 puff into the lungs daily. Rinse, gargle, and spit after use.   furosemide 20 MG tablet Commonly known as: LASIX Take 40 mg by mouth daily.   sertraline 50 MG tablet Commonly known as: ZOLOFT Take 50 mg by mouth daily.   valsartan 320 MG tablet Commonly known as: DIOVAN Take 320 mg by mouth daily.       Past Medical History:  Diagnosis Date  . Asthma   . Claustrophobia   . Depression   . Hypertension   . Kidney stone    YRS AGO   PASSED ON OWN     Past Surgical History:  Procedure Laterality Date  . ANKLE SURGERY     RIGHT  . LASIK    . LUMBAR LAMINECTOMY/DECOMPRESSION MICRODISCECTOMY Right 02/28/2013   Procedure: LUMBAR LAMINECTOMY/DECOMPRESSION MICRODISCECTOMY 1 LEVEL;  Surgeon: Maeola Harman, MD;  Location: [facility];  Service: Neurosurgery;  Laterality: Right;  Right Lumbar Four-Five Laminectomy  . MAXIMUM ACCESS (MAS)POSTERIOR LUMBAR INTERBODY FUSION (PLIF) 1 LEVEL N/A 04/23/2015   Procedure: L4-5 Maximum access posterior lumbar interbody fusion;  Surgeon: Maeola Harman, MD;  Location: [facility];  Service: Neurosurgery;  Laterality: N/A;  L4-5 Maximum access posterior lumbar interbody fusion  . RADIOLOGY WITH ANESTHESIA N/A 02/05/2015   Procedure: MRI - Lumbar Spine;  Surgeon: Medication Radiologist, MD;  Location: [facility] OR;  Service: Radiology;  Laterality: N/A;  .  TONSILLECTOMY     AS CHILD      Review of systems negative except as noted in HPI / PMHx or noted below:  Review of Systems  Constitutional: Negative.   HENT: Negative.   Eyes: Negative.   Respiratory: Negative.   Cardiovascular: Negative.   Gastrointestinal: Negative.   Genitourinary: Negative.   Musculoskeletal: Negative.   Skin: Negative.   Neurological: Negative.   Endo/Heme/Allergies: Negative.   Psychiatric/Behavioral: Negative.      Objective:   Vitals:   12/03/20 1445  BP: (!) 170/102  Pulse: 85  Resp: (!) 24  Temp:  98.1 F (36.7 C)  SpO2: 95%   Height: 5\' 8"  (172.7 cm)  Weight: (!) 350 lb (158.8 kg)   Physical Exam Constitutional:      Appearance: He is not diaphoretic.  HENT:     Head: Normocephalic.     Right Ear: Tympanic membrane, ear canal and external ear normal.     Left Ear: Tympanic membrane, ear canal and external ear normal.     Nose: Nose normal. No mucosal edema or rhinorrhea.     Mouth/Throat:     Mouth: Oropharynx is clear and moist and mucous membranes are normal.     Pharynx: Uvula midline. No oropharyngeal exudate.  Eyes:     Conjunctiva/sclera: Conjunctivae normal.  Neck:     Thyroid: No thyromegaly.     Trachea: Trachea normal. No tracheal tenderness or tracheal deviation.  Cardiovascular:     Rate and Rhythm: Normal rate and regular rhythm.     Heart sounds: Normal heart sounds, S1 normal and S2 normal. No murmur heard.   Pulmonary:     Effort: No respiratory distress.     Breath sounds: Normal breath sounds. No stridor. No wheezing (Bilateral expiratory wheezes all lung fields) or rales.  Musculoskeletal:        General: No edema.  Lymphadenopathy:     Head:     Right side of head: No tonsillar adenopathy.     Left side of head: No tonsillar adenopathy.     Cervical: No cervical adenopathy.  Skin:    Findings: No erythema or rash.     Nails: There is no clubbing.  Neurological:     Mental Status: He is alert.     Diagnostics:   The patient had an Asthma Control Test with the following results: ACT Total Score: 6.    Results of blood tests obtained 10 September 2019 identified WBC 14.9, absolute eosinophil 900, absolute lymphocyte 2300, hemoglobin 17.2, platelet 339.  Results of a chest CT scan obtained 10 Feb 2020 in the context of Covid pneumonitis identified the following:  Cardiovascular: Heart size normal.  No pericardial effusion.  Mediastinum/Nodes: Mediastinal lymph nodes are not enlarged by CT size criteria. Hilar regions are difficult to  definitively evaluate without IV contrast. No axillary adenopathy. Esophagus is unremarkable. Distal periesophageal lymph nodes are not enlarged by CT size criteria.  Lungs/Pleura: There is mild to moderate parenchymal architectural distortion and patchy areas of ground-glass affecting all lobes of both lungs. Mild associated bronchiectasis. No subpleural reticulation or honeycombing. There is air trapping. No pleural fluid. Airway is otherwise unremarkable.  Assessment and Plan:   1. Asthma, not well controlled, severe persistent, with acute exacerbation   2. Other allergic rhinitis     1. Tezepelumab injection today and every 4 weeks.  2.  Prednisone 10 mg -1 tablet 3 times a day until return to clinic  3. Trelegy 200 -  1 inhalation 1 time a day (replaces Breo)  4. Montelukast 10 mg - 1 tablet 1 time per day  5. Flonase - 2 sprays each nostril 1 time per day  6. Cetirizine 10 mg - 1 tablet 1 time per day  7. If needed:   A.  Albuterol + ipratropium nebulization every 4-6 hours  B.  Albuterol HFA -2 inhalations every 4-6 hours  8.  Return to clinic next week  9.  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  We will treat him with the therapy noted above and I will see him in this clinic next week.  His blood pressure is also out of control.  I have encouraged him to consistently use his antihypertensive medications and address this issue with his primary care doctor.  Laurette Schimke, MD Allergy / Immunology [facility]

## 2020-12-03 NOTE — Patient Instructions (Addendum)
  1. Tezepelumab injection today and every 4 weeks.  2.  Prednisone 10 mg -1 tablet 3 times a day until return to clinic  3. Trelegy 200 - 1 inhalation 1 time a day (replaces Breo)  4. Montelukast 10 mg - 1 tablet 1 time per day  5. Flonase - 2 sprays each nostril 1 time per day  6. Cetirizine 10 mg - 1 tablet 1 time per day  7. If needed:   A.  Albuterol + ipratropium nebulization every 4-6 hours  B.  Albuterol HFA -2 inhalations every 4-6 hours  8.  Return to clinic next week  9.  Consistently use blood pressure medicines including valsartan and amlodipine

## 2020-12-04 ENCOUNTER — Encounter: Payer: Self-pay | Admitting: Allergy and Immunology

## 2020-12-10 ENCOUNTER — Other Ambulatory Visit: Payer: Self-pay

## 2020-12-10 ENCOUNTER — Ambulatory Visit: Payer: 59 | Admitting: Allergy and Immunology

## 2020-12-10 ENCOUNTER — Encounter: Payer: Self-pay | Admitting: Allergy and Immunology

## 2020-12-10 VITALS — BP 160/90 | HR 81 | Temp 97.5°F | Resp 16

## 2020-12-10 DIAGNOSIS — J3089 Other allergic rhinitis: Secondary | ICD-10-CM

## 2020-12-10 DIAGNOSIS — J4551 Severe persistent asthma with (acute) exacerbation: Secondary | ICD-10-CM | POA: Diagnosis not present

## 2020-12-10 NOTE — Progress Notes (Signed)
[place]    Follow-up Note  Referring Provider: Darrow Bussing, MD Primary Provider: Darrow Bussing, MD Date of Office Visit: 12/10/2020  Subjective:   Don [name] (DOB: 02-Feb-1966) is a 55 y.o. male who [facility] 12[facility]2020 in re-evaluation of the following:  HPI: Don [name] returns to this clinic in evaluation of asthma.  He was last seen in his clinic on 03 December 2020 with a rather significant exacerbation requiring lots of acute therapy.  He is better.  He still has cough and shortness of breath but he is about 50% better at this point in time.  He has been consistently using all of his medical therapy as previously prescribed prescribed.  Allergies as of 12/10/2020      Reactions   Lisinopril Other (See Comments)   Muscle aches and fatigue.      Medication List      albuterol 108 (90 Base) MCG/ACT inhaler Commonly known as: VENTOLIN HFA Inhale 2 puffs into the lungs every 6 (six) hours as needed for shortness of breath.   amLODipine 10 MG tablet Commonly known as: NORVASC Take 10 mg by mouth daily.   Breo Ellipta 200-25 MCG/INH Aepb Generic drug: fluticasone furoate-vilanterol Inhale 1 puff into the lungs daily. Rinse, gargle, and spit after use.   cetirizine 10 MG tablet Commonly known as: ZYRTEC Take 1 tablet (10 mg total) by mouth daily.   fluticasone 50 MCG/ACT nasal spray Commonly known as: Flonase Place 2 sprays into both nostrils daily.   furosemide 20 MG tablet Commonly known as: LASIX Take 40 mg by mouth daily.   montelukast 10 MG tablet Commonly known as: Singulair Take 1 tablet (10 mg total) by mouth at bedtime.   sertraline 50 MG tablet Commonly known as: ZOLOFT Take 50 mg by mouth daily.   Trelegy Ellipta 200-62.5-25 MCG/INH Aepb Generic drug: Fluticasone-Umeclidin-Vilant Inhale 1 puff into the lungs daily.   valsartan 320 MG tablet Commonly known as:  DIOVAN Take 320 mg by mouth daily.       Past Medical History:  Diagnosis Date  . Asthma   . Claustrophobia   . Depression   . Hypertension   . Kidney stone    YRS AGO   PASSED ON OWN     Past Surgical History:  Procedure Laterality Date  . ANKLE SURGERY     RIGHT  . LASIK    . LUMBAR LAMINECTOMY/DECOMPRESSION MICRODISCECTOMY Right 02/28/2013   Procedure: LUMBAR LAMINECTOMY/DECOMPRESSION MICRODISCECTOMY 1 LEVEL;  Surgeon: Maeola Harman, MD;  Location: [facility];  Service: Neurosurgery;  Laterality: Right;  Right Lumbar Four-Five Laminectomy  . MAXIMUM ACCESS (MAS)POSTERIOR LUMBAR INTERBODY FUSION (PLIF) 1 LEVEL N/A 04/23/2015   Procedure: L4-5 Maximum access posterior lumbar interbody fusion;  Surgeon: Maeola Harman, MD;  Location: [facility];  Service: Neurosurgery;  Laterality: N/A;  L4-5 Maximum access posterior lumbar interbody fusion  . RADIOLOGY WITH ANESTHESIA N/A 02/05/2015   Procedure: MRI - Lumbar Spine;  Surgeon: Medication Radiologist, MD;  Location: [facility];  Service: Radiology;  Laterality: N/A;  . TONSILLECTOMY     AS CHILD      Review of systems negative except as noted in HPI / PMHx or noted below:  Review of Systems  Constitutional: Negative.   HENT: Negative.   Eyes: Negative.   Respiratory: Negative.   Cardiovascular: Negative.   Gastrointestinal: Negative.   Genitourinary: Negative.   Musculoskeletal: Negative.  Skin: Negative.   Neurological: Negative.   Endo/Heme/Allergies: Negative.   Psychiatric/Behavioral: Negative.      Objective:   Vitals:   12/10/20 1544  BP: (!) 160/90  Pulse: 81  Resp: 16  Temp: (!) 97.5 F (36.4 C)  SpO2: 94%          Physical Exam Constitutional:      Appearance: He is not diaphoretic.  HENT:     Head: Normocephalic.     Right Ear: Tympanic membrane, ear canal and external ear normal.     Left Ear: Tympanic membrane, ear canal and external ear normal.     Nose: Nose normal. No mucosal edema or  rhinorrhea.     Mouth/Throat:     Pharynx: Uvula midline. No oropharyngeal exudate.  Eyes:     Conjunctiva/sclera: Conjunctivae normal.  Neck:     Thyroid: No thyromegaly.     Trachea: Trachea normal. No tracheal tenderness or tracheal deviation.  Cardiovascular:     Rate and Rhythm: Normal rate and regular rhythm.     Heart sounds: Normal heart sounds, S1 normal and S2 normal. No murmur heard.   Pulmonary:     Effort: No respiratory distress.     Breath sounds: No stridor. Wheezing (Expiratory wheezes posterior lung fields bilaterally) present. No rales.  Lymphadenopathy:     Head:     Right side of head: No tonsillar adenopathy.     Left side of head: No tonsillar adenopathy.     Cervical: No cervical adenopathy.  Skin:    Findings: No erythema or rash.     Nails: There is no clubbing.  Neurological:     Mental Status: He is alert.     Diagnostics:    Spirometry was performed and demonstrated an FEV1 of 2.79 at 78 % of predicted.  Assessment and Plan:   1. Asthma, not well controlled, severe persistent, with acute exacerbation   2. Other allergic rhinitis     1.  Continue Tezepelumab injection every 4 weeks.  2.  Continue Trelegy 200 - 1 inhalation 1 time a day    3.  Continue montelukast 10 mg - 1 tablet 1 time per day  4.  Continue Flonase - 2 sprays each nostril 1 time per day  5.  Continue cetirizine 10 mg - 1 tablet 1 time per day  6. If needed:   A.  Albuterol + ipratropium nebulization every 4-6 hours  B.  Albuterol HFA -2 inhalations every 4-6 hours  7. Taper prednisone 10 mg tablet - 2 tablets daily x 7 days, then 1-1/2 tablet daily x 7 days, then 1 tablet daily x 7 days, then 1/2 tablet daily x 7 days, then discontinue.  8.  Return to clinic in 1 month or earlier if problem  Don [name] is better.  It is going to take him a long time to get resolve all the inflammation and mucus production that has been in existence for the past several months in his  lower airway.  We are going to slowly taper down his systemic steroids as noted above and I will see him back in his clinic in 1 month or earlier if there is a problem.  Laurette Schimke, MD Allergy / Immunology [facility]

## 2020-12-10 NOTE — Patient Instructions (Addendum)
  1.  Continue Tezepelumab injection every 4 weeks.  2.  Continue Trelegy 200 - 1 inhalation 1 time a day    3.  Continue montelukast 10 mg - 1 tablet 1 time per day  4.  Continue Flonase - 2 sprays each nostril 1 time per day  5.  Continue cetirizine 10 mg - 1 tablet 1 time per day  6. If needed:   A.  Albuterol + ipratropium nebulization every 4-6 hours  B.  Albuterol HFA -2 inhalations every 4-6 hours  7. Taper prednisone 10 mg tablet - 2 tablets daily x 7 days, then 1-1/2 tablet daily x 7 days, then 1 tablet daily x 7 days, then 1/2 tablet daily x 7 days, then discontinue.  8.  Return to clinic in 1 month or earlier if problem

## 2020-12-11 ENCOUNTER — Encounter: Payer: Self-pay | Admitting: Allergy and Immunology

## 2020-12-12 NOTE — Addendum Note (Signed)
Addended by: Briant Cedar L on: 12/12/2020 10:08 AM   Modules accepted: Orders

## 2021-01-07 ENCOUNTER — Ambulatory Visit (INDEPENDENT_AMBULATORY_CARE_PROVIDER_SITE_OTHER): Payer: 59 | Admitting: *Deleted

## 2021-01-07 ENCOUNTER — Other Ambulatory Visit: Payer: Self-pay

## 2021-01-07 DIAGNOSIS — J4550 Severe persistent asthma, uncomplicated: Secondary | ICD-10-CM | POA: Diagnosis not present

## 2021-01-07 NOTE — Progress Notes (Signed)
Patient received 1.47mL of Te[place]sp[place] in the LUA and left without waiting.  LOT # [id number]  EXP 05/29/2023

## 2021-01-21 ENCOUNTER — Ambulatory Visit: Payer: 59 | Admitting: Allergy and Immunology

## 2021-01-21 ENCOUNTER — Encounter: Payer: Self-pay | Admitting: Allergy and Immunology

## 2021-01-21 ENCOUNTER — Other Ambulatory Visit: Payer: Self-pay

## 2021-01-21 VITALS — BP 138/90 | HR 80 | Temp 98.8°F | Resp 20

## 2021-01-21 DIAGNOSIS — J3089 Other allergic rhinitis: Secondary | ICD-10-CM | POA: Diagnosis not present

## 2021-01-21 DIAGNOSIS — J4550 Severe persistent asthma, uncomplicated: Secondary | ICD-10-CM | POA: Diagnosis not present

## 2021-01-21 MED ORDER — TRELEGY ELLIPTA 200-62.5-25 MCG/INH IN AEPB: 1.0000 | INHALATION_SPRAY | Freq: Every day | RESPIRATORY_TRACT | 5 refills | Status: DC

## 2021-01-21 MED ORDER — IPRATROPIUM-ALBUTEROL 0.5-2.5 (3) MG/3ML IN SOLN: 3.0000 mL | Freq: Four times a day (QID) | RESPIRATORY_TRACT | 3 refills | Status: DC | PRN

## 2021-01-21 MED ORDER — ALBUTEROL SULFATE HFA 108 (90 BASE) MCG/ACT IN AERS: 2.0000 | INHALATION_SPRAY | Freq: Four times a day (QID) | RESPIRATORY_TRACT | 6 refills | Status: DC | PRN

## 2021-01-21 MED ORDER — FLUTICASONE PROPIONATE 50 MCG/ACT NA SUSP: 2.0000 | Freq: Every day | NASAL | 5 refills | Status: DC

## 2021-01-21 MED ORDER — CETIRIZINE HCL 10 MG PO TABS: 10.0000 mg | ORAL_TABLET | Freq: Every day | ORAL | 5 refills | Status: DC

## 2021-01-21 MED ORDER — MONTELUKAST SODIUM 10 MG PO TABS: 10.0000 mg | ORAL_TABLET | Freq: Every day | ORAL | 5 refills | Status: DC

## 2021-01-21 NOTE — Progress Notes (Signed)
[place] - [place] - [place] - [place] - [place]   Follow-up Note  Referring Provider: Darrow Bussing, MD Primary Provider: Darrow Bussing, MD Date of Office Visit: 01/21/2021  Subjective:   Don [name] (DOB: 22-May-1966) is a 55 y.o. male who [facility] in re-evaluation of the following:  HPI: Berman returns to this clinic in evaluation of his severe asthma and allergic rhinitis.  His last visit to this clinic was 10 December 2020.  At that point in time he was completing a prolonged course of systemic steroids.  He completed his steroids and continues to do very well.  He has had 2 injections of tezepelumab, continues on a triple inhaler and a leukotrienes modifier, and rarely uses a short acting bronchodilator at this point.  He has had no problems with his nose.  He continues on Flonase.  Allergies as of 01/21/2021      Reactions   Lisinopril Other (See Comments)   Muscle aches and fatigue.      Medication List    albuterol 108 (90 Base) MCG/ACT inhaler Commonly known as: VENTOLIN HFA Inhale 2 puffs into the lungs every 6 (six) hours as needed for shortness of breath.   amLODipine 10 MG tablet Commonly known as: NORVASC Take 10 mg by mouth daily.   cetirizine 10 MG tablet Commonly known as: ZYRTEC Take 1 tablet (10 mg total) by mouth daily.   fluticasone 50 MCG/ACT nasal spray Commonly known as: Flonase Place 2 sprays into both nostrils daily.   furosemide 20 MG tablet Commonly known as: LASIX Take 40 mg by mouth daily.   ipratropium-albuterol 0.5-2.5 (3) MG/3ML Soln Commonly known as: DUONEB Take 3 mLs by nebulization every 6 (six) hours as needed. Started by: Jessica Priest, MD   montelukast 10 MG tablet Commonly known as: Singulair Take 1 tablet (10 mg total) by mouth at bedtime.   sertraline 50 MG tablet Commonly known as: ZOLOFT Take 50 mg by mouth daily.   Trelegy Ellipta 200-62.5-25 MCG/INH  Aepb Generic drug: Fluticasone-Umeclidin-Vilant Inhale 1 puff into the lungs daily.   valsartan 320 MG tablet Commonly known as: DIOVAN Take 320 mg by mouth daily.       Past Medical History:  Diagnosis Date  . Asthma   . Claustrophobia   . Depression   . Hypertension   . Kidney stone    YRS AGO   PASSED ON OWN     Past Surgical History:  Procedure Laterality Date  . ANKLE SURGERY     RIGHT  . LASIK    . LUMBAR LAMINECTOMY/DECOMPRESSION MICRODISCECTOMY Right 02/28/2013   Procedure: LUMBAR LAMINECTOMY/DECOMPRESSION MICRODISCECTOMY 1 LEVEL;  Surgeon: Maeola Harman, MD;  Location: [facility];  Service: Neurosurgery;  Laterality: Right;  Right Lumbar Four-Five Laminectomy  . MAXIMUM ACCESS (MAS)POSTERIOR LUMBAR INTERBODY FUSION (PLIF) 1 LEVEL N/A 04/23/2015   Procedure: L4-5 Maximum access posterior lumbar interbody fusion;  Surgeon: Maeola Harman, MD;  Location: [facility];  Service: Neurosurgery;  Laterality: N/A;  L4-5 Maximum access posterior lumbar interbody fusion  . RADIOLOGY WITH ANESTHESIA N/A 02/05/2015   Procedure: MRI - Lumbar Spine;  Surgeon: Medication Radiologist, MD;  Location: [facility] OR;  Service: Radiology;  Laterality: N/A;  . TONSILLECTOMY     AS CHILD      Review of systems negative except as noted in HPI / PMHx or noted below:  Review of Systems  Constitutional: Negative.   HENT: Negative.  Eyes: Negative.   Respiratory: Negative.   Cardiovascular: Negative.   Gastrointestinal: Negative.   Genitourinary: Negative.   Musculoskeletal: Negative.   Skin: Negative.   Neurological: Negative.   Endo/Heme/Allergies: Negative.   Psychiatric/Behavioral: Negative.      Objective:   Vitals:   01/21/21 1533  BP: 138/90  Pulse: 80  Resp: 20  Temp: 98.8 F (37.1 C)  SpO2: 95%          Physical Exam Constitutional:      Appearance: He is not diaphoretic.  HENT:     Head: Normocephalic.     Right Ear: Tympanic membrane, ear canal and external ear  normal.     Left Ear: Tympanic membrane, ear canal and external ear normal.     Nose: Nose normal. No mucosal edema or rhinorrhea.     Mouth/Throat:     Pharynx: Uvula midline. No oropharyngeal exudate.  Eyes:     Conjunctiva/sclera: Conjunctivae normal.  Neck:     Thyroid: No thyromegaly.     Trachea: Trachea normal. No tracheal tenderness or tracheal deviation.  Cardiovascular:     Rate and Rhythm: Normal rate and regular rhythm.     Heart sounds: Normal heart sounds, S1 normal and S2 normal. No murmur heard.   Pulmonary:     Effort: No respiratory distress.     Breath sounds: Normal breath sounds. No stridor. No wheezing or rales.  Lymphadenopathy:     Head:     Right side of head: No tonsillar adenopathy.     Left side of head: No tonsillar adenopathy.     Cervical: No cervical adenopathy.  Skin:    Findings: No erythema or rash.     Nails: There is no clubbing.  Neurological:     Mental Status: He is alert.     Diagnostics:    Spirometry was performed and demonstrated an FEV1 of 3.10 at 86 % of predicted.  Assessment and Plan:   1. Severe persistent asthma without complication   2. Other allergic rhinitis     1.  Continue Tezepelumab injection every 4 weeks.  2.  Continue Trelegy 200 - 1 inhalation 1 time a day    3.  Continue montelukast 10 mg - 1 tablet 1 time per day  4.  Continue Flonase - 2 sprays each nostril 1 time per day  5.  Continue cetirizine 10 mg - 1 tablet 1 time per day  6. If needed:   A.  Albuterol + ipratropium nebulization every 4-6 hours  B.  Albuterol HFA -2 inhalations every 4-6 hours  7. Return to clinic in 12 weeks or earlier if problem  Arlen is doing much better and we will continue to have him use a anti-TSLP antibody and a triple inhaler and a leukotriene modifier and a nasal steroid to address his multiorgan atopic respiratory disease.  I will see him back in this clinic in 12 weeks or earlier if there is a  problem.  Laurette Schimke, MD Allergy / Immunology [facility]

## 2021-01-21 NOTE — Patient Instructions (Signed)
  1.  Continue Tezepelumab injection every 4 weeks.  2.  Continue Trelegy 200 - 1 inhalation 1 time a day    3.  Continue montelukast 10 mg - 1 tablet 1 time per day  4.  Continue Flonase - 2 sprays each nostril 1 time per day  5.  Continue cetirizine 10 mg - 1 tablet 1 time per day  6. If needed:   A.  Albuterol + ipratropium nebulization every 4-6 hours  B.  Albuterol HFA -2 inhalations every 4-6 hours  7. Return to clinic in 12 weeks or earlier if problem

## 2021-01-22 ENCOUNTER — Encounter: Payer: Self-pay | Admitting: Allergy and Immunology

## 2021-01-24 MED ORDER — TEZEPELUMAB-EKKO 210 MG/1.91ML ~~LOC~~ SOSY
210.0000 mg | PREFILLED_SYRINGE | Freq: Once | SUBCUTANEOUS | Status: AC
  Administered 2021-01-07: 210 mg via SUBCUTANEOUS

## 2021-01-24 NOTE — Addendum Note (Signed)
Addended by: Devoria Glassing on: 01/24/2021 12:44 PM   Modules accepted: Orders

## 2021-02-04 ENCOUNTER — Ambulatory Visit (INDEPENDENT_AMBULATORY_CARE_PROVIDER_SITE_OTHER): Payer: 59 | Admitting: *Deleted

## 2021-02-04 ENCOUNTER — Other Ambulatory Visit: Payer: Self-pay | Admitting: *Deleted

## 2021-02-04 ENCOUNTER — Other Ambulatory Visit: Payer: Self-pay

## 2021-02-04 DIAGNOSIS — J4550 Severe persistent asthma, uncomplicated: Secondary | ICD-10-CM | POA: Diagnosis not present

## 2021-02-04 MED ORDER — TEZEPELUMAB-EKKO 210 MG/1.91ML ~~LOC~~ SOSY
210.0000 mg | PREFILLED_SYRINGE | Freq: Once | SUBCUTANEOUS | Status: AC
  Administered 2021-02-04: 210 mg via SUBCUTANEOUS

## 2021-02-04 MED ORDER — MONTELUKAST SODIUM 10 MG PO TABS: 10.0000 mg | ORAL_TABLET | Freq: Every day | ORAL | 1 refills | Status: DC

## 2021-03-04 ENCOUNTER — Ambulatory Visit (INDEPENDENT_AMBULATORY_CARE_PROVIDER_SITE_OTHER): Payer: 59 | Admitting: *Deleted

## 2021-03-04 DIAGNOSIS — J4550 Severe persistent asthma, uncomplicated: Secondary | ICD-10-CM | POA: Diagnosis not present

## 2021-03-04 MED ORDER — TEZEPELUMAB-EKKO 210 MG/1.91ML ~~LOC~~ SOSY
210.0000 mg | PREFILLED_SYRINGE | Freq: Once | SUBCUTANEOUS | Status: AC
  Administered 2021-03-04: 210 mg via SUBCUTANEOUS

## 2021-03-06 ENCOUNTER — Telehealth: Payer: Self-pay | Admitting: Pulmonary Disease

## 2021-03-06 DIAGNOSIS — J9611 Chronic respiratory failure with hypoxia: Secondary | ICD-10-CM

## 2021-03-06 DIAGNOSIS — G4734 Idiopathic sleep related nonobstructive alveolar hypoventilation: Secondary | ICD-10-CM

## 2021-03-06 NOTE — Telephone Encounter (Signed)
DME order placed to discontinue oxygen.  Patient called and made aware DME order has been placed.  Understanding stated. Nothing further at this time.

## 2021-03-06 NOTE — Telephone Encounter (Signed)
Called and spoke with Patient.  Patient requested a O2 discontinued order be placed to [facility].  Patient stated he only used O2 prn, and insurance is not covering O2 cost.    LOV Dr Vassie Loll 10/29/20-  Instructions   Return in about 4 months (around 02/26/2021) for TP/ me. Referral to Dr Suszanne Conners / ENT for humming in your ear. Refills on Breo and albuterol.   Continue to use oxygen 3 L during sleep. Call us back if you are ready to restart CPAP I would advise referral to weight loss program     Message routed to Dr. Vassie Loll

## 2021-03-11 NOTE — Addendum Note (Signed)
Addended by: Pilar Grammes on: 03/11/2021 09:00 AM   Modules accepted: Orders

## 2021-03-13 ENCOUNTER — Telehealth: Payer: Self-pay | Admitting: Pulmonary Disease

## 2021-03-13 NOTE — Telephone Encounter (Signed)
Called and spoke with patient. He stated that he had called the office last week to have an order placed to D/C his oxygen to [employer]. He contacted [employer] earlier this week and they had not received any orders from our office. I advised him that per the order, the order was sent on 06/09 and we received a confirmation that it was received. He is aware that it will sometimes take a few days for them to reach out to him to setup a pickup date. He verbalized understanding. He will call us back if [employer] has not reached out to him in 2 weeks.   Nothing further needed at time of call.

## 2021-04-01 ENCOUNTER — Encounter: Payer: Self-pay | Admitting: Family

## 2021-04-01 ENCOUNTER — Ambulatory Visit: Payer: Self-pay

## 2021-04-01 ENCOUNTER — Ambulatory Visit: Payer: 59 | Admitting: Family

## 2021-04-01 ENCOUNTER — Other Ambulatory Visit: Payer: Self-pay

## 2021-04-01 VITALS — BP 140/102 | HR 91 | Temp 96.4°F | Resp 18 | Ht 68.0 in | Wt 360.0 lb

## 2021-04-01 DIAGNOSIS — L299 Pruritus, unspecified: Secondary | ICD-10-CM | POA: Diagnosis not present

## 2021-04-01 DIAGNOSIS — J4550 Severe persistent asthma, uncomplicated: Secondary | ICD-10-CM | POA: Diagnosis not present

## 2021-04-01 DIAGNOSIS — J3089 Other allergic rhinitis: Secondary | ICD-10-CM

## 2021-04-01 MED ORDER — TEZEPELUMAB-EKKO 210 MG/1.91ML ~~LOC~~ SOSY
210.0000 mg | PREFILLED_SYRINGE | SUBCUTANEOUS | Status: AC
  Administered 2021-04-01 – 2024-05-12 (×37): 210 mg via SUBCUTANEOUS

## 2021-04-01 NOTE — Progress Notes (Addendum)
[address] [place] [address] Dept: [phone]  FOLLOW UP NOTE  Patient ID: Don [name], male    DOB: [birth year]  Age: 55 y.o. MRN: [id number] Date of Office Visit: 04/01/2021  Assessment  Chief Complaint: Asthma (Shortness of breath, ) and Other (He gets shots every month and has itching and he scratching until he start bleeding and itching on scalp )  HPI Don [name] is a 55 year old male who presents today for an acute visit.  He was last seen on January 21, 2021 by Dr. Lucie Leather for severe persistent asthma without complication and allergic rhinitis.  He reports for the past 3-31/2 weeks he has had daily itching all over his body.  The itching is not constant.  He wonders if Tezspire or Trelegy could be the cause.  He reports in the past he was on Dupixent and stopped due to itching.  He reports at that time the itching was more on his head/scalp.  The itching started in 1 area but he cannot remember where that was.  The itching is worse on his feet at night.  He has not seen any rashes or hives.  No one else in the household has this itching.  He was not sick or on antibiotics before the itching started.  He also denies any new medications or new products to his knowledge since his last office visit.  He has not had any bug bites and is not able to correlate the pruritus to foods.  Also, he denies fever and chills.  He reports that he has joint pain but has had this prior to the pruritus started.  He is currently not taking any antihistamines.  Severe persistent asthma without complication is reported as moderately controlled with Tezspire injection every 4 weeks, Trelegy 1 puff once a day ,and albuterol as needed.  He is not taking montelukast 10 mg once a day.  He reports shortness of breath at times and tightness in his chest at times.  He denies any coughing, wheezing, and nocturnal awakenings due to breathing problems.  Since his last office visit he has not required any  systemic steroids or made any trips to the emergency room or urgent care due to breathing problems.  He uses his albuterol occasionally, but reports that when he uses it he does not feel like it works.  Allergic rhinitis is reported as controlled with no medications at this time.  He denies any rhinorrhea, nasal congestion, and postnasal drip.  He has not had any sinus infections since we last saw him.  He is not taking montelukast 10 mg once a day, Flonase nasal spray, or cetirizine 10 mg once a day.   Drug Allergies:  Allergies  Allergen Reactions   Lisinopril Other (See Comments)    Muscle aches and fatigue.    Review of Systems: Review of Systems  Constitutional:  Negative for chills and fever.  HENT:         Denies rhinorrhea, nasal congestion, and postnasal drip  Eyes:        Reports watery eyes and denies itchy eyes  Respiratory:  Positive for shortness of breath. Negative for cough and wheezing.        Reports shortness of breath at times, and tightness in his chest at times.  Denies coughing, wheezing, nocturnal awakenings due to breathing problems  Cardiovascular:  Negative for chest pain and palpitations.  Gastrointestinal:  Positive for heartburn.       Reports heartburn at  times for which he takes Tums  Genitourinary:  Negative for dysuria.  Skin:  Positive for itching. Negative for rash.  Neurological:  Negative for headaches.  Endo/Heme/Allergies:  Positive for environmental allergies.    Physical Exam: BP (!) 140/102   Pulse 91   Temp (!) 96.4 F (35.8 C)   Resp 18   Ht 5\' 8"  (1.727 m)   Wt (!) 360 lb (163.3 kg)   SpO2 94%   BMI 54.74 kg/m    Physical Exam Constitutional:      Appearance: Normal appearance.  HENT:     Head: Normocephalic and atraumatic.     Comments: Pharynx normal, eyes normal, ears normal, nose normal    Right Ear: Tympanic membrane, ear canal and external ear normal.     Left Ear: Tympanic membrane, ear canal and external ear  normal.     Nose: Nose normal.     Mouth/Throat:     Mouth: Mucous membranes are moist.     Pharynx: Oropharynx is clear.  Eyes:     Conjunctiva/sclera: Conjunctivae normal.  Cardiovascular:     Rate and Rhythm: Regular rhythm.     Heart sounds: Normal heart sounds.  Pulmonary:     Effort: Pulmonary effort is normal.     Breath sounds: Normal breath sounds.     Comments: Lungs clear to auscultation Musculoskeletal:     Cervical back: Neck supple.  Skin:    General: Skin is warm.     Comments: Excoriation marks along with small scabs noted on bilateral arms and legs.  No oozing, erythema, or bleeding noted.  No rashes or urticarial lesions noted  Neurological:     Mental Status: He is alert and oriented to person, place, and time.  Psychiatric:        Mood and Affect: Mood normal.        Behavior: Behavior normal.        Thought Content: Thought content normal.        Judgment: Judgment normal.    Diagnostics: FVC 4.04 L, FEV1 3.36 L.  Predicted FVC 4.64 L, FEV1 3.57 L.  Spirometry indicates normal ventilatory function.  Assessment and Plan: 1. Pruritus   2. Severe persistent asthma without complication   3. Other allergic rhinitis     No orders of the defined types were placed in this encounter.   Patient Instructions   1.  Continue Tezepelumab injection every 4 weeks for now  2.  Continue Trelegy 200 - 1 inhalation 1 time a day    3.  Start. montelukast 10 mg - 1 tablet 1 time per day. Patient cautioned that rarely some children/adults can experience behavioral changes after beginning montelukast. These side effects are rare, however, if you notice any change, notify the clinic and discontinue montelukast. This can help with your asthma and itching  4.  Continue Flonase - 2 sprays each nostril 1 time per day  5.  Start cetirizine 10 mg - 1 tablet one to two times a day. Caution as this can make you drowsy. Please call and let us know if this is not helping  6.  If needed:   A.  Albuterol + ipratropium nebulization every 4-6 hours  B.  Albuterol HFA -2 inhalations every 4-6 hours  We will get blood work to follow up on your itching. We will call you with results.  Your blood pressure was elevated in today's office visit. Please schedule an appointment with your primary care  physician to discuss. 7. Return to clinic in 4 weeks with Dr. Lucie Leather or earlier if problem Return in about 4 weeks (around 04/29/2021), or if symptoms worsen or fail to improve.    Thank you for the opportunity to care for this patient.  Please do not hesitate to contact me with questions.  Nehemiah Settle, FNP [facility]  I have provided oversight concerning evaluation and treatment of this patient's health issues addressed during today's encounter. I agree with the assessment and therapeutic plan as outlined in the note.   Signed,   Jessica Priest, MD,  Allergy and Immunology,   [facility].

## 2021-04-01 NOTE — Patient Instructions (Addendum)
  1.  Continue Tezepelumab injection every 4 weeks for now  2.  Continue Trelegy 200 - 1 inhalation 1 time a day    3.  Start. montelukast 10 mg - 1 tablet 1 time per day. Patient cautioned that rarely some children/adults can experience behavioral changes after beginning montelukast. These side effects are rare, however, if you notice any change, notify the clinic and discontinue montelukast. This can help with your asthma and itching  4.  Continue Flonase - 2 sprays each nostril 1 time per day  5.  Start cetirizine 10 mg - 1 tablet one to two times a day. Caution as this can make you drowsy. Please call us and let us know if this is not helping  6. If needed:   A.  Albuterol + ipratropium nebulization every 4-6 hours  B.  Albuterol HFA -2 inhalations every 4-6 hours  We will get blood work to follow up on your itching. We will call you with results.  Your blood pressure was elevated in today's office visit. Please schedule an appointment with your primary care physician to discuss. 7. Return to clinic in 4 weeks with Dr. Lucie Leather or earlier if problem

## 2021-04-02 LAB — COMPREHENSIVE METABOLIC PANEL
ALT: 20 IU/L (ref 0–44)
AST: 15 IU/L (ref 0–40)
Albumin/Globulin Ratio: 1.8 (ref 1.2–2.2)
Albumin: 4.1 g/dL (ref 3.8–4.9)
Alkaline Phosphatase: 124 IU/L — ABNORMAL HIGH (ref 44–121)
BUN/Creatinine Ratio: 19 (ref 9–20)
BUN: 25 mg/dL — ABNORMAL HIGH (ref 6–24)
Bilirubin Total: 0.2 mg/dL (ref 0.0–1.2)
CO2: 26 mmol/L (ref 20–29)
Calcium: 9.4 mg/dL (ref 8.7–10.2)
Chloride: 103 mmol/L (ref 96–106)
Creatinine, Ser: 1.34 mg/dL — ABNORMAL HIGH (ref 0.76–1.27)
Globulin, Total: 2.3 g/dL (ref 1.5–4.5)
Glucose: 77 mg/dL (ref 65–99)
Potassium: 4.4 mmol/L (ref 3.5–5.2)
Sodium: 143 mmol/L (ref 134–144)
Total Protein: 6.4 g/dL (ref 6.0–8.5)
eGFR: 63 mL/min/{1.73_m2} (ref >59)

## 2021-04-02 LAB — CBC WITH DIFFERENTIAL
Basophils Absolute: 0.1 10*3/uL (ref 0.0–0.2)
Basos: 1 %
EOS (ABSOLUTE): 0.2 10*3/uL (ref 0.0–0.4)
Eos: 1 %
Hematocrit: 51.3 % — ABNORMAL HIGH (ref 37.5–51.0)
Hemoglobin: 16.3 g/dL (ref 13.0–17.7)
Immature Grans (Abs): 0.1 10*3/uL (ref 0.0–0.1)
Immature Granulocytes: 1 %
Lymphocytes Absolute: 2.1 10*3/uL (ref 0.7–3.1)
Lymphs: 15 %
MCH: 27.8 pg (ref 26.6–33.0)
MCHC: 31.8 g/dL (ref 31.5–35.7)
MCV: 88 fL (ref 79–97)
Monocytes Absolute: 1.3 10*3/uL — ABNORMAL HIGH (ref 0.1–0.9)
Monocytes: 9 %
Neutrophils Absolute: 10.6 10*3/uL — ABNORMAL HIGH (ref 1.4–7.0)
Neutrophils: 73 %
RBC: 5.86 x10E6/uL — ABNORMAL HIGH (ref 4.14–5.80)
RDW: 13.1 % (ref 11.6–15.4)
WBC: 14.4 10*3/uL — ABNORMAL HIGH (ref 3.4–10.8)

## 2021-04-02 LAB — THYROID CASCADE PROFILE: TSH: 2.04 u[IU]/mL (ref 0.450–4.500)

## 2021-04-02 NOTE — Progress Notes (Signed)
Please let Don [name] know that his Thyroid level is normal.  His white blood cells, red blood cells, hematocrit, and neutrophils are elevated. This could be due to dehydration. I would like for him to get this lab re-drawn at his next office visit in 4 weeks with Dr. Lucie Leather. Please make sure to drink plenty of fluids the day of your appointment.  Your BUN and creatinine were also elevated. This could be due to dehydration also. Let's redo this lab at your next appointment too.  Your alkaline phosphatase is also slightly elevated. We will re-check this at your next appointment.  Please send a copy of his lab results to his primary care physician to review.  Please put in a lab order for cbc with diff and comprehensive metabolic panel for [name] to have completed in 4 weeks. Diagnosis: abnormal lab results.

## 2021-04-03 NOTE — Addendum Note (Signed)
Addended by: Robet Leu A on: 04/03/2021 06:40 PM   Modules accepted: Orders

## 2021-04-04 NOTE — Progress Notes (Signed)
Noted! Thank you

## 2021-04-15 ENCOUNTER — Ambulatory Visit: Payer: 59 | Admitting: Allergy and Immunology

## 2021-04-29 ENCOUNTER — Ambulatory Visit (INDEPENDENT_AMBULATORY_CARE_PROVIDER_SITE_OTHER): Payer: 59

## 2021-04-29 ENCOUNTER — Other Ambulatory Visit: Payer: Self-pay

## 2021-04-29 DIAGNOSIS — J4550 Severe persistent asthma, uncomplicated: Secondary | ICD-10-CM | POA: Diagnosis not present

## 2021-05-27 ENCOUNTER — Other Ambulatory Visit: Payer: Self-pay

## 2021-05-27 ENCOUNTER — Ambulatory Visit (INDEPENDENT_AMBULATORY_CARE_PROVIDER_SITE_OTHER): Payer: 59 | Admitting: Allergy and Immunology

## 2021-05-27 ENCOUNTER — Ambulatory Visit (INDEPENDENT_AMBULATORY_CARE_PROVIDER_SITE_OTHER): Payer: 59 | Admitting: *Deleted

## 2021-05-27 VITALS — BP 138/82 | HR 78 | Temp 98.2°F | Resp 16 | Ht 68.0 in | Wt 350.0 lb

## 2021-05-27 DIAGNOSIS — J4550 Severe persistent asthma, uncomplicated: Secondary | ICD-10-CM

## 2021-05-27 DIAGNOSIS — M7989 Other specified soft tissue disorders: Secondary | ICD-10-CM

## 2021-05-27 DIAGNOSIS — J3089 Other allergic rhinitis: Secondary | ICD-10-CM | POA: Diagnosis not present

## 2021-05-27 DIAGNOSIS — R0609 Other forms of dyspnea: Secondary | ICD-10-CM

## 2021-05-27 DIAGNOSIS — G4733 Obstructive sleep apnea (adult) (pediatric): Secondary | ICD-10-CM

## 2021-05-27 DIAGNOSIS — R0600 Dyspnea, unspecified: Secondary | ICD-10-CM

## 2021-05-27 NOTE — Progress Notes (Signed)
[place] - [place]   Follow-up Note  Referring Provider: Darrow Bussing, MD Primary Provider: Darrow Bussing, MD Date of Office Visit: 05/27/2021  Subjective:   Don [name] (DOB: 12-06-1965) is a 55 y.o. male who [facility]21 in re-evaluation of the following:  HPI: Don [name] returns to this clinic in evaluation of asthma and allergic rhinitis and a pruritic disorder and untreated sleep apnea.  His last visit to this clinic was with our nurse practitioner on 01 April 2021.   When he was last seen in this clinic he was having a pruritic disorder which has since resolved with the use of Zyrtec and he has no symptoms to suggest an ongoing reaction to any of his medications at this point.  His big complaint is shortness of breath and dyspnea on exertion.  He has no coughing or wheezing.  He uses albuterol and DuoNeb which does not help this issue.  He does have blood pressure issues and is on 3 different blood pressure medicines.  None of them sound like a beta-blocker.  He had an echo performed in 2015 which was essentially normal except for some diastolic dysfunction and he had a suboptimal stress test performed in 2017 which did not identify any ischemia but did identify a hypertensive response to exercise.  Overall his asthma has been under excellent control and he he has not required a systemic steroid to treat an exacerbation and he continues to use a large collection of anti-inflammatory medications including use of an anti-TSLP antibody.  He has had very little problems with his upper airways as well and he has not required an antibiotic treating episode of sinusitis.  He did contract COVID for the second time mostly with nasal symptoms and a cough and fatigue for which he was treated with antiviral agents without any long-term sequela although he might be a little bit more with his breathing issue since that  event.  He is not using any device for his untreated sleep apnea associated with nocturnal hypoxemia.  Allergies as of 05/27/2021       Reactions   Lisinopril Other (See Comments)   Muscle aches and fatigue.        Medication List    albuterol 108 (90 Base) MCG/ACT inhaler Commonly known as: VENTOLIN HFA Inhale 2 puffs into the lungs every 6 (six) hours as needed for shortness of breath.   amLODipine 10 MG tablet Commonly known as: NORVASC Take 10 mg by mouth daily.   cetirizine 10 MG tablet Commonly known as: ZYRTEC Take 1 tablet (10 mg total) by mouth daily.   fluticasone 50 MCG/ACT nasal spray Commonly known as: Flonase Place 2 sprays into both nostrils daily.   furosemide 20 MG tablet Commonly known as: LASIX Take 40 mg by mouth daily.   ipratropium-albuterol 0.5-2.5 (3) MG/3ML Soln Commonly known as: DUONEB Take 3 mLs by nebulization every 6 (six) hours as needed.   montelukast 10 MG tablet Commonly known as: Singulair Take 1 tablet (10 mg total) by mouth at bedtime.   sertraline 50 MG tablet Commonly known as: ZOLOFT Take 50 mg by mouth daily.   Trelegy Ellipta 200-62.5-25 MCG/INH Aepb Generic drug: Fluticasone-Umeclidin-Vilant Inhale 1 puff into the lungs daily.   valsartan 320 MG tablet Commonly known as: DIOVAN Take 320 mg by mouth daily.    Past Medical History:  Diagnosis Date   Asthma    Claustrophobia  Depression    Hypertension    Kidney stone    YRS AGO   PASSED ON OWN     Past Surgical History:  Procedure Laterality Date   ANKLE SURGERY     RIGHT   LASIK     LUMBAR LAMINECTOMY/DECOMPRESSION MICRODISCECTOMY Right 02/28/2013   Procedure: LUMBAR LAMINECTOMY/DECOMPRESSION MICRODISCECTOMY 1 LEVEL;  Surgeon: Maeola Harman, MD;  Location: [facility];  Service: Neurosurgery;  Laterality: Right;  Right Lumbar Four-Five Laminectomy   MAXIMUM ACCESS (MAS)POSTERIOR LUMBAR INTERBODY FUSION (PLIF) 1 LEVEL N/A 04/23/2015   Procedure: L4-5  Maximum access posterior lumbar interbody fusion;  Surgeon: Maeola Harman, MD;  Location: [facility];  Service: Neurosurgery;  Laterality: N/A;  L4-5 Maximum access posterior lumbar interbody fusion   RADIOLOGY WITH ANESTHESIA N/A 02/05/2015   Procedure: MRI - Lumbar Spine;  Surgeon: Medication Radiologist, MD;  Location: [facility];  Service: Radiology;  Laterality: N/A;   TONSILLECTOMY     AS CHILD      Review of systems negative except as noted in HPI / PMHx or noted below:  Review of Systems  Constitutional: Negative.   HENT: Negative.    Eyes: Negative.   Respiratory: Negative.    Cardiovascular: Negative.   Gastrointestinal: Negative.   Genitourinary: Negative.   Musculoskeletal: Negative.   Skin: Negative.   Neurological: Negative.   Endo/Heme/Allergies: Negative.   Psychiatric/Behavioral: Negative.      Objective:   Vitals:   05/27/21 1717  BP: 138/82  Pulse: 78  Resp: 16  Temp: 98.2 F (36.8 C)  SpO2: 96%   Height: 5\' 8"  (172.7 cm)  Weight: (!) 350 lb (158.8 kg)   Physical Exam Constitutional:      Appearance: He is not diaphoretic.  HENT:     Head: Normocephalic.     Right Ear: Tympanic membrane, ear canal and external ear normal.     Left Ear: Tympanic membrane, ear canal and external ear normal.     Nose: Nose normal. No mucosal edema or rhinorrhea.     Mouth/Throat:     Pharynx: Uvula midline. No oropharyngeal exudate.  Eyes:     Conjunctiva/sclera: Conjunctivae normal.  Neck:     Thyroid: No thyromegaly.     Trachea: Trachea normal. No tracheal tenderness or tracheal deviation.  Cardiovascular:     Rate and Rhythm: Normal rate and regular rhythm.     Heart sounds: Normal heart sounds, S1 normal and S2 normal. No murmur heard. Pulmonary:     Effort: No respiratory distress.     Breath sounds: Normal breath sounds. No stridor. No wheezing or rales.  Musculoskeletal:     Right lower leg: Edema present.     Left lower leg: Edema present.   Lymphadenopathy:     Head:     Right side of head: No tonsillar adenopathy.     Left side of head: No tonsillar adenopathy.     Cervical: No cervical adenopathy.  Skin:    Findings: No erythema or rash.     Nails: There is no clubbing.  Neurological:     Mental Status: He is alert.    Diagnostics:    Spirometry was performed and demonstrated an FEV1 of 3.61 at 101 % of predicted.  Results of blood tests obtained 01 April 2021 identifies creatinine 1.34 mg/DL, ALT 20 U/L, AST 15 U/L, WBC 14.4, absolute eosinophil 200, absolute lymphocyte 2100, absolute monocyte 1300, hemoglobin 16.3, TSH 2.040 IU/mL  Assessment and Plan:   1. Asthma,  severe persistent, well-controlled   2. Other allergic rhinitis   3. Obstructive sleep apnea syndrome   4. Morbid obesity (HCC)   5. Leg swelling   6. Dyspnea on exertion     1.  Continue Tezepelumab injection every 4 weeks for now  2.  Continue Trelegy 200 - 1 inhalation 1 time a day    3.  Continue montelukast 10 mg - 1 tablet 1 time per day.    4.  Continue Flonase - 2 sprays each nostril 1 time per day  5. If needed:   A.  Albuterol + ipratropium nebulization every 4-6 hours  B.  Albuterol HFA -2 inhalations every 4-6 hours  C. Cetirizine 10 mg - 1 tablet 1 time per day  6. Obtain ECHO for dyspnea and leg swelling  7. Need to start a slowly progressive exercise routine and weight loss routine  8. Return to clinic in 12 weeks or earlier if problem  9. Obtain fall flu vaccine  I think that Jahlil's asthma is maximally treated and I do not think that his dyspnea is secondary to this condition at this point in time.  Certainly he is morbidly obese and he is cardiac deconditioned and we need to repeat an echo to make sure that his heart function is normal especially given the fact that he has lower extremity edema.  He may require a nuclear stress test to make sure were not dealing with ischemia giving rise to his dyspnea on exertion.   Hopefully his lower extremity edema is a reflection of using his calcium channel blocker.  He has been trying to lose weight for decades and has been on excess successful in doing so.  I had a talk with him today about consideration for surgical procedure to address his obesity.  At this point he did not appear to be particularly interested in pursuing that form of therapy.  But he needs to do something about his morbid obesity for that he has untreated sleep apnea associated with nocturnal hypoxemia and he has systemic arterial hypertension requiring 3 medications and still remains with significant dyspnea probably all tied up with his weight issue.  Laurette Schimke, MD Allergy / Immunology [facility]

## 2021-05-27 NOTE — Patient Instructions (Addendum)
  1.  Continue Tezepelumab injection every 4 weeks for now  2.  Continue Trelegy 200 - 1 inhalation 1 time a day    3.  Continue montelukast 10 mg - 1 tablet 1 time per day.    4.  Continue Flonase - 2 sprays each nostril 1 time per day  5. If needed:   A.  Albuterol + ipratropium nebulization every 4-6 hours  B.  Albuterol HFA -2 inhalations every 4-6 hours  C. Cetirizine 10 mg - 1 tablet 1 time per day  6. Obtain ECHO for dyspnea and leg swelling  7. Need to start a slowly progressive exercise routine and weight loss routine  8. Return to clinic in 12 weeks or earlier if problem  9. Obtain fall flu vaccine

## 2021-05-28 ENCOUNTER — Other Ambulatory Visit: Payer: Self-pay | Admitting: Allergy and Immunology

## 2021-05-28 ENCOUNTER — Encounter: Payer: Self-pay | Admitting: Allergy and Immunology

## 2021-05-28 MED ORDER — IPRATROPIUM-ALBUTEROL 0.5-2.5 (3) MG/3ML IN SOLN: 3.0000 mL | Freq: Four times a day (QID) | RESPIRATORY_TRACT | 3 refills | Status: DC | PRN

## 2021-05-28 MED ORDER — ALBUTEROL SULFATE HFA 108 (90 BASE) MCG/ACT IN AERS: 2.0000 | INHALATION_SPRAY | Freq: Four times a day (QID) | RESPIRATORY_TRACT | 6 refills | Status: DC | PRN

## 2021-05-28 MED ORDER — CETIRIZINE HCL 10 MG PO TABS: 10.0000 mg | ORAL_TABLET | Freq: Every day | ORAL | 5 refills | Status: DC

## 2021-05-28 MED ORDER — FLUTICASONE PROPIONATE 50 MCG/ACT NA SUSP: 2.0000 | Freq: Every day | NASAL | 5 refills | Status: DC

## 2021-05-28 MED ORDER — MONTELUKAST SODIUM 10 MG PO TABS: 10.0000 mg | ORAL_TABLET | Freq: Every day | ORAL | 1 refills | Status: DC

## 2021-05-28 MED ORDER — TRELEGY ELLIPTA 200-62.5-25 MCG/INH IN AEPB: 1.0000 | INHALATION_SPRAY | Freq: Every day | RESPIRATORY_TRACT | 5 refills | Status: DC

## 2021-05-29 NOTE — Addendum Note (Signed)
Addended by: Tawnya Crook on: 05/29/2021 12:21 PM   Modules accepted: Orders

## 2021-05-30 ENCOUNTER — Telehealth: Payer: Self-pay | Admitting: *Deleted

## 2021-05-30 NOTE — Telephone Encounter (Signed)
-----   Message from Jessica Priest, MD sent at 05/28/2021 12:27 PM EDT ----- Regarding: FW: PA# for echocardiogram Please address this request.  ----- Message ----- From: Minette Brine Sent: 05/28/2021   9:38 AM EDT To: Jessica Priest, MD Subject: PA# for echocardiogram                         We received an order for an Echocardiogram from your Provider.  Will you please check to see if a Prior Authorization is needed for the following CPT codes: 53614, [id number], [id number].  Once PA# is completed  please document on  the order in the comment line and we will be glad to call and schedule.  The appointment will not be able to be made until the PA# is obtained. Thank you so much for your help with this.  Please send to Referral Coord. Thank you

## 2021-05-30 NOTE — Telephone Encounter (Signed)
Called and checked with patient's insurance and a Pre Authorization is not required. Called and left a voicemail with the Cardiology department advising that a PA is not needed and the patient is good to be scheduled. Replied back to [name] with Cardiology and advised the same.

## 2021-05-30 NOTE — Telephone Encounter (Signed)
Error

## 2021-06-03 ENCOUNTER — Ambulatory Visit: Payer: 59 | Admitting: Allergy and Immunology

## 2021-06-20 ENCOUNTER — Other Ambulatory Visit: Payer: Self-pay

## 2021-06-20 ENCOUNTER — Ambulatory Visit (HOSPITAL_COMMUNITY): Payer: 59 | Attending: Cardiology

## 2021-06-20 DIAGNOSIS — J4550 Severe persistent asthma, uncomplicated: Secondary | ICD-10-CM | POA: Insufficient documentation

## 2021-06-20 DIAGNOSIS — R0600 Dyspnea, unspecified: Secondary | ICD-10-CM

## 2021-06-20 LAB — ECHOCARDIOGRAM COMPLETE
Area-P 1/2: 3.03 cm2
S' Lateral: 3.2 cm

## 2021-06-24 ENCOUNTER — Other Ambulatory Visit: Payer: Self-pay

## 2021-06-24 ENCOUNTER — Ambulatory Visit (INDEPENDENT_AMBULATORY_CARE_PROVIDER_SITE_OTHER): Payer: 59 | Admitting: *Deleted

## 2021-06-24 DIAGNOSIS — J4550 Severe persistent asthma, uncomplicated: Secondary | ICD-10-CM | POA: Diagnosis not present

## 2021-07-02 ENCOUNTER — Telehealth: Payer: Self-pay | Admitting: *Deleted

## 2021-07-02 NOTE — Telephone Encounter (Signed)
Called patient to advise approval through medical Ins for Tezspire which he is already on through [employer]. Signed him up for copay card and submit to [facility]

## 2021-07-22 ENCOUNTER — Ambulatory Visit: Payer: 59

## 2021-07-29 ENCOUNTER — Ambulatory Visit: Payer: 59

## 2021-08-01 ENCOUNTER — Ambulatory Visit (INDEPENDENT_AMBULATORY_CARE_PROVIDER_SITE_OTHER): Payer: 59

## 2021-08-01 ENCOUNTER — Other Ambulatory Visit: Payer: Self-pay

## 2021-08-01 DIAGNOSIS — J4550 Severe persistent asthma, uncomplicated: Secondary | ICD-10-CM

## 2021-08-19 ENCOUNTER — Ambulatory Visit (INDEPENDENT_AMBULATORY_CARE_PROVIDER_SITE_OTHER): Payer: 59 | Admitting: Allergy and Immunology

## 2021-08-19 ENCOUNTER — Other Ambulatory Visit: Payer: Self-pay

## 2021-08-19 ENCOUNTER — Telehealth: Payer: Self-pay | Admitting: *Deleted

## 2021-08-19 VITALS — BP 130/80 | HR 106 | Temp 98.4°F | Resp 16 | Ht 68.0 in | Wt 372.2 lb

## 2021-08-19 DIAGNOSIS — I77819 Aortic ectasia, unspecified site: Secondary | ICD-10-CM

## 2021-08-19 DIAGNOSIS — J3089 Other allergic rhinitis: Secondary | ICD-10-CM | POA: Diagnosis not present

## 2021-08-19 DIAGNOSIS — M7989 Other specified soft tissue disorders: Secondary | ICD-10-CM

## 2021-08-19 DIAGNOSIS — R0609 Other forms of dyspnea: Secondary | ICD-10-CM

## 2021-08-19 DIAGNOSIS — J4550 Severe persistent asthma, uncomplicated: Secondary | ICD-10-CM

## 2021-08-19 DIAGNOSIS — G4733 Obstructive sleep apnea (adult) (pediatric): Secondary | ICD-10-CM

## 2021-08-19 DIAGNOSIS — Q2544 Congenital dilation of aorta: Secondary | ICD-10-CM

## 2021-08-19 MED ORDER — DESONIDE 0.05 % EX OINT: TOPICAL_OINTMENT | CUTANEOUS | 1 refills | Status: DC

## 2021-08-19 NOTE — Telephone Encounter (Signed)
Ambulatory referral has been placed for Dr. Cornelious Bryant. Turner, MD Cardiologist [address] [place] [address] [phone] For Dilated aorta and exercise breathing issues.

## 2021-08-19 NOTE — Patient Instructions (Addendum)
  1.  Continue Tezepelumab injection every 4 weeks   2.  Continue Trelegy 200 - 1 inhalation 1 time a day    3.  Continue montelukast 10 mg - 1 tablet 1 time per day.    4.  Continue Flonase - 2 sprays each nostril 1 time per day  5. If needed:   A.  Albuterol + ipratropium nebulization every 4-6 hours  B.  Albuterol HFA -2 inhalations every 4-6 hours  C. Cetirizine 10 mg - 1 tablet 1 time per day  6. Treat skin inflammation with desonide ointment applied 1-7 times per week to skin rash until resolved  7. Can revisit with cardiology about dilated aorta and exercise breathing issues  8. Need to start a slowly progressive exercise routine and weight loss routine  9. Return to clinic in 12 weeks or earlier if problem

## 2021-08-19 NOTE — Progress Notes (Signed)
[place]   Follow-up Note  Referring Provider: Darrow Bussing, MD Primary Provider: Darrow Bussing, MD Date of Office Visit: 08/19/2021  Subjective:   Don [name] (DOB: [birth year]) is a 55 y.o. male who [facility] 08[facility]/2021 in re-evaluation of the following:  HPI: Male returns to this clinic in evaluation of asthma, allergic rhinitis, pruritic disorder, rash, dyspnea on exertion, and untreated sleep apnea.  His last visit to this clinic was 27 May 2021.  The big complaint for Shaunte at this point in time is that he still has dyspnea on exertion.  All of his other respiratory tract symptoms including his coughing and wheezing and problems when he is not exerting himself have completely resolved on his anti-TSLP antibody and a combination of anti-inflammatory agents for both his upper and lower airway.  He has no significant issues with his nose.  He uses a bronchodilator about twice a week.  In investigation of his dyspnea on exertion and lower extremity edema we obtained an echocardiogram during his last visit which did not identify any significant abnormality other than a mildly dilated ascending aorta and slight diastolic dysfunction.  He does not treat his sleep apnea at this point.  He has been developing a rash on the sides of his face and he thinks that this is secondary to the biologic agents that he has been using for the past year or so.  It is flaky and sometimes a little bit itchy.  Allergies as of 08/19/2021       Reactions   Lisinopril Other (See Comments)   Muscle aches and fatigue.        Medication List    albuterol 108 (90 Base) MCG/ACT inhaler Commonly known as: Proventil HFA Inhale 2 puffs into the lungs every 4 (four) hours as needed for wheezing or shortness of breath.   amLODipine 10 MG tablet Commonly known as: NORVASC Take 10 mg by mouth daily.    cetirizine 10 MG tablet Commonly known as: ZYRTEC Take 1 tablet (10 mg total) by mouth daily.   fluticasone 50 MCG/ACT nasal spray Commonly known as: Flonase Place 2 sprays into both nostrils daily.   furosemide 20 MG tablet Commonly known as: LASIX Take 40 mg by mouth daily.   montelukast 10 MG tablet Commonly known as: Singulair Take 1 tablet (10 mg total) by mouth at bedtime.   sertraline 50 MG tablet Commonly known as: ZOLOFT Take 50 mg by mouth daily.   Trelegy Ellipta 200-62.5-25 MCG/ACT Aepb Generic drug: Fluticasone-Umeclidin-Vilant Inhale 1 puff into the lungs daily.   valsartan 320 MG tablet Commonly known as: DIOVAN Take 320 mg by mouth daily.    Past Medical History:  Diagnosis Date   Asthma    Claustrophobia    Depression    Hypertension    Kidney stone    YRS AGO   PASSED ON OWN     Past Surgical History:  Procedure Laterality Date   ANKLE SURGERY     RIGHT   LASIK     LUMBAR LAMINECTOMY/DECOMPRESSION MICRODISCECTOMY Right 02/28/2013   Procedure: LUMBAR LAMINECTOMY/DECOMPRESSION MICRODISCECTOMY 1 LEVEL;  Surgeon: Maeola Harman, MD;  Location: [facility];  Service: Neurosurgery;  Laterality: Right;  Right Lumbar Four-Five Laminectomy   MAXIMUM ACCESS (MAS)POSTERIOR LUMBAR INTERBODY FUSION (PLIF) 1 LEVEL N/A 04/23/2015   Procedure: L4-5 Maximum access posterior lumbar interbody fusion;  Surgeon: Maeola Harman, MD;  Location: [facility]  [facility];  Service: Neurosurgery;  Laterality: N/A;  L4-5 Maximum access posterior lumbar interbody fusion   RADIOLOGY WITH ANESTHESIA N/A 02/05/2015   Procedure: MRI - Lumbar Spine;  Surgeon: Medication Radiologist, MD;  Location: [facility];  Service: Radiology;  Laterality: N/A;   TONSILLECTOMY     AS CHILD      Review of systems negative except as noted in HPI / PMHx or noted below:  Review of Systems  Constitutional: Negative.   HENT: Negative.    Eyes: Negative.   Respiratory: Negative.    Cardiovascular: Negative.    Gastrointestinal: Negative.   Genitourinary: Negative.   Musculoskeletal: Negative.   Skin: Negative.   Neurological: Negative.   Endo/Heme/Allergies: Negative.   Psychiatric/Behavioral: Negative.      Objective:   Vitals:   08/19/21 1530  BP: 130/80  Pulse: (!) 106  Resp: 16  Temp: 98.4 F (36.9 C)  SpO2: 95%   Height: 5\' 8"  (172.7 cm)  Weight: (!) 372 lb 3.2 oz (168.8 kg)   Physical Exam Constitutional:      Appearance: He is not diaphoretic.  HENT:     Head: Normocephalic.     Right Ear: Tympanic membrane, ear canal and external ear normal.     Left Ear: Tympanic membrane, ear canal and external ear normal.     Nose: Nose normal. No mucosal edema or rhinorrhea.     Mouth/Throat:     Pharynx: Uvula midline. No oropharyngeal exudate.  Eyes:     Conjunctiva/sclera: Conjunctivae normal.  Neck:     Thyroid: No thyromegaly.     Trachea: Trachea normal. No tracheal tenderness or tracheal deviation.  Cardiovascular:     Rate and Rhythm: Normal rate and regular rhythm.     Heart sounds: Normal heart sounds, S1 normal and S2 normal. No murmur heard. Pulmonary:     Effort: No respiratory distress.     Breath sounds: Normal breath sounds. No stridor. No wheezing or rales.  Musculoskeletal:     Right lower leg: Edema present.     Left lower leg: Edema present.  Lymphadenopathy:     Head:     Right side of head: No tonsillar adenopathy.     Left side of head: No tonsillar adenopathy.     Cervical: No cervical adenopathy.  Skin:    Findings: No erythema.     Nails: There is no clubbing.  Neurological:     Mental Status: He is alert.    Diagnostics:    Spirometry was performed and demonstrated an FEV1 of 2.94 at 82 % of predicted.  Results of an echocardiogram obtained 20 June 2021 identified the following:   1. Left ventricular ejection fraction, by estimation, is 55 to 60%. The  left ventricle has normal function. The left ventricle has no regional  wall  motion abnormalities. There is mild left ventricular hypertrophy.  Left ventricular diastolic parameters  are consistent with Grade I diastolic dysfunction (impaired relaxation).   2. Right ventricular systolic function is normal. The right ventricular  size is mildly enlarged.   3. The mitral valve is normal in structure. No evidence of mitral valve  regurgitation. No evidence of mitral stenosis.   4. The aortic valve is normal in structure. Aortic valve regurgitation is  not visualized. No aortic stenosis is present.   5. Aortic dilatation noted. There is mild dilatation of the ascending  aorta, measuring 41 mm.   Assessment and Plan:   1. Asthma, severe persistent, well-controlled  2. Other allergic rhinitis   3. Dyspnea on exertion   4. Dilation of aorta (HCC)   5. Obstructive sleep apnea syndrome   6. Morbid obesity (HCC)   7. Leg swelling     1.  Continue Tezepelumab injection every 4 weeks   2.  Continue Trelegy 200 - 1 inhalation 1 time a day    3.  Continue montelukast 10 mg - 1 tablet 1 time per day.    4.  Continue Flonase - 2 sprays each nostril 1 time per day  5. If needed:   A.  Albuterol + ipratropium nebulization every 4-6 hours  B.  Albuterol HFA -2 inhalations every 4-6 hours  C. Cetirizine 10 mg - 1 tablet 1 time per day  6. Treat skin inflammation with desonide ointment applied 1-7 times per week to skin rash until resolved  7. Can visit with cardiology about dilated aorta and exercise breathing issues  8. Need to start a slowly progressive exercise routine and weight loss routine  9. Return to clinic in 12 weeks or earlier if problem  Vadhir appears to be doing very well regarding the asthmatic component of his respiratory tract issue on his current therapy which does include anti-TSLP antibody.  But he still has significant dyspnea on exertion and there may be a cardiac reason for this issue or this may be based upon the fact that he has  significant obesity and untreated sleep apnea.  He can visit with cardiology about his dilated aorta and his exercise-induced breathing disorder.  He can engage in a progressive exercise routine and weight loss routine to address his morbid obesity which is contributing to some of his respiratory tract issue.  I did have a talk with him today about possibly seeking out consultation for a surgical approach to his morbid obesity.  For the inflammation of his skin he can use desonide when needed.  He will continue on a very large collection of anti-inflammatory agents for his airway including anti-TSLP antibody as noted above.  I will see him back in this clinic in 12 weeks or earlier if there is a problem.  Allena Katz, MD Allergy / Immunology [place]

## 2021-08-20 ENCOUNTER — Encounter: Payer: Self-pay | Admitting: Allergy and Immunology

## 2021-08-20 NOTE — Telephone Encounter (Signed)
Referral has been updated to the right workque. Their office will contact the patient.  I will keep my eyes out on this referral getting scheduled.

## 2021-09-02 ENCOUNTER — Ambulatory Visit (INDEPENDENT_AMBULATORY_CARE_PROVIDER_SITE_OTHER): Payer: 59

## 2021-09-02 ENCOUNTER — Other Ambulatory Visit: Payer: Self-pay

## 2021-09-02 DIAGNOSIS — J4550 Severe persistent asthma, uncomplicated: Secondary | ICD-10-CM | POA: Diagnosis not present

## 2021-10-07 ENCOUNTER — Ambulatory Visit (INDEPENDENT_AMBULATORY_CARE_PROVIDER_SITE_OTHER): Payer: 59 | Admitting: *Deleted

## 2021-10-07 ENCOUNTER — Other Ambulatory Visit: Payer: Self-pay

## 2021-10-07 DIAGNOSIS — J4550 Severe persistent asthma, uncomplicated: Secondary | ICD-10-CM

## 2021-10-16 ENCOUNTER — Encounter: Payer: Self-pay | Admitting: Cardiology

## 2021-10-16 ENCOUNTER — Other Ambulatory Visit: Payer: Self-pay

## 2021-10-16 ENCOUNTER — Ambulatory Visit: Payer: 59 | Admitting: Cardiology

## 2021-10-16 VITALS — BP 146/102 | HR 104 | Ht 68.0 in | Wt 362.0 lb

## 2021-10-16 DIAGNOSIS — I10 Essential (primary) hypertension: Secondary | ICD-10-CM | POA: Diagnosis not present

## 2021-10-16 DIAGNOSIS — R0609 Other forms of dyspnea: Secondary | ICD-10-CM | POA: Diagnosis not present

## 2021-10-16 DIAGNOSIS — I77810 Thoracic aortic ectasia: Secondary | ICD-10-CM

## 2021-10-16 DIAGNOSIS — G4733 Obstructive sleep apnea (adult) (pediatric): Secondary | ICD-10-CM

## 2021-10-16 MED ORDER — METOPROLOL SUCCINATE ER 25 MG PO TB24: 25.0000 mg | ORAL_TABLET | Freq: Every day | ORAL | 3 refills | Status: DC

## 2021-10-16 MED ORDER — CHLORTHALIDONE 25 MG PO TABS: 25.0000 mg | ORAL_TABLET | Freq: Every day | ORAL | 3 refills | Status: DC

## 2021-10-16 NOTE — Addendum Note (Signed)
Addended by: Theresia Majors on: 10/16/2021 12:34 PM   Modules accepted: Orders

## 2021-10-16 NOTE — Addendum Note (Signed)
Addended by: Armanda Magic R on: 10/16/2021 01:21 PM   Modules accepted: Orders

## 2021-10-16 NOTE — Patient Instructions (Addendum)
Medication Instructions:  Your physician has recommended you make the following change in your medication: 1) START taking chlorthalidone 25 mg daily  2) START taking Toprol XL (metoprolol succinate) 25 mg daily  *If you need a refill on your cardiac medications before your next appointment, please call your pharmacy*  Lab Work: IN ONE WEEK: BMET, Renin, Aldosterone, 24 hour urine  If you have labs (blood work) drawn today and your tests are completely normal, you will receive your results only by: [place] (if you have MyChart) OR A paper copy in the mail If you have any lab test that is abnormal or we need to change your treatment, we will call you to review the results.   Testing/Procedures: Your physician has requested that you have a lexiscan myoview.  Your physician has requested that you have a chest CTA scan.   Your physician has requested that you have a renal artery duplex. During this test, an ultrasound is used to evaluate blood flow to the kidneys. Allow one hour for this exam. Do not eat after midnight the day before and avoid carbonated beverages. Take your medications as you usually do.  Follow-Up: At [facility], you and your health needs are our priority.  As part of our continuing mission to provide you with exceptional heart care, we have created designated Provider Care Teams.  These Care Teams include your primary Cardiologist (physician) and Advanced Practice Providers (APPs -  Physician Assistants and Nurse Practitioners) who all work together to provide you with the care you need, when you need it.  Your next appointment:   February 16th at 11:00am  The format for your next appointment:   In Person  Provider:   Fransico Him, MD    Other Instructions You have been referred to see the Pharmacist in the Hypertension Clinic.

## 2021-10-16 NOTE — Progress Notes (Addendum)
Cardiology CONSULT Note    Date:  10/16/2021   ID:  Don [name], DOB 11-16-1965, MRN [id number]  PCP:  Don Chill Sermon, PA-C  Cardiologist:  Don Him, MD   Chief Complaint  Patient presents with   Shortness of Breath   Hypertension   Sleep Apnea    History of Present Illness:  Don [name] is a 56 y.o. male who is being seen today for the evaluation of dyspnea on exertion at the request of Kozlow, Don Poag, MD.  This is a 56 year old morbidly obese male with a history of asthma, allergic rhinitis, untreated sleep apnea, shortness of breath depression and hypertension.  He has a history of tobacco use.  He was recently seen by Dr. Neldon [name] his allergist and was still complaining of dyspnea on exertion.  Apparently all his complaints of coughing and wheezing and resting shortness of breath completely resolved on his anti-- TSL P antibody and a combination of anti-inflammatory agents for upper and lower airways.  He is also been having lower extremity edema.  2D echo was obtained on 06/20/2021 showed normal LV function with EF 55 to 60% with mild LVH.  There was grade 1 diastolic dysfunction.  The right ventricle was mildly enlarged.  There was mild dilatation of the ascending aorta at 41 mm.  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  There was no mention of an enlarged aorta but this was a noncontrasted study.  He had a chest CT angio in 2017 that showed no dilatation or aortic aneurysm.  He tells me that mainly his SOB now mainly occurs with exertion.  He tells me that he has chronic DOE but after getting COVID 19 his sx of DOE significantly worsened.  He cannot take a shower or getting dressed without SOB and really any exertional activities limit [name] from a respiratory standpoint.  He has chronic LE edema. He denies any chest pain or pressure, PND, orthopnea,  dizziness, palpitations (except when exerting himself) or syncope. He is compliant with his meds and is tolerating meds with no SE.     Past Medical History:  Diagnosis Date   Asthma    Claustrophobia    Depression    Hypertension    Kidney stone    YRS AGO   PASSED ON OWN     Past Surgical History:  Procedure Laterality Date   ANKLE SURGERY     RIGHT   LASIK     LUMBAR LAMINECTOMY/DECOMPRESSION MICRODISCECTOMY Right 02/28/2013   Procedure: LUMBAR LAMINECTOMY/DECOMPRESSION MICRODISCECTOMY 1 LEVEL;  Surgeon: Don Levine, MD;  Location: [facility];  Service: Neurosurgery;  Laterality: Right;  Right Lumbar Four-Five Laminectomy   MAXIMUM ACCESS (MAS)POSTERIOR LUMBAR INTERBODY FUSION (PLIF) 1 LEVEL N/A 04/23/2015   Procedure: L4-5 Maximum access posterior lumbar interbody fusion;  Surgeon: Don Levine, MD;  Location: [facility];  Service: Neurosurgery;  Laterality: N/A;  L4-5 Maximum access posterior lumbar interbody fusion   RADIOLOGY WITH ANESTHESIA N/A 02/05/2015   Procedure: MRI - Lumbar Spine;  Surgeon: Medication Radiologist, MD;  Location: [facility];  Service: Radiology;  Laterality: N/A;   TONSILLECTOMY     AS CHILD      Current Medications: Current Meds  Medication Sig   albuterol (PROVENTIL HFA) 108 (90 Base) MCG/ACT inhaler Inhale 2 puffs into the lungs every 4 (four) hours as needed for wheezing or shortness of breath.  amLODipine (NORVASC) 10 MG tablet Take 10 mg by mouth daily.   cetirizine (ZYRTEC) 10 MG tablet Take 1 tablet (10 mg total) by mouth daily.   desonide (DESOWEN) 0.05 % ointment 1 application 1-7 times per week to rash until resolved.   fluticasone (FLONASE) 50 MCG/ACT nasal spray Place 2 sprays into both nostrils daily.   Fluticasone-Umeclidin-Vilant (TRELEGY ELLIPTA) 200-62.5-25 MCG/INH AEPB Inhale 1 puff into the lungs daily.   furosemide (LASIX) 20 MG tablet Take 20 mg by mouth daily.   montelukast (SINGULAIR) 10 MG tablet Take 1 tablet (10 mg total) by  mouth at bedtime.   sertraline (ZOLOFT) 50 MG tablet Take 50 mg by mouth daily.    tamsulosin (FLOMAX) 0.4 MG CAPS capsule Take 0.4 mg by mouth daily.   valsartan (DIOVAN) 320 MG tablet Take 320 mg by mouth daily.    Current Facility-Administered Medications for the 10/16/21 encounter (Office Visit) with Don [name], Don Eriksson R, MD  Medication   tezepelumab-ekko (TEZSPIRE) 210 MG/1.91ML syringe 210 mg    Allergies:   Lisinopril   Social History   Socioeconomic History   Marital status: Married    Spouse name: Not on file   Number of children: 3   Years of education: Not on file   Highest education level: Not on file  Occupational History   Occupation: Landscaping    Employer: [employer]  Tobacco Use   Smoking status: Former    Packs/day: 1.00    Years: 6.00    Pack years: 6.00    Types: Cigarettes    Quit date: 09/28/1990    Years since quitting: 31.0   Smokeless tobacco: Never  Vaping Use   Vaping Use: Never used  Substance and Sexual Activity   Alcohol use: Yes    Comment: SOCIAL    Drug use: No   Sexual activity: Not on file  Other Topics Concern   Not on file  Social History Narrative   Not on file   Social Determinants of Health   Financial Resource Strain: Not on file  Food Insecurity: Not on file  Transportation Needs: Not on file  Physical Activity: Not on file  Stress: Not on file  Social Connections: Not on file     Family History:  The patient's family history includes Breast cancer in his mother; Diabetes in his mother; Hypertension in his father and mother; Stroke in his father.   ROS:   Please see the history of present illness.    ROS All other systems reviewed and are negative.  PAD Screen 09/22/2016  Previous PAD dx? No  Previous surgical procedure? No  Pain with walking? No  Feet/toe relief with dangling? No  Painful, non-healing ulcers? No  Extremities discolored? No       PHYSICAL EXAM:   VS:  BP (!) 146/102    Pulse (!) 104     Ht 5\' 8"  (1.727 m)    Wt (!) 362 lb (164.2 kg)    SpO2 97%    BMI 55.04 kg/m    GEN: Well nourished, well developed, in no acute distress Morbid obesity HEENT: normal  Neck: no JVD, carotid bruits, or masses Cardiac: RRR; no murmurs, rubs, or gallops.  1+ LE  edema.  Intact distal pulses bilaterally.  Respiratory:  clear to auscultation bilaterally, normal work of breathing GI: soft, nontender, nondistended, + BS MS: no deformity or atrophy  Skin: warm and dry, no rash Neuro:  Alert and Oriented x 3, Strength and  sensation are intact Psych: euthymic mood, full affect  Wt Readings from Last 3 Encounters:  10/16/21 (!) 362 lb (164.2 kg)  08/19/21 (!) 372 lb 3.2 oz (168.8 kg)  05/27/21 (!) 350 lb (158.8 kg)      Studies/Labs Reviewed:   EKG:  EKG is ordered today.  The ekg ordered today demonstrates Sinus tachycardia at 108bpm  Recent Labs: 04/01/2021: ALT 20; BUN 25; Creatinine, Ser 1.34; Hemoglobin 16.3; Potassium 4.4; Sodium 143; TSH 2.040   Lipid Panel    Component Value Date/Time   TRIG 64 09/01/2019 1422     Additional studies/ records that were reviewed today include:  Office visit notes and 2D echo from Dr. Bruna Potter office    ASSESSMENT:    1. DOE (dyspnea on exertion)   2. Dilated aortic root ([place])   3. OSA (obstructive sleep apnea)      PLAN:  In order of problems listed above:  Dyspnea on exertion -Suspect this is multifactorial related to his underlying asthma and allergies but also possible pulmonary fibrosis as documented by a high-resolution chest CT on 12/11/2019.  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   -He was seen by pulmonary in February 2022>>he has chronic DOE but worsened significantly after COVID 19 infection -Pulmonary function tests in March 2021 were normal -He has severe obstructive sleep apnea with a sleep study  showing an AHI of 153/h by an PSG in 2021 and he was placed on BiPAP at this is been followed by Dr. Elsworth Soho.  He stopped using his BiPAP. -He does have a significant history of tobacco use , obesity and poorly controlled HTN and therefore need to also consider that he could have underlying CAD. -Unfortunately his BMI prohibits coronary CTA imaging to assess for CAD -I will get a 2-day Lexiscan Myoview to rule out ischemia -Shared Decision Making/Informed Consent The risks [chest pain, shortness of breath, cardiac arrhythmias, dizziness, blood pressure fluctuations, myocardial infarction, stroke/transient ischemic attack, nausea, vomiting, allergic reaction, radiation exposure, metallic taste sensation and life-threatening complications (estimated to be 1 in 10,000)], benefits (risk stratification, diagnosing coronary artery disease, treatment guidance) and alternatives of a nuclear stress test were discussed in detail with Don [name] and he agrees to proceed. -Recent 2D echocardiogram showed normal LV function with grade 1 diastolic dysfunction -his BP Is also poorly controlled and likely contributing to SOB with he exerts himself  2.  Dilated aorta -Recent 2D echo showed a dilated aortic root at 38 mm -In review of prior chest CTs from 2017 and 2021 there is no mention of any enlargement of the aorta -I will get a chest CTA to assess aortic dimensions  3.  Obstructive sleep apnea -This is severe and he had been on BiPAP but stopped using it -He had mild RV dilatation on his 2D echo with no pulmonary hypertension and likely the etiology of this is related to his sleep apnea and chronic hypoxemia at night -I will get [name] back into see Dr. Elsworth Soho  4.  Hypertension -His BP is poorly controlled and likely multifactorial from morbid obesity, untreated obstructive sleep apnea and essential hypertension -I will check a 24-hour urine for catecholamines, metanephrines, VMA, cortisol, dopamine -Check renal  duplex to rule out renal artery stenosis -Check plasma renin and aldosterone levels and PRA ratio -Continue prescription drug management with amlodipine 10 mg daily, Diovan 320 mg daily -I will add on  chlorthalidone 25 mg daily -He has not been on beta-blocker therapy because of his asthma but could consider a beta 1 selective agent such as Toprol>> I will add on Toprol-XL 25 mg daily -Check bmet in 1 week -Follow-up with Pharm.D. in our hypertension clinic in 1 week for up titration of BP meds  Time Spent: 20 minutes total time of encounter, including 15 minutes spent in face-to-face patient care on the date of this encounter. This time includes coordination of care and counseling regarding above mentioned problem list. Remainder of non-face-to-face time involved reviewing chart documents/testing relevant to the patient encounter and documentation in the medical record. I have independently reviewed documentation from referring provider  Medication Adjustments/Labs and Tests Ordered: Current medicines are reviewed at length with the patient today.  Concerns regarding medicines are outlined above.  Medication changes, Labs and Tests ordered today are listed in the Patient Instructions below.  There are no Patient Instructions on file for this visit.   Signed, Don Him, MD  10/16/2021 12:21 PM    [address], [place], [place]  [address] Phone: [phone]; Fax: [phone]

## 2021-10-24 ENCOUNTER — Other Ambulatory Visit: Payer: Self-pay | Admitting: Cardiology

## 2021-10-24 ENCOUNTER — Telehealth (HOSPITAL_COMMUNITY): Payer: Self-pay | Admitting: *Deleted

## 2021-10-24 ENCOUNTER — Other Ambulatory Visit: Payer: 59

## 2021-10-24 NOTE — Telephone Encounter (Signed)
Patient given detailed instructions per Myocardial Perfusion Study Information Sheet for the test on 10/31/21 at 7:45. Patient notified to arrive 15 minutes early and that it is imperative to arrive on time for appointment to keep from having the test rescheduled.  If you need to cancel or reschedule your appointment, please call the office within 24 hours of your appointment. . Patient verbalized understanding.Don [name]

## 2021-10-27 ENCOUNTER — Telehealth: Payer: Self-pay

## 2021-10-27 DIAGNOSIS — R0609 Other forms of dyspnea: Secondary | ICD-10-CM

## 2021-10-27 NOTE — Telephone Encounter (Signed)
-----   Message from Livingston Diones, [place] sent at 10/27/2021  9:03 AM EST ----- Regarding: BMET NEEDED Hey This patient needs a BMET and would like it on the latest possible appointment on this Thursday. Can someone please place this order and give him a call? PT has a CTA and a NM test on Friday morning.   Thanks [employer]

## 2021-10-29 LAB — CATECHOLAMINES, FRACTIONATED, URINE, 24 HOUR
Dopamine , 24H Ur: 171 ug/24 hr (ref 0–510)
Dopamine, Rand Ur: 95 ug/L
Epinephrine, 24H Ur: <2 ug/24 hr (ref 0–20)
Epinephrine, Rand Ur: <1 ug/L
Norepinephrine, 24H Ur: 38 ug/24 hr (ref 0–135)
Norepinephrine, Rand Ur: 21 ug/L

## 2021-10-29 LAB — METANEPHRINES, URINE, 24 HOUR
Metaneph Total, Ur: 35 ug/L
Metanephrines, 24H Ur: 63 ug/24 hr (ref 58–276)
Normetanephrine, 24H Ur: 335 ug/24 hr (ref 156–729)
Normetanephrine, Ur: 186 ug/L

## 2021-10-29 LAB — VANILLYLMANDELIC ACID, 24-HR U
VMA, 24H Ur Adult: 2.9 mg/24 hr (ref 0.0–7.5)
VMA, Urine: 1.6 mg/L

## 2021-10-30 ENCOUNTER — Other Ambulatory Visit: Payer: 59

## 2021-10-30 ENCOUNTER — Other Ambulatory Visit: Payer: Self-pay

## 2021-10-30 DIAGNOSIS — R0609 Other forms of dyspnea: Secondary | ICD-10-CM

## 2021-10-30 DIAGNOSIS — I77810 Thoracic aortic ectasia: Secondary | ICD-10-CM

## 2021-10-30 DIAGNOSIS — G4733 Obstructive sleep apnea (adult) (pediatric): Secondary | ICD-10-CM

## 2021-10-30 DIAGNOSIS — I10 Essential (primary) hypertension: Secondary | ICD-10-CM

## 2021-10-31 ENCOUNTER — Ambulatory Visit (INDEPENDENT_AMBULATORY_CARE_PROVIDER_SITE_OTHER)
Admission: RE | Admit: 2021-10-31 | Discharge: 2021-10-31 | Disposition: A | Discharge status: Home / Self Care | Payer: 59 | Source: Ambulatory Visit | Attending: Cardiology | Admitting: Cardiology

## 2021-10-31 ENCOUNTER — Ambulatory Visit (HOSPITAL_COMMUNITY): Payer: 59 | Attending: Cardiovascular Disease

## 2021-10-31 DIAGNOSIS — I10 Essential (primary) hypertension: Secondary | ICD-10-CM

## 2021-10-31 DIAGNOSIS — R0609 Other forms of dyspnea: Secondary | ICD-10-CM | POA: Insufficient documentation

## 2021-10-31 DIAGNOSIS — I77810 Thoracic aortic ectasia: Secondary | ICD-10-CM

## 2021-10-31 DIAGNOSIS — G4733 Obstructive sleep apnea (adult) (pediatric): Secondary | ICD-10-CM | POA: Insufficient documentation

## 2021-10-31 MED ORDER — REGADENOSON 0.4 MG/5ML IV SOLN
0.4000 mg | Freq: Once | INTRAVENOUS | Status: AC
  Administered 2021-10-31: 0.4 mg via INTRAVENOUS

## 2021-10-31 MED ORDER — IOHEXOL 350 MG/ML SOLN
100.0000 mL | Freq: Once | INTRAVENOUS | Status: AC | PRN
  Administered 2021-10-31: 100 mL via INTRAVENOUS

## 2021-10-31 MED ORDER — TECHNETIUM TC 99M TETROFOSMIN IV KIT
32.7000 | PACK | Freq: Once | INTRAVENOUS | Status: AC | PRN
  Administered 2021-10-31: 32.7 via INTRAVENOUS
  Filled 2021-10-31: qty 33

## 2021-11-04 ENCOUNTER — Ambulatory Visit: Payer: 59

## 2021-11-04 ENCOUNTER — Telehealth: Payer: Self-pay

## 2021-11-04 ENCOUNTER — Ambulatory Visit (INDEPENDENT_AMBULATORY_CARE_PROVIDER_SITE_OTHER): Payer: 59 | Admitting: *Deleted

## 2021-11-04 ENCOUNTER — Ambulatory Visit: Payer: 59 | Admitting: Allergy and Immunology

## 2021-11-04 ENCOUNTER — Other Ambulatory Visit: Payer: Self-pay

## 2021-11-04 DIAGNOSIS — I77810 Thoracic aortic ectasia: Secondary | ICD-10-CM

## 2021-11-04 DIAGNOSIS — J4550 Severe persistent asthma, uncomplicated: Secondary | ICD-10-CM

## 2021-11-04 DIAGNOSIS — R0609 Other forms of dyspnea: Secondary | ICD-10-CM

## 2021-11-04 MED ORDER — METOPROLOL SUCCINATE ER 50 MG PO TB24: 50.0000 mg | ORAL_TABLET | Freq: Every day | ORAL | 3 refills | Status: DC

## 2021-11-04 NOTE — Telephone Encounter (Signed)
-----   Message from Sueanne Margarita, MD sent at 11/02/2021  3:29 PM EST ----- Patient has large renal cyst that needs followup by PCP.  Stable mild dilatation of the ascending thoracic aorta at 100mm which corresponds to echo findings 9/22.  Repeat 2D echo 9/23 for followup

## 2021-11-04 NOTE — Telephone Encounter (Signed)
Sueanne Margarita, MD  11/02/2021  3:18 PM EST     SCr bumped with addition of Chlorthalidone.  Stop chlorthalidone and increase Toprol XL to 50mg  daily - followup in HTN clinic in 2 weeks   Sueanne Margarita, MD  11/02/2021  3:19 PM EST     Repeat BMET in 1 week   The patient has been notified of the result and verbalized understanding.  All questions (if any) were answered. Antonieta Iba, RN 11/04/2021 10:30 AM

## 2021-11-05 LAB — BASIC METABOLIC PANEL
BUN/Creatinine Ratio: 21 — ABNORMAL HIGH (ref 9–20)
BUN: 31 mg/dL — ABNORMAL HIGH (ref 6–24)
CO2: 27 mmol/L (ref 20–29)
Calcium: 9.6 mg/dL (ref 8.7–10.2)
Chloride: 96 mmol/L (ref 96–106)
Creatinine, Ser: 1.51 mg/dL — ABNORMAL HIGH (ref 0.76–1.27)
Glucose: 104 mg/dL — ABNORMAL HIGH (ref 70–99)
Potassium: 3.7 mmol/L (ref 3.5–5.2)
Sodium: 139 mmol/L (ref 134–144)
eGFR: 55 mL/min/{1.73_m2} — ABNORMAL LOW (ref >59)

## 2021-11-05 LAB — ALDOSTERONE + RENIN ACTIVITY W/ RATIO
ALDOS/RENIN RATIO: 3.1 (ref 0.0–30.0)
ALDOSTERONE: 11.2 ng/dL (ref 0.0–30.0)
Renin: 3.566 ng/mL/hr (ref 0.167–5.380)

## 2021-11-07 ENCOUNTER — Ambulatory Visit (INDEPENDENT_AMBULATORY_CARE_PROVIDER_SITE_OTHER): Payer: 59 | Admitting: Pharmacist

## 2021-11-07 ENCOUNTER — Ambulatory Visit (HOSPITAL_COMMUNITY): Payer: 59

## 2021-11-07 ENCOUNTER — Ambulatory Visit (HOSPITAL_COMMUNITY)
Admission: RE | Admit: 2021-11-07 | Discharge: 2021-11-07 | Disposition: A | Discharge status: Home / Self Care | Payer: 59 | Source: Ambulatory Visit | Attending: Internal Medicine | Admitting: Internal Medicine

## 2021-11-07 ENCOUNTER — Encounter: Payer: Self-pay | Admitting: Pharmacist

## 2021-11-07 ENCOUNTER — Other Ambulatory Visit: Payer: Self-pay

## 2021-11-07 ENCOUNTER — Encounter: Payer: Self-pay | Admitting: Cardiology

## 2021-11-07 VITALS — BP 124/84 | HR 71

## 2021-11-07 DIAGNOSIS — G4733 Obstructive sleep apnea (adult) (pediatric): Secondary | ICD-10-CM | POA: Insufficient documentation

## 2021-11-07 DIAGNOSIS — R0609 Other forms of dyspnea: Secondary | ICD-10-CM | POA: Diagnosis present

## 2021-11-07 DIAGNOSIS — I77810 Thoracic aortic ectasia: Secondary | ICD-10-CM | POA: Insufficient documentation

## 2021-11-07 DIAGNOSIS — I10 Essential (primary) hypertension: Secondary | ICD-10-CM | POA: Diagnosis present

## 2021-11-07 DIAGNOSIS — I701 Atherosclerosis of renal artery: Secondary | ICD-10-CM | POA: Insufficient documentation

## 2021-11-07 LAB — MYOCARDIAL PERFUSION IMAGING
Base ST Depression (mm): 0 mm
LV dias vol: 127 mL (ref 62–150)
LV sys vol: 71 mL
Nuc Stress EF: 44 %
Peak HR: 115 {beats}/min
Rest HR: 73 {beats}/min
Rest Nuclear Isotope Dose: 30.7 mCi
SDS: 2
SRS: 0
SSS: 2
ST Depression (mm): 0 mm
Stress Nuclear Isotope Dose: 32.7 mCi
TID: 1.34

## 2021-11-07 MED ORDER — TECHNETIUM TC 99M TETROFOSMIN IV KIT
30.7000 | PACK | Freq: Once | INTRAVENOUS | Status: AC | PRN
  Administered 2021-11-07: 30.7 via INTRAVENOUS
  Filled 2021-11-07: qty 31

## 2021-11-07 NOTE — Patient Instructions (Addendum)
Please check blood pressure once a day. Please bring your log and your blood pressure cuff with you to your next visit  Call me at [phone] with any questions  Hypertension "High blood pressure"  Hypertension is often called The Silent Killer. It rarely causes symptoms until it is extremely  high or has done damage to other organs in the body. For this reason, you should have your  blood pressure checked regularly by your physician. We will check your blood pressure  every time you see a provider at one of our offices.   Your blood pressure reading consists of two numbers. Ideally, blood pressure should be  below 120/80. The first (top) number is called the systolic pressure. It measures the  pressure in your arteries as your heart beats. The second (bottom) number is called the diastolic pressure. It measures the pressure in your arteries as the heart relaxes between beats.  The benefits of getting your blood pressure under control are enormous. A 10-point  reduction in systolic blood pressure can reduce your risk of stroke by 27% and heart failure by 28%  Your blood pressure goal is <130/80  To check your pressure at home you will need to:  1. Sit up in a chair, with feet flat on the floor and back supported. Do not cross your ankles or legs. 2. Rest your left arm so that the cuff is about heart level. If the cuff goes on your upper arm,  then just relax the arm on the table, arm of the chair or your lap. If you have a wrist cuff, we  suggest relaxing your wrist against your chest (think of it as Pledging the Flag with the  wrong arm).  3. Place the cuff snugly around your arm, about 1 inch above the crook of your elbow. The  cords should be inside the groove of your elbow.  4. Sit quietly, with the cuff in place, for about 5 minutes. After that 5 minutes press the power  button to start a reading. 5. Do not talk or move while the reading is taking place.  6. Record  your readings on a sheet of paper. Although most cuffs have a memory, it is often  easier to see a pattern developing when the numbers are all in front of you.  7. You can repeat the reading after 1-3 minutes if it is recommended  Make sure your bladder is empty and you have not had caffeine or tobacco within the last 30 min  Always bring your blood pressure log with you to your appointments. If you have not brought your monitor in to be double checked for accuracy, please bring it to your next appointment.  You can find a list of validated (accurate) blood pressure cuffs at [email or web address].novalidatebp.org  Healthy Diet  SALT  What is the big deal with sodium? Why the need to limit our intake? What is the connection to  blood pressure? And what is the difference between salt and sodium? Sodium attracts water. Think about it. When you eat an overly salty snack, you tend to become  thirsty and need more water. If you have too much sodium in your bloodstream, your body will  then pull water into the bloodstream as well, trying to correct the imbalance. When you have  more volume in the bloodstream, your blood pressure goes up. Your heart must work harder to  pump the extra volume, and the increase in pressure can wear out blood vessels  faster. You may  also notice bloating and weight gain. Hypertension is one of the leading risk factors for heart  disease. By limiting sodium intake throughout life you are helping decrease your risk of heart  disease later on.   Salt is made up of two minerals. Sodium and chloride. A teaspoon of salt contains about 40%  sodium and 60% chloride. One teaspoon of salt has 2,300 mg of sodium. While the current  USDA guideline states you should consume no more than 2,300 mg per day, both they and the  American Heart Association recommend that you limit this to 1,500 mg to stay healthy. Sea salt  or Himalayan pink salt may have a slightly different taste, but they still have  almost the same  percent of sodium per teaspoon. So feel free to use them instead of table salt, but dont use  more.  A common myth is that if you dont add salt to your food, you are following a low sodium diet.  However, 75% of the sodium consumed in the American diet is from processed foods, NOT the  salt shaker. We all know that chips and crackers are high in sodium, but there are many other  foods that we may not think of when limiting our sodium. Below are the salty six foods that  the American Heart Association wants you to be aware of. 1. Cold cuts - even the healthy sliced [place] can have over 1,000 mg of sodium per slice.   Compare different brands to see which has less sodium if you eat these regularly 2. Pizza - depending on your toppings, a slice of pizza can have up to 760 mg of   sodium. Put more veggies on it or just have a slice with a side salad and still enjoy. 3. Soup - yes even that old home remedy of chicken soup is loaded with sodium. Look   for low sodium versions. Or add a bunch of frozen veggies when heating it, this will   give you less sodium per serving 4. Breads - they may not taste salty, but a single slice of bread can have up to 230 mg of   sodium. Toast for breakfast, a sandwich at lunch and a dinner roll can quickly add up   to over 900 mg in just one day.  5. Chicken - some fresh or frozen chicken is injected with a sodium solution before it   reaches the store. A 4 oz serving should have no more than 100 mg sodium. And   watch for breaded frozen chicken nuggets, strips and tenders. They may seem like a   quick and easy healthy meal, but they have high amounts of sodium as well. 6. Burritos/tacos - just 2 teaspoons of taco seasoning can have over 400 mg sodium.   Try making your own with equal parts of cumin, oregano, chili powder and garlic powder.   SUGAR  Sugar is a huge problem in the modern day diet. Sugar is a HUGE contributor to heart  disease, diabetes, high triglyceride levels, fatty liver diease and obesity. Sugar is hidden in almost all packaged foods/beverages. It adds no nutritional benefit to your body and can cause major harm. Added sugar is extra sugar that is added beyond what is naturally found. The American Heart Association recommends limiting added sugars to no more than 25g for women and 36 grams for men per day.  There are many names for sugar maltose, sucrose (names ending  in "ose"), high fructose corn syrup, molasses, cane sugar, corn sweetener, raw sugar, syrup, honey or fruit juice concentrate.   One of the best ways to limit your added sugars is to stop drinking sweetened beverages such as soda, sweet tea, fruit juice or fancy coffee's. There is 65g of added sugars in one 20oz bottle of Coke!! That is equal to 6 donuts.   Pay attention and read all nutrition facts labels. Below is an examples of a nutrition facts label. The #1 is showing you the total sugars where the # 2 is showing you the added sugars. This one severing has almost the max amount of added sugars per day!  Watch out for items that say "low fat" or "no added sugar" as these products are typically very high in sugar. The food industry uses these terms to fool you into thinking they are healthy.  For more information on the dangers of sugar watch WHY Sugar is as Bad as Alcohol (Fructose, The Liver Toxin) on YouTube.    EXERCISE  Exercise can help lower your blood pressure ~5 points systolic (top #) and 8 points diastolic (bottom #)  Exercise is good. Weve all heard that. In an ideal world, we would all have time and resources to  get plenty of it. When you are active your heart pumps more efficiently and you will feel better.  Multiple studies show that even walking regularly has benefits that include living a longer life.  The American Heart Association recommends 90-150 minutes per week of exercise (30 minutes  per day most days of the  week). You can do this in any increment you wish. Nine or more  10-minute walks count. So does an hour-long exercise class. Break the time apart into what will  work in your life. Some of the best things you can do include walking briskly, jogging, cycling or  swimming laps. Not everyone is ready to exercise. Sometimes we need to start with just getting active. Here  are some easy ways to be more active throughout the day:  Take the stairs instead of the elevator  Go for a 10-15 minute walk during your lunch break (find a friend to make it more enjoyable)  When shopping, park at the back of the parking lot  If you take public transportation, get off one stop early and walk the extra distance  Pace around while making phone calls (most of [place] are not attached to phone cords any longer!) Check with your doctor if you arent sure what your limitations may be. Always remember to drink plenty of water when doing any type of exercise. Dont feel like a failure if youre not getting the 90-150 minutes per week. If you started by being  a couch potato, then just a 10-minute walk each day is a huge improvement. Start with little  victories and work your way up.   Healthy Eating Tips  When looking to improve your eating habits, whether to lose weight, lower blood pressure or just be healthier, it helps to know what a serving size is.   Grains 1 slice of bread,  bagel,  cup pasta or rice  Vegetables 1 cup fresh or raw vegetables,  cup cooked or canned Fruits 1 piece of medium sized fruit,  cup canned,   Meats/Proteins  cup dried       1 oz meat, 1 egg,  cup cooked beans, nuts or seeds  Dairy        Fats  Individual yogurt container, 1 cup (8oz)    1 teaspoon margarine/butter or vegetable  milk or milk alternative, 1 slice of cheese          oil; 1 tablespoon mayonnaise or salad dressing                  Plan ahead: make a menu of the meals for a week then create a grocery list to go  with  that menu. Consider meals that easily stretch into a night of leftovers, such as stews or  casseroles. Or consider making two of your favorite meal and put one in the freezer or fridge for  another night.  When you get home from the grocery store wash and prepare your vegetables and fruits.  Then when you need them they are ready to go.  Tips for going to the grocery store:  Buy store or generic brands  Check the weekly ad from your store on-line or in their in-store flyer  Look at the unit price on the shelf tag to compare/contrast the costs of different items  Buy fruits/vegetables in season  Carrots, bananas and apples are low-cost, naturally healthy items  If meats or frozen vegetables are on sale, buy some extras and put in your freezer  Limit buying prepared or ready to eat items, even if they are pre-made salads or fruit snacks  Do not shop when youre hungry  Foods at eye level tend to be more expensive. Look on the high and low shelves for deals.  Consider shopping at the farmers market for fresh foods in season.  Choose canned tuna or salmon instead of fresh  Avoid the cookie and chip aisles (these are expensive, high in calories and low in  nutritional value) Healthy food preparations:  If you cant get lean hamburger, be sure to drain the fat when cooking  Steam, saut (in olive oil), grill or bake foods  Experiment with different seasonings to avoid adding salt to your foods. Kosher salt, sea salt and Himalayan salt are all still salt and should be avoided          Aim to have one 12 hour fast each day. This means no eating after dinner until breakfast. For example, if you eat dinner around 6 PM then you would not eat anything until 6 AM the next day. This is a great way to help lower your insulin levels, lose weight and reduce your blood pressure.  Resources: American Heart Association - MartiniMobile.it Go to the Healthy Living tab to get more  information American Diabetes Association - www.diabetes.org You dont have to be diabetic - check out the Food and Fitness tab  DASH diet - PopSteam.is Health topics - or just search on their home page for DASH Quit for Life - www.cancer.org Follow the Stay Healthy tab to learn more about smoking cessation

## 2021-11-07 NOTE — Progress Notes (Signed)
Patient ID: Don [name]                 DOB: [birth year]                      MRN: [id number]     HPI: Don [name] is a 55 y.o. male referred by Dr. Radford Pax to HTN clinic. PMH is significant for asthma, allergic rhinitis, untreated sleep apnea, shortness of breath depression and hypertension. 2D echo was obtained on 06/20/2021 showed normal LV function with EF 55 to 60% with mild LVH.  There was grade 1 diastolic dysfunction. He saw Dr. Radford Pax on 10/16/21 for SOB on exertion. Blood pressure was 146/103 HR 104. He was not using his BIPAP. Chlorthalidone 25mg  daily and metoprolol succinate 25mg  daily was started. His scr bumped from 1.34 to 1.51 with the addition of chlorthalidone. Chlorthalidone was stopped and metoprolol was increased to 50mg  daily by Dr. Radford Pax. Renin/aldosterone, metanephrines and catecholamines labs were normal. Renal ultra sound done today.  Patient presents today to HTN clinic. He does not check his blood pressure at home, but does have a blood pressure cuff. Denies dizziness/lightheadedness. SOB on exertion. Some swelling in left ankle, but overall much improved. He has lost 30lb in the past month. Trying to eat better, cutting out sugars, drinking less beer. Cannot tolerate his CPAP mask. He did take his blood pressure medications this AM.   Current HTN meds: valsartan 320mg  daily, amlodipine 10mg  daily, metoprolol succinate 50mg  daily, furosemide 20mg  daily Previously tried: HCTZ, chlorthalidone BP goal: <130/80  Family History:  Family History  Problem Relation Age of Onset   Hypertension Mother    Breast cancer Mother    Diabetes Mother    Hypertension Father    Stroke Father    Allergic rhinitis Neg Hx    Angioedema Neg Hx    Asthma Neg Hx    Eczema Neg Hx    Immunodeficiency Neg Hx    Urticaria Neg Hx    Atopy Neg Hx     Social History: 2 beers on the weekend- use to drink more Social History   Socioeconomic History   Marital status: Married    Spouse  name: Not on file   Number of children: 3   Years of education: Not on file   Highest education level: Not on file  Occupational History   Occupation: Landscaping    Employer: [employer]  Tobacco Use   Smoking status: Former    Packs/day: 1.00    Years: 6.00    Pack years: 6.00    Types: Cigarettes    Quit date: 09/28/1990    Years since quitting: 31.1   Smokeless tobacco: Never  Vaping Use   Vaping Use: Never used  Substance and Sexual Activity   Alcohol use: Yes    Comment: SOCIAL    Drug use: No   Sexual activity: Not on file  Other Topics Concern   Not on file  Social History Narrative   Not on file   Social Determinants of Health   Financial Resource Strain: Not on file  Food Insecurity: Not on file  Transportation Needs: Not on file  Physical Activity: Not on file  Stress: Not on file  Social Connections: Not on file  Intimate Partner Violence: Not on file    Diet: 2-3 cups of coffee per day Chicken, salmon Low carbs, no sugar Removes sugar from his coffee Eating more greens  Exercise: gets SOB with  minimal activity  Home BP readings:   Wt Readings from Last 3 Encounters:  10/31/21 (!) 362 lb (164.2 kg)  10/16/21 (!) 362 lb (164.2 kg)  08/19/21 (!) 372 lb 3.2 oz (168.8 kg)   BP Readings from Last 3 Encounters:  10/16/21 (!) 146/102  08/19/21 130/80  05/27/21 138/82   Pulse Readings from Last 3 Encounters:  10/16/21 (!) 104  08/19/21 (!) 106  05/27/21 78    Renal function: Estimated Creatinine Clearance: 84.4 mL/min (A) (by C-G formula based on SCr of 1.51 mg/dL (H)).  Past Medical History:  Diagnosis Date   Asthma    Claustrophobia    Depression    Hypertension    Kidney stone    YRS AGO   PASSED ON OWN     Current Outpatient Medications on File Prior to Visit  Medication Sig Dispense Refill   metoprolol succinate (TOPROL-XL) 50 MG 24 hr tablet Take 1 tablet (50 mg total) by mouth daily. Take with or immediately following a  meal. 90 tablet 3   albuterol (PROVENTIL HFA) 108 (90 Base) MCG/ACT inhaler Inhale 2 puffs into the lungs every 4 (four) hours as needed for wheezing or shortness of breath. 6.7 each 1   amLODipine (NORVASC) 10 MG tablet Take 10 mg by mouth daily.     cetirizine (ZYRTEC) 10 MG tablet Take 1 tablet (10 mg total) by mouth daily. 30 tablet 5   desonide (DESOWEN) 0.05 % ointment 1 application 1-7 times per week to rash until resolved. 15 g 1   fluticasone (FLONASE) 50 MCG/ACT nasal spray Place 2 sprays into both nostrils daily. 16 g 5   Fluticasone-Umeclidin-Vilant (TRELEGY ELLIPTA) 200-62.5-25 MCG/INH AEPB Inhale 1 puff into the lungs daily. 60 each 5   furosemide (LASIX) 20 MG tablet Take 20 mg by mouth daily.     montelukast (SINGULAIR) 10 MG tablet Take 1 tablet (10 mg total) by mouth at bedtime. 90 tablet 1   sertraline (ZOLOFT) 50 MG tablet Take 50 mg by mouth daily.      tamsulosin (FLOMAX) 0.4 MG CAPS capsule Take 0.4 mg by mouth daily.     valsartan (DIOVAN) 320 MG tablet Take 320 mg by mouth daily.      Current Facility-Administered Medications on File Prior to Visit  Medication Dose Route Frequency Provider Last Rate Last Admin   tezepelumab-ekko (TEZSPIRE) 210 MG/1.91ML syringe 210 mg  210 mg Subcutaneous Q28 days Althea Charon, FNP   210 mg at 11/04/21 1543    Allergies  Allergen Reactions   Lisinopril Other (See Comments)    Muscle aches and fatigue.    There were no vitals taken for this visit.   Assessment/Plan:  1. Hypertension - Blood pressure is close to goal of <130/80 in clinic today. No home readings available. I have asked patient to start checking his blood pressure at home. Proper technique reviewed. He will see Dr. Radford Pax next week. Will also have his BMP rechecked next week. Follow up in clinic with me in 1 month.  2. Obesity- Patient has been successfully in loosing weight. I congratulated him on his diet changes. We did discuss Wegovy briefly if in the future  he hits a stall and would like [place] to look into coverage.   Thank you  Ramond Dial, Pharm.D, BCPS, CPP [place]  [id number] [place], [place] [address]  Phone: [phone]; Fax: [phone]

## 2021-11-12 ENCOUNTER — Other Ambulatory Visit (HOSPITAL_COMMUNITY): Payer: 59

## 2021-11-13 ENCOUNTER — Ambulatory Visit: Payer: 59 | Admitting: Cardiology

## 2021-11-14 ENCOUNTER — Other Ambulatory Visit (HOSPITAL_BASED_OUTPATIENT_CLINIC_OR_DEPARTMENT_OTHER): Payer: 59

## 2021-11-14 ENCOUNTER — Other Ambulatory Visit (HOSPITAL_COMMUNITY): Payer: 59

## 2021-11-14 ENCOUNTER — Other Ambulatory Visit: Payer: Self-pay

## 2021-11-14 ENCOUNTER — Encounter: Payer: Self-pay | Admitting: Cardiology

## 2021-11-14 ENCOUNTER — Other Ambulatory Visit: Payer: 59 | Admitting: *Deleted

## 2021-11-14 ENCOUNTER — Encounter (HOSPITAL_COMMUNITY): Payer: Self-pay

## 2021-11-14 ENCOUNTER — Ambulatory Visit (INDEPENDENT_AMBULATORY_CARE_PROVIDER_SITE_OTHER): Payer: 59 | Admitting: Cardiology

## 2021-11-14 ENCOUNTER — Telehealth: Payer: Self-pay | Admitting: Pulmonary Disease

## 2021-11-14 VITALS — Ht 68.0 in | Wt 341.8 lb

## 2021-11-14 DIAGNOSIS — R0609 Other forms of dyspnea: Secondary | ICD-10-CM | POA: Diagnosis not present

## 2021-11-14 DIAGNOSIS — I10 Essential (primary) hypertension: Secondary | ICD-10-CM

## 2021-11-14 DIAGNOSIS — I77810 Thoracic aortic ectasia: Secondary | ICD-10-CM

## 2021-11-14 DIAGNOSIS — I701 Atherosclerosis of renal artery: Secondary | ICD-10-CM

## 2021-11-14 DIAGNOSIS — G4733 Obstructive sleep apnea (adult) (pediatric): Secondary | ICD-10-CM

## 2021-11-14 LAB — BASIC METABOLIC PANEL
BUN/Creatinine Ratio: 18 (ref 9–20)
BUN: 19 mg/dL (ref 6–24)
CO2: 23 mmol/L (ref 20–29)
Calcium: 9.5 mg/dL (ref 8.7–10.2)
Chloride: 99 mmol/L (ref 96–106)
Creatinine, Ser: 1.05 mg/dL (ref 0.76–1.27)
Glucose: 102 mg/dL — ABNORMAL HIGH (ref 70–99)
Potassium: 4.5 mmol/L (ref 3.5–5.2)
Sodium: 137 mmol/L (ref 134–144)
eGFR: 84 mL/min/{1.73_m2} (ref >59)

## 2021-11-14 NOTE — Addendum Note (Signed)
Addended by: Burnetta Sabin on: 11/14/2021 09:59 AM   Modules accepted: Orders

## 2021-11-14 NOTE — Progress Notes (Signed)
Cardiology Office Note    Date:  11/14/2021   ID:  Don [name], DOB 02/10/1966, MRN [id number]  PCP:  Don Jimmerson Sermon, PA-C  Cardiologist:  Don Him, MD   Chief Complaint  Patient presents with   Follow-up    Dyspnea on exertion, hypertension, dilated ascending aorta, renal artery stenosis    History of Present Illness:  Don [name] is a 56 y.o. male  with a history of asthma, allergic rhinitis, untreated sleep apnea, shortness of breath depression and hypertension.  He has a history of tobacco use.  He was recently seen by Dr. Neldon [name] his allergist and was still complaining of dyspnea on exertion.  Apparently all his complaints of coughing and wheezing and resting shortness of breath completely resolved on his anti-- TSL P antibody and a combination of anti-inflammatory agents for upper and lower airways.  He is also been having lower extremity edema.  2D echo was obtained on 06/20/2021 showed normal LV function with EF 55 to 60% with mild LVH.  There was grade 1 diastolic dysfunction.  The right ventricle was mildly enlarged.  There was mild dilatation of the ascending aorta at 41 mm.  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  There was no mention of an enlarged aorta but this was a noncontrasted study.  He had a chest CT angio in 2017 that showed no dilatation or aortic aneurysm.  He has had chronic DOE but after getting COVID 19 his sx of DOE significantly worsened.    Nuclear stress test on 11/07/2021 showed no ischemia but reduced LV function and it was recommended repeat echo be done but it has not been performed yet. TSH, PRA, aldo and Renin levels were normal.  He had mild to moderate bilateral renal artery stenosis 1-59% on renal dopplers.  Chest CTA showed mildly dilated ascending aorta at 4cm.   He is here today for followup and is doing well.  He  still has DOE but thinks it is better with some weight loss.  He also thinks his chronic LE edema has improved.  He denies any chest pain or pressure,  PND, orthopnea, palpitations or syncope. He is compliant with his meds and is tolerating meds with no SE.      Past Medical History:  Diagnosis Date   Asthma    Claustrophobia    Depression    Hypertension    Kidney stone    YRS AGO   PASSED ON OWN    Renal artery stenosis (HCC)    bilateral 1-59% by dopplers 10/2021    Past Surgical History:  Procedure Laterality Date   ANKLE SURGERY     RIGHT   LASIK     LUMBAR LAMINECTOMY/DECOMPRESSION MICRODISCECTOMY Right 02/28/2013   Procedure: LUMBAR LAMINECTOMY/DECOMPRESSION MICRODISCECTOMY 1 LEVEL;  Surgeon: Erline Levine, MD;  Location: [facility];  Service: Neurosurgery;  Laterality: Right;  Right Lumbar Four-Five Laminectomy   MAXIMUM ACCESS (MAS)POSTERIOR LUMBAR INTERBODY FUSION (PLIF) 1 LEVEL N/A 04/23/2015   Procedure: L4-5 Maximum access posterior lumbar interbody fusion;  Surgeon: Erline Levine, MD;  Location: [place] [facility];  Service: Neurosurgery;  Laterality: N/A;  L4-5 Maximum access posterior lumbar interbody fusion   RADIOLOGY WITH ANESTHESIA N/A 02/05/2015   Procedure: MRI - Lumbar Spine;  Surgeon: Medication Radiologist, MD;  Location: [facility];  Service: Radiology;  Laterality: N/A;   TONSILLECTOMY  AS CHILD      Current Medications: Current Meds  Medication Sig   albuterol (PROVENTIL HFA) 108 (90 Base) MCG/ACT inhaler Inhale 2 puffs into the lungs every 4 (four) hours as needed for wheezing or shortness of breath.   amLODipine (NORVASC) 10 MG tablet Take 5 mg by mouth daily.   cetirizine (ZYRTEC) 10 MG tablet Take 1 tablet (10 mg total) by mouth daily.   fluticasone (FLONASE) 50 MCG/ACT nasal spray Place 2 sprays into both nostrils daily.   Fluticasone-Umeclidin-Vilant (TRELEGY ELLIPTA) 200-62.5-25 MCG/INH AEPB Inhale 1 puff into the lungs daily.   furosemide (LASIX) 40 MG  tablet Take 40 mg by mouth daily.   metoprolol succinate (TOPROL-XL) 50 MG 24 hr tablet Take 1 tablet (50 mg total) by mouth daily. Take with or immediately following a meal.   montelukast (SINGULAIR) 10 MG tablet Take 1 tablet (10 mg total) by mouth at bedtime.   sertraline (ZOLOFT) 50 MG tablet Take 50 mg by mouth daily.    tamsulosin (FLOMAX) 0.4 MG CAPS capsule Take 0.4 mg by mouth daily.   valsartan (DIOVAN) 320 MG tablet Take 320 mg by mouth daily.    Current Facility-Administered Medications for the 11/14/21 encounter (Office Visit) with Sueanne Margarita, MD  Medication   tezepelumab-ekko (TEZSPIRE) 210 MG/1.91ML syringe 210 mg    Allergies:   Lisinopril   Social History   Socioeconomic History   Marital status: Married    Spouse name: Not on file   Number of children: 3   Years of education: Not on file   Highest education level: Not on file  Occupational History   Occupation: Landscaping    Employer: [employer]  Tobacco Use   Smoking status: Former    Packs/day: 1.00    Years: 6.00    Pack years: 6.00    Types: Cigarettes    Quit date: 09/28/1990    Years since quitting: 31.1   Smokeless tobacco: Never  Vaping Use   Vaping Use: Never used  Substance and Sexual Activity   Alcohol use: Yes    Comment: SOCIAL    Drug use: No   Sexual activity: Not on file  Other Topics Concern   Not on file  Social History Narrative   Not on file   Social Determinants of Health   Financial Resource Strain: Not on file  Food Insecurity: Not on file  Transportation Needs: Not on file  Physical Activity: Not on file  Stress: Not on file  Social Connections: Not on file     Family History:  The patient's family history includes Breast cancer in his mother; Diabetes in his mother; Hypertension in his father and mother; Stroke in his father.   ROS:   Please see the history of present illness.    ROS All other systems reviewed and are negative.  PAD Screen 09/22/2016   Previous PAD dx? No  Previous surgical procedure? No  Pain with walking? No  Feet/toe relief with dangling? No  Painful, non-healing ulcers? No  Extremities discolored? No       PHYSICAL EXAM:   VS:  Ht 5\' 8"  (1.727 m)    Wt (!) 341 lb 12.8 oz (155 kg)    BMI 51.97 kg/m    GEN: Well nourished, well developed in no acute distress HEENT: Normal NECK: No JVD; No carotid bruits LYMPHATICS: No lymphadenopathy CARDIAC:RRR, no murmurs, rubs, gallops RESPIRATORY:  Clear to auscultation without rales, wheezing or rhonchi  ABDOMEN: Soft,  non-tender, non-distended MUSCULOSKELETAL:  No edema; No deformity  SKIN: Warm and dry NEUROLOGIC:  Alert and oriented x 3 PSYCHIATRIC:  Normal affect   Wt Readings from Last 3 Encounters:  11/14/21 (!) 341 lb 12.8 oz (155 kg)  10/31/21 (!) 362 lb (164.2 kg)  10/16/21 (!) 362 lb (164.2 kg)      Studies/Labs Reviewed:   EKG:  EKG is not ordered today.    Recent Labs: 04/01/2021: ALT 20; Hemoglobin 16.3; TSH 2.040 10/30/2021: BUN 31; Creatinine, Ser 1.51; Potassium 3.7; Sodium 139   Lipid Panel    Component Value Date/Time   TRIG 64 09/01/2019 1422     Additional studies/ records that were reviewed today include:  Office visit notes and 2D echo from Dr. Bruna Potter office    ASSESSMENT:    1. DOE (dyspnea on exertion)   2. Essential hypertension   3. OSA (obstructive sleep apnea)   4. Ascending aorta dilatation (HCC)   5. Renal artery stenosis (HCC)      PLAN:  In order of problems listed above:  Dyspnea on exertion -Suspect this is multifactorial related to his underlying asthma and allergies but also possible pulmonary fibrosis as documented by a high-resolution chest CT on 12/11/2019.  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   -He was seen by pulmonary in February 2022>>he has chronic DOE but worsened  significantly after COVID 19 infection -Pulmonary function tests in March 2021 were normal -He has severe obstructive sleep apnea with a sleep study showing an AHI of 153/h by an PSG in 2021 and he was placed on BiPAP at this is been followed by Dr. Elsworth Soho.  He stopped using his BiPAP. -He does have a significant history of tobacco use , obesity and poorly controlled HTN and therefore need to also consider that he could have underlying CAD. -Unfortunately his BMI prohibits coronary CTA imaging to assess for CAD -Nuclear stress test showed no inducible ischemia but mildly reduced LV function -Repeat 2D echo is pending to reassess LV function -His blood pressures has been poorly controlled and suspect this is contributing to his shortness of breath.  2.  Obstructive sleep apnea -This is severe and he had been on BiPAP but stopped using it -He had mild RV dilatation on his 2D echo with no pulmonary hypertension and likely the etiology of this is related to his sleep apnea and chronic hypoxemia at night -I suspect that his BP is being driven by untreated OSA -He is seeing Dr. Elsworth Soho for this>>I will get [name] an appt   3.  Hypertension -His BP is poorly controlled and likely multifactorial from morbid obesity, untreated obstructive sleep apnea and essential hypertension -24-hour urine for catecholamines, metanephrines, VMA, dopamine was normal -Renal duplex showed 1 to 59% bilateral renal artery stenosis. -Plasma renin, aldosterone and PRA ratio were normal -At last OV was started on Chlorthalidone 25mg  daily and Toprol XL 25mg  daily but SCr bumped so diuretic was stopped and Toprol increased to 50mg  daily -Bp is improved today -I will get a 48hr BP monitor to assess BP further since his home BP cuff that he brought today is reading high -Continue prescription drug management with amlodipine 5mg  daily, Diovan 320 mg daily, Toprol-XL 50 mg daily with as needed refills   4.  Dilated ascending thoracic  aorta -Recent chest CTA showed a 4 cm ascending thoracic aorta -will repeat in 1  year to make sure this is stable -Needs aggressive therapy for blood pressure control  5.  Renal artery stenosis -he has bilateral renal artery stenosis of 1 to 59% -Continue statin therapy we will need to follow this closely  6.  Morbid Obesity -we talked about weight loss surgery and he is not interested -He is not interested in Healthy Weight and Wellness program  Followup with me in 3 months  Time Spent: 20 minutes total time of encounter, including 15 minutes spent in face-to-face patient care on the date of this encounter. This time includes coordination of care and counseling regarding above mentioned problem list. Remainder of non-face-to-face time involved reviewing chart documents/testing relevant to the patient encounter and documentation in the medical record. I have independently reviewed documentation from referring provider  Medication Adjustments/Labs and Tests Ordered: Current medicines are reviewed at length with the patient today.  Concerns regarding medicines are outlined above.  Medication changes, Labs and Tests ordered today are listed in the Patient Instructions below.  There are no Patient Instructions on file for this visit.   Signed, Don Him, MD  11/14/2021 9:28 AM    [address], [place], [place]  [address] Phone: [phone]; Fax: [phone]

## 2021-11-14 NOTE — Telephone Encounter (Signed)
Called patient directly and he did not answer. His VM is not setup. Will try again on Monday. At time of call, RA had an opening on 02/22 at 3pm.

## 2021-11-14 NOTE — Patient Instructions (Addendum)
Medication Instructions:  Your physician recommends that you continue on your current medications as directed. Please refer to the Current Medication list given to you today.  *If you need a refill on your cardiac medications before your next appointment, please call your pharmacy*   Lab Work: None ordered  If you have labs (blood work) drawn today and your tests are completely normal, you will receive your results only by: MyChart Message (if you have MyChart) OR A paper copy in the mail If you have any lab test that is abnormal or we need to change your treatment, we will call you to review the results.   Testing/Procedures: Your physician has requested that you have an echocardiogram. Echocardiography is a painless test that uses sound waves to create images of your heart. It provides your doctor with information about the size and shape of your heart and how well your hearts chambers and valves are working. This procedure takes approximately one hour. There are no restrictions for this procedure.  Your physician has requested that you wear a 48 hour blood pressure monitor. .    Follow-Up: At [facility], you and your health needs are our priority.  As part of our continuing mission to provide you with exceptional heart care, we have created designated Provider Care Teams.  These Care Teams include your primary Cardiologist (physician) and Advanced Practice Providers (APPs -  Physician Assistants and Nurse Practitioners) who all work together to provide you with the care you need, when you need it.  We recommend signing up for the patient portal called "MyChart".  Sign up information is provided on this After Visit Summary.  MyChart is used to connect with patients for Virtual Visits (Telemedicine).  Patients are able to view lab/test results, encounter notes, upcoming appointments, etc.  Non-urgent messages can be sent to your provider as well.   To learn more about what you can do  with MyChart, go to [email or web address].    Your next appointment:   3 month(s)  02/03/22 ARRIVE AT 11:30  The format for your next appointment:   In Person  Provider:   Armanda Magic, MD     Other Instructions You have a follow-up appointment with Dr. Maceo Pro Pulmonolgy, regarding your sleep apnea,

## 2021-11-26 ENCOUNTER — Telehealth: Payer: Self-pay | Admitting: *Deleted

## 2021-11-26 NOTE — Telephone Encounter (Signed)
Attempting to contact patient to schedule 24 Hour ambulatory blood pressure monitor.  No answer, voice mail has not been set up. ?

## 2021-11-28 ENCOUNTER — Ambulatory Visit: Payer: 59 | Admitting: Adult Health

## 2021-11-28 ENCOUNTER — Encounter: Payer: Self-pay | Admitting: Adult Health

## 2021-11-28 ENCOUNTER — Other Ambulatory Visit: Payer: Self-pay

## 2021-11-28 DIAGNOSIS — R0609 Other forms of dyspnea: Secondary | ICD-10-CM

## 2021-11-28 DIAGNOSIS — J8410 Pulmonary fibrosis, unspecified: Secondary | ICD-10-CM | POA: Insufficient documentation

## 2021-11-28 DIAGNOSIS — G4733 Obstructive sleep apnea (adult) (pediatric): Secondary | ICD-10-CM

## 2021-11-28 DIAGNOSIS — R0600 Dyspnea, unspecified: Secondary | ICD-10-CM | POA: Insufficient documentation

## 2021-11-28 NOTE — Assessment & Plan Note (Signed)
Dyspnea with exertion-suspect this is multifactorial.  Patient does have a postinflammatory fibrosis, along with physical deconditioning, morbid obesity, worry for possible underlying pulmonary hypertension as he has untreated very severe sleep apnea.  He has no desaturations with ambulation today in the office. ?Asthma does appear to be under good control. ?For now we will check PFTs on return.  If significant decline consider a repeat high-res CT ?Agree with upcoming echo would definitely evaluate for possible pulmonary hypertension as he is at risk ? ?Plan ?Patient Instructions  ?Work on healthy weight .  ?Continue on Trelegy 1 puff daily, rinse after use ?Albuterol inhaler or nebulizer as needed for wheezing. ?Continue follow up with allergist.  ?Activity as tolerated.  ?Follow up in 4 weeks Dr. Vassie Loll with PFTs and As needed   ?Please contact office for sooner follow up if symptoms do not improve or worsen or seek emergency care  ? ?  ? ? ?

## 2021-11-28 NOTE — Progress Notes (Signed)
@Patient  ID: Don [name], male    DOB: August 18, 1966, 56 y.o.   MRN: [id number]  Chief Complaint  Patient presents with   Follow-up    Referring provider: Traci Sermon, [place]*  HPI: 56 year old male former smoker minimal smoking history morbidly obese followed for severe persistent asthma and obstructive sleep apnea History of acute respiratory failure with ARDS secondary to COVID-19 infection in November 2020  TEST/EVENTS :  HRCT 11/2019 >> post Covid inflammatory fibrosis   PFTs 11/2019 FEV1 86%, ratio 82, FVC 81%, no significant bronchodilator response, DLCO 94%.  NPSG 09/2019 >> AHI at 153/hour, SPO2 low 79%, BiPAP 18/14   2020 spirometry-ratio 87, FEV1 1.97-54%, FVC 2.2 648% 09/2018 spirometry-ratio 80, FEV1 86%, FVC 84% 09/2018 eos 0  Home sleep study April 2019 showed severe sleep apnea with REI 73/hour severe desaturations with minimum O2 saturation at 58%.  Weaned off of oxygen March 2021  11/28/2021 Follow up: Asthma, obstructive sleep apnea, postinflammatory fibrosis Patient returns for 1 year follow-up.  Patient has underlying asthma and allergies.  Says overall asthma is doing okay but remains very short of breath.with minimal activity.  Had no flare of cough or wheezing.  He remains on Trelegy inhaler daily.  No increased albuterol use.  He is on Tezspire injections monthly. Activity level remains at baseline. Uses Flonase and Zyrtec as needed.. Follows with Allergist.  Previous PFTs 6 months after his COVID showed normal lung function and diffusing capacity.  Previous with critical illness 07/2019, with covid 19 with ARDS.   Patient has underlying obstructive sleep apnea.  He is suppose to be on BIPAP . Says he could not wear. Turned back into DME. Says he is absolutely can not wear it and does not want to retry.   Patient with recent CT chest angio/aorta.  Lung portion with no suspicious pulmonary nodules noted. Recent cardiac evaluation for dyspnea . Told he  needs to see pulmonary to evaluation dyspnea.  He has an upcoming echo next week..  Patient says that he has trouble walking long distance.  Gets short of breath if he tries to go up an incline or stairs.  If he has to carry something heavy he gets short of breath and wears out of energy.  Today in the office walk test shows no significant desaturations with O2 saturation at 93% on room air  Is being followed by PCP for UTI currently on Bactrim and flomax .   Works for [employer].  Finding it harder and harder to complete his job due to his shortness of breath.    Allergies  Allergen Reactions   Lisinopril Other (See Comments)    Muscle aches and fatigue.    Immunization History  Administered Date(s) Administered   Influenza,inj,Quad PF,6+ Mos 08/07/2016, 08/04/2019, 06/27/2021   Influenza,inj,quad, With Preservative 07/31/2015   PFIZER Comirnaty(Gray Top)Covid-19 Tri-Sucrose Vaccine 12/23/2019, 01/13/2020, 09/15/2020   Pneumococcal Polysaccharide-23 03/16/2014   Td 05/20/1986   Tdap 05/20/2012, 04/03/2019    Past Medical History:  Diagnosis Date   Asthma    Claustrophobia    Depression    Hypertension    Kidney stone    YRS AGO   PASSED ON OWN    OSA (obstructive sleep apnea)    Followed by Dr. Elsworth Soho   Renal artery stenosis [facility])    bilateral 1-59% by dopplers 10/2021    Tobacco History: Social History   Tobacco Use  Smoking Status Former   Packs/day: 1.00   Years: 6.00  Pack years: 6.00   Types: Cigarettes   Quit date: 09/28/1990   Years since quitting: 31.1  Smokeless Tobacco Never   Counseling given: Not Answered   Outpatient Medications Prior to Visit  Medication Sig Dispense Refill   albuterol (PROVENTIL HFA) 108 (90 Base) MCG/ACT inhaler Inhale 2 puffs into the lungs every 4 (four) hours as needed for wheezing or shortness of breath. 6.7 each 1   amLODipine (NORVASC) 10 MG tablet Take 5 mg by mouth daily.     Fluticasone-Umeclidin-Vilant (TRELEGY  ELLIPTA) 200-62.5-25 MCG/INH AEPB Inhale 1 puff into the lungs daily. 60 each 5   furosemide (LASIX) 40 MG tablet Take 40 mg by mouth daily.     metoprolol succinate (TOPROL-XL) 50 MG 24 hr tablet Take 1 tablet (50 mg total) by mouth daily. Take with or immediately following a meal. 90 tablet 3   sertraline (ZOLOFT) 50 MG tablet Take 50 mg by mouth daily. 1 1/2 tabs 75 mg     sulfamethoxazole-trimethoprim (BACTRIM DS) 800-160 MG tablet Take 1 tablet by mouth 2 (two) times daily.     tamsulosin (FLOMAX) 0.4 MG CAPS capsule Take 0.4 mg by mouth daily.     valsartan (DIOVAN) 320 MG tablet Take 320 mg by mouth daily.      cetirizine (ZYRTEC) 10 MG tablet Take 1 tablet (10 mg total) by mouth daily. (Patient not taking: Reported on 11/28/2021) 30 tablet 5   fluticasone (FLONASE) 50 MCG/ACT nasal spray Place 2 sprays into both nostrils daily. (Patient not taking: Reported on 11/28/2021) 16 g 5   montelukast (SINGULAIR) 10 MG tablet Take 1 tablet (10 mg total) by mouth at bedtime. (Patient not taking: Reported on 11/28/2021) 90 tablet 1   Facility-Administered Medications Prior to Visit  Medication Dose Route Frequency Provider Last Rate Last Admin   tezepelumab-ekko (TEZSPIRE) 210 MG/1.91ML syringe 210 mg  210 mg Subcutaneous Q28 days Althea Charon, FNP   210 mg at 11/04/21 1543     Review of Systems:   Constitutional:   No  weight loss, night sweats,  Fevers, chills,  +fatigue, or  lassitude.  HEENT:   No headaches,  Difficulty swallowing,  Tooth/dental problems, or  Sore throat,                No sneezing, itching, ear ache, nasal congestion, post nasal drip,   CV:  No chest pain,  Orthopnea, PND, , anasarca, dizziness, palpitations, syncope.   GI  No heartburn, indigestion, abdominal pain, nausea, vomiting, diarrhea, change in bowel habits, loss of appetite, bloody stools.   Resp:   No chest wall deformity  Skin: no rash or lesions.  GU: no dysuria, change in color of urine, no urgency or  frequency.  No flank pain, no hematuria   MS:  No joint pain or swelling.  No decreased range of motion.  No back pain.    Physical Exam  BP 114/66 (BP Location: Left Arm, Patient Position: Sitting, Cuff Size: Large)    Pulse (!) 118    Temp 98.9 F (37.2 C) (Oral)    SpO2 95%   GEN: A/Ox3; pleasant , NAD, well nourished    HEENT:  [place]/AT,  EACs-clear, TMs-wnl, NOSE-clear, THROAT-clear, no lesions, no postnasal drip or exudate noted.  Class III-IV MP airway  NECK:  Supple w/ fair ROM; no JVD; normal carotid impulses w/o bruits; no thyromegaly or nodules palpated; no lymphadenopathy.    RESP  Clear  P & A; w/o, wheezes/ rales/ or  rhonchi. no accessory muscle use, no dullness to percussion  CARD:  RRR, no m/r/g, tr  peripheral edema, pulses intact, no cyanosis or clubbing.  GI:   Soft & nt; nml bowel sounds; no organomegaly or masses detected.   Musco: Warm bil, no deformities or joint swelling noted.   Neuro: alert, no focal deficits noted.    Skin: Warm, no lesions or rashes    Lab Results:  CBC     BNP   ProBNP No results found for: PROBNP  Imaging: CT ANGIO CHEST AORTA W/CM & OR WO/CM  Result Date: 10/31/2021 CLINICAL DATA:  Aortic aneurysm. EXAM: CT ANGIOGRAPHY CHEST WITH CONTRAST TECHNIQUE: Multidetector CT imaging of the chest was performed using the standard protocol during bolus administration of intravenous contrast. Multiplanar CT image reconstructions and MIPs were obtained to evaluate the vascular anatomy. RADIATION DOSE REDUCTION: This exam was performed according to the departmental dose-optimization program which includes automated exposure control, adjustment of the mA and/or kV according to patient size and/or use of iterative reconstruction technique. CONTRAST:  100mL OMNIPAQUE IOHEXOL 350 MG/ML SOLN COMPARISON:  CT chest, 12/11/2019.  Chest XR, 11/03/2019. FINDINGS: CARDIOVASCULAR: Limitations by motion: Moderate Preferential opacification of the  thoracic aorta. No evidence of thoracic aortic aneurysm or dissection. Aortic Root: --Valve: 2.5 cm --Sinuses: 3.8 cm --Sinotubular Junction: 3.5 cm Thoracic Aorta: --Ascending Aorta: 4.0 cm --Aortic Arch: 3.5 cm --Descending Aorta: 0.7 cm Other: Conventional 3-sided LEFT aortic arch. No atherosclerosis or hemodynamic stenosis at the origins of the great vessels. Normal heart size.  No pericardial effusion. Mild dilation of the main PA, measuring 3.2 cm. Mediastinum/Nodes: No enlarged mediastinal, hilar, or axillary lymph nodes. Thyroid gland, trachea, and esophagus demonstrate no significant findings. Lungs/Pleura: No suspicious pulmonary nodule or mass. No pleural effusion or pneumothorax. Upper Abdomen: Hepatic steatosis. Large, exophytic LEFT renal cyst, measuring up to 12 cm. Incidental, variant splanchnic anatomy with absent celiac trunk and separate aortic origins of the common hepatic and splenic arteries. Musculoskeletal: Chronic LEFT rib deformity with nonunion. No acute chest wall abnormality. No acute osseous findings. Review of the MIP images confirms the above findings. IMPRESSION: 1. 4.0 cm ascending thoracic aorta. Continue annual imaging followup by CTA or MRA. This recommendation follows 2010 ACCF/AHA/AATS/ACR/ASA/SCA/SCAI/SIR/STS/SVM Guidelines for the Diagnosis and Management of Patients with Thoracic Aortic Disease. Circulation. 2010; 121: Z610-R604: E266-e369. Aortic aneurysm NOS (ICD10-I71.9) 2. Main pulmonary artery dilation, likely reflecting underlying pulmonary arterial hypertension. 3. Incidental, 12 cm LEFT renal cyst. 4.  Additional incidental, chronic and senescent findings as above. Electronically Signed   By: Roanna BanningJon  Mugweru M.D.   On: 10/31/2021 11:19   MYOCARDIAL PERFUSION IMAGING  Result Date: 11/07/2021   ECG is normal. ECG rhythm shows normal sinus rhythm. Resting ECG shows no ST-segment deviation.   There were no arrhythmias during stress. There were no arrhythmias during recovery. ECG  was interpretable and without significant changes. The ECG was not diagnostic due to pharmacologic protocol.   Overall image quality is good. Diaphragmatic attenuation artifact was present.   LV perfusion is normal. There is no evidence of ischemia. There is no evidence of infarction.   Left ventricular function is abnormal by measurement but visually appears normal. Nuclear stress EF calculated at 44 % but visually appears at least 50-55%. End diastolic cavity size is mildly enlarged. End systolic cavity size is mildly enlarged. Evidence of transient ischemic dilation (TID) noted. TID was appreciated quantitatively but not visually.   Prior study not available for comparison.   The study  is intermediate risk due to presence of transient ischemic dilatation which can indicate multivessel CAD.   Recommend 2D echo to reassess LVF.   VAS US RENAL ARTERY DUPLEX  Result Date: 11/07/2021 ABDOMINAL VISCERAL Patient Name:  Don [name]  Date of Exam:   11/07/2021 Medical Rec #: [id number]      Accession #:    [id number] Date of Birth: 12/06/1965      Patient Gender: M Patient Age:   79 years Exam Location:  [facility] Procedure:      VAS US RENAL ARTERY DUPLEX Referring Phys: [id number] TRACI R TURNER -------------------------------------------------------------------------------- High Risk Factors: Hypertension, past history of smoking. Limitations: Air/bowel gas and obesity. Comparison Study: NA Performing Technologist: Sharlett Iles RVT  Examination Guidelines: A complete evaluation includes B-mode imaging, spectral Doppler, color Doppler, and power Doppler as needed of all accessible portions of each vessel. Bilateral testing is considered an integral part of a complete examination. Limited examinations for reoccurring indications may be performed as noted.  Duplex Findings: +--------------------+--------+--------+------+----------------------+  Mesenteric           PSV cm/s EDV cm/s Plaque        Comments          +--------------------+--------+--------+------+----------------------+  Aorta Prox             102                    3.0 cm AP x 3.0 cm TRV  +--------------------+--------+--------+------+----------------------+  Aorta Mid                                     2.2 cm AP x 2.2 cm TRV  +--------------------+--------+--------+------+----------------------+  Aorta Distal            98                    2.1 cm AP x 2.1 cm TRV  +--------------------+--------+--------+------+----------------------+  Celiac Artery Origin   182                                            +--------------------+--------+--------+------+----------------------+  SMA Proximal            93       11                                   +--------------------+--------+--------+------+----------------------+    +------------------+--------+--------+-------+  Right Renal Artery PSV cm/s EDV cm/s Comment  +------------------+--------+--------+-------+  Origin               123       39             +------------------+--------+--------+-------+  Proximal             158       52             +------------------+--------+--------+-------+  Mid                  187       64             +------------------+--------+--------+-------+  Distal               123  39             +------------------+--------+--------+-------+ +-----------------+--------+--------+---------------+  Left Renal Artery PSV cm/s EDV cm/s     Comment      +-----------------+--------+--------+---------------+  Origin               80       20                     +-----------------+--------+--------+---------------+  Proximal            153       51    mild tortuosity  +-----------------+--------+--------+---------------+  Mid                 162       44    mild tortuosity  +-----------------+--------+--------+---------------+  Distal              110       29                     +-----------------+--------+--------+---------------+  Technologist observations: Avascular cystic mass  noted in the upper pole of both kidneys, with the right measuring 1.7 cm, and the left measuring 11.9 x 8.8 x 12.6 cm. +------------+--------+--------+----+-----------+--------+--------+------------+  Right Kidney PSV cm/s EDV cm/s RI   Left Kidney PSV cm/s EDV cm/s RI            +------------+--------+--------+----+-----------+--------+--------+------------+  Upper Pole   16       5        0.66 Upper Pole                    not                                                                              visualized,                                                                      obscured by                                                                      large renal                                                                      cyst          +------------+--------+--------+----+-----------+--------+--------+------------+  Mid          16  7        0.60 Mid         22       9        0.62          +------------+--------+--------+----+-----------+--------+--------+------------+  Lower Pole   26       8        0.68 Lower Pole  14       5        0.63          +------------+--------+--------+----+-----------+--------+--------+------------+  Hilar        38       11       0.71 Hilar       50       15       0.70          +------------+--------+--------+----+-----------+--------+--------+------------+ +------------------+-------+------------------+-------+  Right Kidney               Left Kidney                 +------------------+-------+------------------+-------+  RAR                        RAR                         +------------------+-------+------------------+-------+  RAR (manual)       1.83    RAR (manual)       1.59     +------------------+-------+------------------+-------+  Cortex             1.02 cm Cortex             1.08 cm  +------------------+-------+------------------+-------+  Cortex thickness           Corex thickness             +------------------+-------+------------------+-------+   Kidney length (cm) 12.2    Kidney length (cm) 13.5     +------------------+-------+------------------+-------+  Summary: Largest Aortic Diameter: 3.0 cm  Renal:  Right: Normal size right kidney. Normal right Resistive Index.        Normal cortical thickness of right kidney. 1-59% stenosis of        the right renal artery. RRV flow present. Cyst(s) noted.        Avascular cystic mass noted in the upper pole, measuring 1.7        cm. Left:  Normal size of left kidney. Normal left Resistive Index.        Normal cortical thickness of the left kidney. 1-59% stenosis        of the left renal artery. LRV flow present. Cyst(s) noted.        Avascular cystic mass noted in the upper pole, measuring 11.9        x 8.8 x 12.6 cm. Mesenteric: Normal Celiac artery and Superior Mesenteric artery findings.  Patent IVC.  *See table(s) above for measurements and observations.  Diagnosing physician: Jenkins Rouge MD  Electronically signed by Jenkins Rouge MD on 11/07/2021 at 2:57:58 PM.    Final     regadenoson Carlton Adam) injection SOLN 0.4 mg     Date Action Dose Route User   10/31/2021 0841 Given 0.4 mg Intravenous Hornowski Kubak, Elzbieta      technetium tetrofosmin (TC-MYOVIEW) injection [id number] millicurie     Date Action Dose Route User   10/31/2021 0842 Contrast Given [id number] millicurie Intravenous Hornowski Meta Hatchet, [place]  technetium tetrofosmin (TC-MYOVIEW) injection [id number] millicurie     Date Action Dose Route User   11/07/2021 1211 Contrast Given [id number] millicurie Intravenous Hornowski Kubak, Elzbieta      tezepelumab-ekko (TEZSPIRE) 210 MG/1.91ML syringe 210 mg     Date Action Dose Route User   11/04/2021 1543 Given 210 mg Subcutaneous (Left Arm) Derl Barrow, CMA   10/07/2021 1735 Given 210 mg Subcutaneous (Right Arm) Derl Barrow, CMA       PFT Results Latest Ref Rng & Units 12/01/2019  FVC-Pre L 3.73  FVC-Predicted Pre % 77  FVC-Post L 3.90  FVC-Predicted Post % 81  Pre  FEV1/FVC % % 82  Post FEV1/FCV % % 82  FEV1-Pre L 3.07  FEV1-Predicted Pre % 82  FEV1-Post L 3.22  DLCO uncorrected ml/min/mmHg 26.25  DLCO UNC% % 94  DLVA Predicted % 105  TLC L 5.75  TLC % Predicted % 85  RV % Predicted % 84    No results found for: NITRICOXIDE      Assessment & Plan:   OSA (obstructive sleep apnea) Patient has very severe obstructive sleep apnea-Long discussion regarding potential complications of untreated sleep apnea.  Unfortunately patient declines BiPAP and does not have any interest in reevaluation or treatment options.    Morbid obesity ([place]) Continue on healthy weight loss Discussed referral to healthy weight and wellness patient declines at this time  Dyspnea Dyspnea with exertion-suspect this is multifactorial.  Patient does have a postinflammatory fibrosis, along with physical deconditioning, morbid obesity, worry for possible underlying pulmonary hypertension as he has untreated very severe sleep apnea.  He has no desaturations with ambulation today in the office. Asthma does appear to be under good control. For now we will check PFTs on return.  If significant decline consider a repeat high-res CT Agree with upcoming echo would definitely evaluate for possible pulmonary hypertension as he is at risk  Plan Patient Instructions  Work on healthy weight .  Continue on Trelegy 1 puff daily, rinse after use Albuterol inhaler or nebulizer as needed for wheezing. Continue follow up with allergist.  Activity as tolerated.  Follow up in 4 weeks Dr. 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  PFTs had showed no significant abnormalities in 2021.  With normal diffusing capacity. We will repeat PFTs if significant decline consider a repeat high-res CT  chest.   I spent  40  minutes dedicated to the care of this patient on the date of this encounter to include pre-visit review of records, face-to-face time with the patient discussing conditions above, post visit ordering of testing, clinical documentation with the electronic health record, making appropriate referrals as documented, and communicating necessary findings to members of the patients care team.   Rexene Edison, NP 11/28/2021

## 2021-11-28 NOTE — Assessment & Plan Note (Signed)
Continue on healthy weight loss ?Discussed referral to healthy weight and wellness patient declines at this time ?

## 2021-11-28 NOTE — Assessment & Plan Note (Signed)
Patient has very severe obstructive sleep apnea-Long discussion regarding potential complications of untreated sleep apnea.  Unfortunately patient declines BiPAP and does not have any interest in reevaluation or treatment options. ? ? ?

## 2021-11-28 NOTE — Patient Instructions (Signed)
Work on healthy weight .  ?Continue on Trelegy 1 puff daily, rinse after use ?Albuterol inhaler or nebulizer as needed for wheezing. ?Continue follow up with allergist.  ?Activity as tolerated.  ?Follow up in 4 weeks Dr. Vassie Loll with PFTs and As needed   ?Please contact office for sooner follow up if symptoms do not improve or worsen or seek emergency care  ? ?

## 2021-11-28 NOTE — Assessment & Plan Note (Signed)
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  PFTs had showed no significant abnormalities in 2021.  With normal diffusing capacity. ?We will repeat PFTs if significant decline consider a repeat high-res CT chest. ?

## 2021-12-01 ENCOUNTER — Other Ambulatory Visit: Payer: Self-pay | Admitting: Allergy and Immunology

## 2021-12-02 ENCOUNTER — Ambulatory Visit: Payer: 59

## 2021-12-02 ENCOUNTER — Ambulatory Visit: Payer: 59 | Admitting: Allergy and Immunology

## 2021-12-05 ENCOUNTER — Ambulatory Visit (HOSPITAL_COMMUNITY): Payer: 59 | Attending: Cardiology

## 2021-12-05 ENCOUNTER — Other Ambulatory Visit: Payer: Self-pay

## 2021-12-05 ENCOUNTER — Ambulatory Visit: Payer: 59 | Admitting: Pharmacist

## 2021-12-05 VITALS — BP 134/88 | HR 62 | Wt 338.0 lb

## 2021-12-05 DIAGNOSIS — I10 Essential (primary) hypertension: Secondary | ICD-10-CM

## 2021-12-05 DIAGNOSIS — I701 Atherosclerosis of renal artery: Secondary | ICD-10-CM | POA: Diagnosis not present

## 2021-12-05 DIAGNOSIS — R0609 Other forms of dyspnea: Secondary | ICD-10-CM | POA: Diagnosis present

## 2021-12-05 DIAGNOSIS — G4733 Obstructive sleep apnea (adult) (pediatric): Secondary | ICD-10-CM | POA: Insufficient documentation

## 2021-12-05 DIAGNOSIS — I77810 Thoracic aortic ectasia: Secondary | ICD-10-CM | POA: Insufficient documentation

## 2021-12-05 LAB — ECHOCARDIOGRAM COMPLETE
Area-P 1/2: 3.01 cm2
S' Lateral: 3.1 cm

## 2021-12-05 MED ORDER — AMLODIPINE BESYLATE 10 MG PO TABS
10.0000 mg | ORAL_TABLET | Freq: Every day | ORAL | 3 refills | Status: AC
Start: 2021-12-05 — End: ?

## 2021-12-05 NOTE — Patient Instructions (Addendum)
Please increase amlodipine to 10mg  daily. You can take 2 tablets of the 5mg  tablets to make 10mg .  ?Continue valsartan 320mg  daily, metoprolol succinate 50mg  daily, furosemide 40mg  daily ?Please check your blood pressure twice a day at home. Pick up a new cuff off the validated list. ? ?Your blood pressure goal is <130/80 ? ?To check your pressure at home you will need to: ? ?1. Sit up in a chair, with feet flat on the floor and back supported. Do not cross your ankles ?or legs. ?2. Rest your left arm so that the cuff is about heart level. If the cuff goes on your upper arm,  ?then just relax the arm on the table, arm of the chair or your lap. If you have a wrist cuff, we  ?suggest relaxing your wrist against your chest (think of it as Pledging the Flag with the  ?wrong arm).  ?3. Place the cuff snugly around your arm, about 1 inch above the crook of your elbow. The  ?cords should be inside the groove of your elbow.  ?4. Sit quietly, with the cuff in place, for about 5 minutes. After that 5 minutes press the power  ?button to start a reading. ?5. Do not talk or move while the reading is taking place.  ?6. Record your readings on a sheet of paper. Although most cuffs have a memory, it is often  ?easier to see a pattern developing when the numbers are all in front of you.  ?7. You can repeat the reading after 1-3 minutes if it is recommended ? ?Make sure your bladder is empty and you have not had caffeine or tobacco within the last 30 min ? ?Always bring your blood pressure log with you to your appointments. If you have not brought your monitor in to be double checked for accuracy, please bring it to your next appointment. ? ?You can find a list of validated (accurate) blood pressure cuffs at [email or web address] ? ? ?

## 2021-12-05 NOTE — Progress Notes (Signed)
Patient ID: Don [name]                 DOB: 1966-08-24                      MRN: [id number] ? ? ? ? ?HPI: ?Don [name] is a 56 y.o. male referred by Dr. Radford Pax to HTN clinic. PMH is significant for asthma, allergic rhinitis, untreated sleep apnea, shortness of breath depression and hypertension. 2D echo was obtained on 06/20/2021 showed normal LV function with EF 55 to 60% with mild LVH.  There was grade 1 diastolic dysfunction. He saw Dr. Radford Pax on 10/16/21 for SOB on exertion. Blood pressure was 146/103 HR 104. He was not using his BIPAP. Chlorthalidone 25mg  daily and metoprolol succinate 25mg  daily was started. His scr bumped from 1.34 to 1.51 with the addition of chlorthalidone. Chlorthalidone was stopped and metoprolol was increased to 50mg  daily by Dr. Radford Pax. Renin/aldosterone, metanephrines and catecholamines labs were normal. Renal ultra sound showed moderate bilateral renal artery stenosis <59%. He has severe sleep apnea but refuses BiPAP. ? ?At last visit with PharmD, blood pressure was close to goal and no changes were made. He was asked to check blood pressure at home. He saw Dr. Radford Pax on 2/17. She ordered a 24hr BP monitor. This has not been done yet. Home monitor was found to read high.  ?Patient presents to HTN clinic today for follow up. During med rec, it was discovered that patient has been taking amlodipine 5mg  daily. We were under the impression he was taking 10mg . The bottle says take 1 tablet by mouth daily (10mg  not available). Unclear if this was an error on the pharmacy part or a misunderstanding on our part. Patient has very mild dizziness when changing positions. SOB on exertion. Chronic LEE that is unchanged. Has not been checking BP since his cuff was inaccurate. Is being treated for a UTI.  ? ?Current HTN meds: valsartan 320mg  daily, amlodipine 5mg  daily, metoprolol succinate 50mg  daily, furosemide 40mg  daily ?Previously tried: HCTZ, chlorthalidone ?BP goal: <130/80 ? ?Family  History:  ?Family History  ?Problem Relation Age of Onset  ? Hypertension Mother   ? Breast cancer Mother   ? Diabetes Mother   ? Hypertension Father   ? Stroke Father   ? Allergic rhinitis Neg Hx   ? Angioedema Neg Hx   ? Asthma Neg Hx   ? Eczema Neg Hx   ? Immunodeficiency Neg Hx   ? Urticaria Neg Hx   ? Atopy Neg Hx   ? ? ?Social History: 2 beers on the weekend- use to drink more ?Social History  ? ?Socioeconomic History  ? Marital status: Married  ?  Spouse name: Not on file  ? Number of children: 3  ? Years of education: Not on file  ? Highest education level: Not on file  ?Occupational History  ? Occupation: Landscaping  ?  Employer: [employer]  ?Tobacco Use  ? Smoking status: Former  ?  Packs/day: 1.00  ?  Years: 6.00  ?  Pack years: 6.00  ?  Types: Cigarettes  ?  Quit date: 09/28/1990  ?  Years since quitting: 31.2  ? Smokeless tobacco: Never  ?Vaping Use  ? Vaping Use: Never used  ?Substance and Sexual Activity  ? Alcohol use: Yes  ?  Comment: SOCIAL   ? Drug use: No  ? Sexual activity: Not on file  ?Other Topics Concern  ? Not on file  ?  Social History Narrative  ? Not on file  ? ?Social Determinants of Health  ? ?Financial Resource Strain: Not on file  ?Food Insecurity: Not on file  ?Transportation Needs: Not on file  ?Physical Activity: Not on file  ?Stress: Not on file  ?Social Connections: Not on file  ?Intimate Partner Violence: Not on file  ? ? ?Diet: 2-3 cups of coffee per day ?Chicken, salmon ?Low carbs, no sugar ?Removes sugar from his coffee ?Eating more greens ? ?Exercise: gets SOB with minimal activity ? ?Home BP readings:  ? ?Wt Readings from Last 3 Encounters:  ?11/14/21 (!) 341 lb 12.8 oz (155 kg)  ?10/31/21 (!) 362 lb (164.2 kg)  ?10/16/21 (!) 362 lb (164.2 kg)  ? ?BP Readings from Last 3 Encounters:  ?11/28/21 114/66  ?11/07/21 124/84  ?10/16/21 (!) 146/102  ? ?Pulse Readings from Last 3 Encounters:  ?11/28/21 (!) 118  ?11/07/21 71  ?10/16/21 (!) 104  ? ? ?Renal function: ?CrCl cannot  be calculated (Unknown ideal weight.). ? ?Past Medical History:  ?Diagnosis Date  ? Asthma   ? Claustrophobia   ? Depression   ? Hypertension   ? Kidney stone   ? YRS AGO   PASSED ON OWN   ? OSA (obstructive sleep apnea)   ? Followed by Dr. Elsworth Soho  ? Renal artery stenosis ([place])   ? bilateral 1-59% by dopplers 10/2021  ? ? ?Current Outpatient Medications on File Prior to Visit  ?Medication Sig Dispense Refill  ? albuterol (PROVENTIL HFA) 108 (90 Base) MCG/ACT inhaler Inhale 2 puffs into the lungs every 4 (four) hours as needed for wheezing or shortness of breath. 6.7 each 1  ? amLODipine (NORVASC) 10 MG tablet Take 5 mg by mouth daily.    ? cetirizine (ZYRTEC) 10 MG tablet Take 1 tablet (10 mg total) by mouth daily. (Patient not taking: Reported on 11/28/2021) 30 tablet 5  ? fluticasone (FLONASE) 50 MCG/ACT nasal spray Place 2 sprays into both nostrils daily. (Patient not taking: Reported on 11/28/2021) 16 g 5  ? Fluticasone-Umeclidin-Vilant (TRELEGY ELLIPTA) 200-62.5-25 MCG/INH AEPB Inhale 1 puff into the lungs daily. 60 each 5  ? furosemide (LASIX) 40 MG tablet Take 40 mg by mouth daily.    ? metoprolol succinate (TOPROL-XL) 50 MG 24 hr tablet Take 1 tablet (50 mg total) by mouth daily. Take with or immediately following a meal. 90 tablet 3  ? montelukast (SINGULAIR) 10 MG tablet TAKE 1 TABLET BY MOUTH EVERYDAY AT BEDTIME 30 tablet 0  ? sertraline (ZOLOFT) 50 MG tablet Take 50 mg by mouth daily. 1 1/2 tabs 75 mg    ? sulfamethoxazole-trimethoprim (BACTRIM DS) 800-160 MG tablet Take 1 tablet by mouth 2 (two) times daily.    ? tamsulosin (FLOMAX) 0.4 MG CAPS capsule Take 0.4 mg by mouth daily.    ? valsartan (DIOVAN) 320 MG tablet Take 320 mg by mouth daily.     ? ?Current Facility-Administered Medications on File Prior to Visit  ?Medication Dose Route Frequency Provider Last Rate Last Admin  ? tezepelumab-ekko (TEZSPIRE) 210 MG/1.91ML syringe 210 mg  210 mg Subcutaneous Q28 days Althea Charon, FNP   210 mg at 11/04/21  1543  ? ? ?Allergies  ?Allergen Reactions  ? Lisinopril Other (See Comments)  ?  Muscle aches and fatigue.  ? ? ?There were no vitals taken for this visit. ? ? ?Assessment/Plan: ? ?1. Hypertension - Blood pressure is above goal of <130/80 in clinic today. No home readings available. BP  at pulmonologist was at goal. HR was high there. HR in the 60's today. BP mildly elevated. I have given the patient a list of validated BP cuff. I have asked him to purchase one of these and start checking blood pressure at home 2 readings twice a day. I reviewed proper technique. Bring in cuff and readings to next visit. Increase amlodipine to 10mg  daily. Follow up in 3.5 weeks (next available). If patient does not check BP at home or home readings are variable, will then do 24hr monitor. ?Patient does have bilateral renal artery stenosis. With <59% stenosis, will continue ARB. Renal function is stable, will continue to monitor.  ? ?Thank you ? ?Ramond Dial, Pharm.D, BCPS, CPP ?[id number] [place], [place] [address]  ?Phone: [phone]; Fax: [phone]  ? ? ?

## 2021-12-08 ENCOUNTER — Encounter: Payer: Self-pay | Admitting: Cardiology

## 2021-12-16 ENCOUNTER — Other Ambulatory Visit: Payer: Self-pay

## 2021-12-16 ENCOUNTER — Ambulatory Visit (INDEPENDENT_AMBULATORY_CARE_PROVIDER_SITE_OTHER): Payer: 59

## 2021-12-16 DIAGNOSIS — J4550 Severe persistent asthma, uncomplicated: Secondary | ICD-10-CM | POA: Diagnosis not present

## 2021-12-25 ENCOUNTER — Other Ambulatory Visit: Payer: Self-pay | Admitting: Pulmonary Disease

## 2021-12-25 DIAGNOSIS — J8410 Pulmonary fibrosis, unspecified: Secondary | ICD-10-CM

## 2021-12-26 ENCOUNTER — Telehealth: Payer: 59 | Admitting: Primary Care

## 2021-12-26 ENCOUNTER — Ambulatory Visit (INDEPENDENT_AMBULATORY_CARE_PROVIDER_SITE_OTHER): Payer: 59 | Admitting: Pulmonary Disease

## 2021-12-26 DIAGNOSIS — J8410 Pulmonary fibrosis, unspecified: Secondary | ICD-10-CM | POA: Diagnosis not present

## 2021-12-26 LAB — PULMONARY FUNCTION TEST
DL/VA % pred: 109 %
DL/VA: 4.81 ml/min/mmHg/L
DLCO cor % pred: 106 %
DLCO cor: 28.59 ml/min/mmHg
DLCO unc % pred: 106 %
DLCO unc: 28.59 ml/min/mmHg
FEF 25-75 Post: 4.73 L/sec
FEF 25-75 Pre: 3.13 L/sec
FEF2575-%Change-Post: 51 %
FEF2575-%Pred-Post: 153 %
FEF2575-%Pred-Pre: 101 %
FEV1-%Change-Post: 9 %
FEV1-%Pred-Post: 100 %
FEV1-%Pred-Pre: 91 %
FEV1-Post: 3.57 L
FEV1-Pre: 3.25 L
FEV1FVC-%Change-Post: 4 %
FEV1FVC-%Pred-Pre: 104 %
FEV6-%Change-Post: 4 %
FEV6-%Pred-Post: 95 %
FEV6-%Pred-Pre: 91 %
FEV6-Post: 4.23 L
FEV6-Pre: 4.03 L
FEV6FVC-%Change-Post: 0 %
FEV6FVC-%Pred-Post: 104 %
FEV6FVC-%Pred-Pre: 104 %
FVC-%Change-Post: 5 %
FVC-%Pred-Post: 91 %
FVC-%Pred-Pre: 87 %
FVC-Post: 4.24 L
FVC-Pre: 4.04 L
Post FEV1/FVC ratio: 84 %
Post FEV6/FVC ratio: 100 %
Pre FEV1/FVC ratio: 81 %
Pre FEV6/FVC Ratio: 100 %
RV % pred: 106 %
RV: 2.14 L
TLC % pred: 95 %
TLC: 6.3 L

## 2021-12-26 NOTE — Progress Notes (Signed)
PFT done today. 

## 2021-12-29 ENCOUNTER — Other Ambulatory Visit: Payer: Self-pay | Admitting: Allergy and Immunology

## 2021-12-30 ENCOUNTER — Ambulatory Visit: Payer: 59

## 2022-01-07 NOTE — Telephone Encounter (Signed)
Patient was seen by TP on 11/28/21. Will close encounter.  ?

## 2022-01-08 NOTE — Telephone Encounter (Signed)
Attempt to contact patient to schedule 24 HR AMB BP monitor.  No answer, no voicemail.  ? ?

## 2022-01-09 ENCOUNTER — Other Ambulatory Visit: Payer: Self-pay

## 2022-01-09 MED ORDER — METOPROLOL SUCCINATE ER 50 MG PO TB24: 50.0000 mg | ORAL_TABLET | Freq: Every day | ORAL | 3 refills | Status: DC

## 2022-01-15 ENCOUNTER — Ambulatory Visit (INDEPENDENT_AMBULATORY_CARE_PROVIDER_SITE_OTHER): Payer: 59

## 2022-01-15 DIAGNOSIS — J4550 Severe persistent asthma, uncomplicated: Secondary | ICD-10-CM | POA: Diagnosis not present

## 2022-01-21 ENCOUNTER — Other Ambulatory Visit: Payer: Self-pay | Admitting: *Deleted

## 2022-01-21 MED ORDER — ALBUTEROL SULFATE HFA 108 (90 BASE) MCG/ACT IN AERS: 2.0000 | INHALATION_SPRAY | RESPIRATORY_TRACT | 0 refills | Status: DC | PRN

## 2022-02-03 ENCOUNTER — Ambulatory Visit: Payer: 59 | Admitting: Cardiology

## 2022-02-03 ENCOUNTER — Other Ambulatory Visit: Payer: Self-pay | Admitting: Allergy and Immunology

## 2022-02-10 ENCOUNTER — Ambulatory Visit: Payer: 59 | Admitting: Cardiology

## 2022-02-12 ENCOUNTER — Ambulatory Visit (INDEPENDENT_AMBULATORY_CARE_PROVIDER_SITE_OTHER): Payer: 59

## 2022-02-12 DIAGNOSIS — J4550 Severe persistent asthma, uncomplicated: Secondary | ICD-10-CM | POA: Diagnosis not present

## 2022-03-12 ENCOUNTER — Ambulatory Visit (INDEPENDENT_AMBULATORY_CARE_PROVIDER_SITE_OTHER): Payer: 59

## 2022-03-12 DIAGNOSIS — J4550 Severe persistent asthma, uncomplicated: Secondary | ICD-10-CM

## 2022-04-09 ENCOUNTER — Ambulatory Visit: Payer: 59

## 2022-04-14 ENCOUNTER — Encounter: Payer: Self-pay | Admitting: Allergy and Immunology

## 2022-04-14 ENCOUNTER — Ambulatory Visit (INDEPENDENT_AMBULATORY_CARE_PROVIDER_SITE_OTHER): Payer: 59 | Admitting: Allergy and Immunology

## 2022-04-14 ENCOUNTER — Ambulatory Visit: Payer: 59

## 2022-04-14 VITALS — BP 136/88 | HR 80 | Temp 97.3°F | Resp 16 | Ht 67.32 in | Wt 357.0 lb

## 2022-04-14 DIAGNOSIS — J4550 Severe persistent asthma, uncomplicated: Secondary | ICD-10-CM | POA: Diagnosis not present

## 2022-04-14 DIAGNOSIS — L989 Disorder of the skin and subcutaneous tissue, unspecified: Secondary | ICD-10-CM | POA: Diagnosis not present

## 2022-04-14 DIAGNOSIS — G4733 Obstructive sleep apnea (adult) (pediatric): Secondary | ICD-10-CM

## 2022-04-14 DIAGNOSIS — J3089 Other allergic rhinitis: Secondary | ICD-10-CM | POA: Diagnosis not present

## 2022-04-14 MED ORDER — FLUTICASONE PROPIONATE 50 MCG/ACT NA SUSP: 2.0000 | Freq: Every day | NASAL | 1 refills | Status: DC

## 2022-04-14 MED ORDER — ALBUTEROL SULFATE HFA 108 (90 BASE) MCG/ACT IN AERS: 2.0000 | INHALATION_SPRAY | Freq: Four times a day (QID) | RESPIRATORY_TRACT | 1 refills | Status: DC | PRN

## 2022-04-14 MED ORDER — IPRATROPIUM-ALBUTEROL 0.5-2.5 (3) MG/3ML IN SOLN: 3.0000 mL | RESPIRATORY_TRACT | 1 refills | Status: DC | PRN

## 2022-04-14 MED ORDER — CETIRIZINE HCL 10 MG PO TABS: 10.0000 mg | ORAL_TABLET | Freq: Every day | ORAL | 1 refills | Status: DC | PRN

## 2022-04-14 MED ORDER — TRELEGY ELLIPTA 200-62.5-25 MCG/ACT IN AEPB: 1.0000 | INHALATION_SPRAY | Freq: Every morning | RESPIRATORY_TRACT | 1 refills | Status: DC

## 2022-04-14 MED ORDER — MONTELUKAST SODIUM 10 MG PO TABS: 10.0000 mg | ORAL_TABLET | Freq: Every evening | ORAL | 1 refills | Status: DC

## 2022-04-14 NOTE — Patient Instructions (Signed)
  1.  Continue Tezepelumab injection every 4 weeks   2.  Continue Trelegy 200 - 1 inhalation 1 time a day    3.  Continue montelukast 10 mg - 1 tablet 1 time per day.    4.  Continue Flonase - 2 sprays each nostril 1 time per day  5.  If needed:   A. Albuterol + ipratropium nebulization every 4-6 hours  B. Albuterol HFA -2 inhalations every 4-6 hours  C. Cetirizine 10 mg - 1 tablet 1 time per day  D. Desonide ointment applied 1-7 times per week to skin rash  6. Return to clinic in 6 months or earlier if problem  7. Obtain fall flu vaccine

## 2022-04-14 NOTE — Progress Notes (Signed)
[place] [facility] [place] [facility] [place] [facility] [place] [facility] [place]   Follow-up Note  Referring Provider: Alyson Ingles, [place]* Primary Provider: Tamala Ser Date of Office Visit: 04/14/2022  Subjective:   Don [name] (DOB: 1966/07/02) is a 56 y.o. male who [facility] in re-evaluation of the following:  HPI: Don [name] returns to this clinic in evaluation of asthma, allergic rhinitis, pruritic disorder, and untreated sleep apnea.  His last visit to this clinic was 19 August 2021.  Overall he is done very well with his asthma and has not required a systemic steroid to treat an exacerbation and rarely uses any short acting bronchodilator.  He continues to use tezepelumab injections as well as a triple inhaler.  He has had very little problems with his nose while intermittently using montelukast and Flonase.  He has not required an antibiotic to treat an episode of sinusitis.  He has not been having any problems with his pruritic disorder and does not use any topical steroids.  He does not treat his sleep apnea.  He did visit with cardiology regarding his dilated aorta and he will be monitored by cardiology for this issue.  He has been given a prescription of Wegovy for his morbid obesity but has yet to acquire this medication with anticipation of acquiring this medication in September 2023.  The plan is for him to lose enough weight so that he can have his right knee replaced.  Allergies as of 04/14/2022       Reactions   Lisinopril Itching   Muscle aches and fatigue.        Medication List    albuterol 108 (90 Base) MCG/ACT inhaler Commonly known as: VENTOLIN HFA USE 2 INHALATIONS BY MOUTH EVERY 4 HOURS AS NEEDED FOR WHEEZING  OR SHORTNESS OF BREATH   amLODipine 10 MG tablet Commonly known as: NORVASC Take 1 tablet (10 mg total) by mouth daily.   cetirizine 10 MG tablet Commonly known as: ZYRTEC Take 1 tablet (10  mg total) by mouth daily.   chlorthalidone 25 MG tablet Commonly known as: HYGROTON Take 1 tablet by mouth daily.   ciprofloxacin 500 MG tablet Commonly known as: CIPRO Take 1 tablet by mouth every 12 (twelve) hours.   ciprofloxacin 500 MG tablet Commonly known as: CIPRO 500 mg 2 (two) times daily.   fluticasone 50 MCG/ACT nasal spray Commonly known as: Flonase Place 2 sprays into both nostrils daily.   furosemide 40 MG tablet Commonly known as: LASIX Take 40 mg by mouth daily.   ipratropium-albuterol 0.5-2.5 (3) MG/3ML Soln Commonly known as: DUONEB TAKE 3 MLS BY NEBULIZATION EVERY 6 (SIX) HOURS AS NEEDED.   metoprolol succinate 50 MG 24 hr tablet Commonly known as: TOPROL-XL Take 1 tablet (50 mg total) by mouth daily. Take with or immediately following a meal.   montelukast 10 MG tablet Commonly known as: SINGULAIR TAKE 1 TABLET BY MOUTH EVERYDAY AT BEDTIME   sertraline 50 MG tablet Commonly known as: ZOLOFT Take 50 mg by mouth daily. 1 1/2 tabs 75 mg   tamsulosin 0.4 MG Caps capsule Commonly known as: FLOMAX Take 0.4 mg by mouth daily.   traMADol 50 MG tablet Commonly known as: ULTRAM Take 1 tablet by mouth every 6 (six) hours.   valsartan 320 MG tablet Commonly known as: DIOVAN Take 320 mg by mouth daily.   Wegovy 0.25 MG/0.5ML Soaj Generic drug: Semaglutide-Weight Management Inject into the skin.  Past Medical History:  Diagnosis Date   Acquired dilation of ascending aorta and aortic root (HCC)    38 mm for a sending and aortic root by echo 11/2020 and 38 mm aortic root at the sinuses of Valsalva and a sending aorta 40 mm by chest CTA 10/2021   Asthma    Claustrophobia    Depression    Hypertension    Kidney stone    YRS AGO   PASSED ON OWN    OSA (obstructive sleep apnea)    Followed by Dr. Vassie Loll   Renal artery stenosis [facility]    bilateral 1-59% by dopplers 10/2021    Past Surgical History:  Procedure Laterality Date   ANKLE SURGERY      RIGHT   LASIK     LUMBAR LAMINECTOMY/DECOMPRESSION MICRODISCECTOMY Right 02/28/2013   Procedure: LUMBAR LAMINECTOMY/DECOMPRESSION MICRODISCECTOMY 1 LEVEL;  Surgeon: Maeola Harman, MD;  Location: [facility];  Service: Neurosurgery;  Laterality: Right;  Right Lumbar Four-Five Laminectomy   MAXIMUM ACCESS (MAS)POSTERIOR LUMBAR INTERBODY FUSION (PLIF) 1 LEVEL N/A 04/23/2015   Procedure: L4-5 Maximum access posterior lumbar interbody fusion;  Surgeon: Maeola Harman, MD;  Location: [facility];  Service: Neurosurgery;  Laterality: N/A;  L4-5 Maximum access posterior lumbar interbody fusion   RADIOLOGY WITH ANESTHESIA N/A 02/05/2015   Procedure: MRI - Lumbar Spine;  Surgeon: Medication Radiologist, MD;  Location: [facility] OR;  Service: Radiology;  Laterality: N/A;   TONSILLECTOMY     AS CHILD      Review of systems negative except as noted in HPI / PMHx or noted below:  Review of Systems  Constitutional: Negative.   HENT: Negative.    Eyes: Negative.   Respiratory: Negative.    Cardiovascular: Negative.   Gastrointestinal: Negative.   Genitourinary: Negative.   Musculoskeletal: Negative.   Skin: Negative.   Neurological: Negative.   Endo/Heme/Allergies: Negative.   Psychiatric/Behavioral: Negative.       Objective:   Vitals:   04/14/22 1506  BP: 136/88  Pulse: 80  Resp: 16  Temp: (!) 97.3 F (36.3 C)  SpO2: 95%   Height: 5' 7.32" (171 cm)  Weight: (!) 357 lb (161.9 kg)   Physical Exam Constitutional:      Appearance: He is not diaphoretic.  HENT:     Head: Normocephalic.     Right Ear: Tympanic membrane, ear canal and external ear normal.     Left Ear: Tympanic membrane, ear canal and external ear normal.     Nose: Nose normal. No mucosal edema or rhinorrhea.     Mouth/Throat:     Pharynx: Uvula midline. No oropharyngeal exudate.  Eyes:     Conjunctiva/sclera: Conjunctivae normal.  Neck:     Thyroid: No thyromegaly.     Trachea: Trachea normal. No tracheal tenderness or  tracheal deviation.  Cardiovascular:     Rate and Rhythm: Normal rate and regular rhythm.     Heart sounds: Normal heart sounds, S1 normal and S2 normal. No murmur heard. Pulmonary:     Effort: No respiratory distress.     Breath sounds: Normal breath sounds. No stridor. No wheezing or rales.  Lymphadenopathy:     Head:     Right side of head: No tonsillar adenopathy.     Left side of head: No tonsillar adenopathy.     Cervical: No cervical adenopathy.  Skin:    Findings: No erythema or rash.     Nails: There is no clubbing.  Neurological:  Mental Status: He is alert.     Diagnostics:    Spirometry was performed and demonstrated an FEV1 of 2.95 at 87 % of predicted.  Assessment and Plan:   1. Asthma, severe persistent, well-controlled   2. Other allergic rhinitis   3. Inflammatory dermatosis   4. Obstructive sleep apnea syndrome     1.  Continue Tezepelumab injection every 4 weeks   2.  Continue Trelegy 200 - 1 inhalation 1 time a day    3.  Continue montelukast 10 mg - 1 tablet 1 time per day.    4.  Continue Flonase - 2 sprays each nostril 1 time per day  5.  If needed:   A. Albuterol + ipratropium nebulization every 4-6 hours  B. Albuterol HFA -2 inhalations every 4-6 hours  C. Cetirizine 10 mg - 1 tablet 1 time per day  D. Desonide ointment applied 1-7 times per week to skin rash  6. Return to clinic in 6 months or earlier if problem  7. Obtain fall flu vaccine  Overall Shailen is doing relatively well on his current plan of action which includes an anti-TSL P antibody and a triple inhaler and a some occasional leukotriene modifier nasal steroid.  He will continue to use these plan and assuming he does well I will see him back in this clinic in 6 months or earlier if there is a problem.  Allena Katz, MD Allergy / Immunology [place]

## 2022-04-15 ENCOUNTER — Encounter: Payer: Self-pay | Admitting: Allergy and Immunology

## 2022-04-24 ENCOUNTER — Other Ambulatory Visit: Payer: Self-pay | Admitting: *Deleted

## 2022-04-24 MED ORDER — ALBUTEROL SULFATE HFA 108 (90 BASE) MCG/ACT IN AERS: INHALATION_SPRAY | RESPIRATORY_TRACT | 1 refills | Status: DC

## 2022-05-12 ENCOUNTER — Ambulatory Visit (INDEPENDENT_AMBULATORY_CARE_PROVIDER_SITE_OTHER): Payer: 59 | Admitting: *Deleted

## 2022-05-12 DIAGNOSIS — J4550 Severe persistent asthma, uncomplicated: Secondary | ICD-10-CM

## 2022-06-11 ENCOUNTER — Ambulatory Visit (INDEPENDENT_AMBULATORY_CARE_PROVIDER_SITE_OTHER): Payer: 59 | Admitting: *Deleted

## 2022-06-11 DIAGNOSIS — J4550 Severe persistent asthma, uncomplicated: Secondary | ICD-10-CM

## 2022-06-29 ENCOUNTER — Other Ambulatory Visit: Payer: Self-pay | Admitting: Allergy and Immunology

## 2022-06-29 ENCOUNTER — Other Ambulatory Visit: Payer: Self-pay | Admitting: *Deleted

## 2022-06-29 MED ORDER — TEZSPIRE 210 MG/1.91ML ~~LOC~~ SOSY
210.0000 mg | PREFILLED_SYRINGE | SUBCUTANEOUS | 11 refills | Status: AC
Start: 2022-06-29 — End: ?

## 2022-07-01 ENCOUNTER — Ambulatory Visit
Admission: EM | Admit: 2022-07-01 | Discharge: 2022-07-01 | Disposition: A | Discharge status: Home / Self Care | Payer: 59 | Attending: Physician Assistant | Admitting: Physician Assistant

## 2022-07-01 ENCOUNTER — Other Ambulatory Visit: Payer: Self-pay | Admitting: Allergy and Immunology

## 2022-07-01 DIAGNOSIS — H60502 Unspecified acute noninfective otitis externa, left ear: Secondary | ICD-10-CM

## 2022-07-01 MED ORDER — CIPROFLOXACIN-DEXAMETHASONE 0.3-0.1 % OT SUSP: 4.0000 [drp] | Freq: Two times a day (BID) | OTIC | 0 refills | Status: AC

## 2022-07-01 NOTE — ED Triage Notes (Signed)
Pt c/o left ear pain that is radiating forward towards jaw causing pain when chewing. Onset ~ Saturday

## 2022-07-01 NOTE — ED Provider Notes (Signed)
[facility]    CSN: [id number] Arrival date & time: 07/01/22  0935      History   Chief Complaint Chief Complaint  Patient presents with   Otalgia    HPI Don [name] is a 56 y.o. male.   Patient here today for evaluation of left-sided ear pain that started about 4 days ago.  He reports symptoms have gradually worsened.  He has had drainage from his ear.  He has not had any fever but has felt chills at times.  He notes that pain is now radiating into his left jaw.  He does admit to swimming recently while he was traveling in the [place].  He has a cough at baseline.  He has tried taking over-the-counter medication including over-the-counter eardrops without significant relief.  The history is provided by the patient.  Otalgia Associated symptoms: cough   Associated symptoms: no congestion, no diarrhea, no fever and no vomiting     Past Medical History:  Diagnosis Date   Acquired dilation of ascending aorta and aortic root (HCC)    38 mm for a sending and aortic root by echo 11/2020 and 38 mm aortic root at the sinuses of Valsalva and a sending aorta 40 mm by chest CTA 10/2021   Asthma    Claustrophobia    Depression    Hypertension    Kidney stone    YRS AGO   PASSED ON OWN    OSA (obstructive sleep apnea)    Followed by Dr. 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   Renal artery stenosis ([place]) 11/07/2021   Chronic respiratory failure with hypoxia ([place]) 09/18/2019   OSA (obstructive sleep apnea) 08/04/2019   Achilles tendinitis of right lower extremity 07/28/2019   Asthma 10/20/2018   Leukocytosis 10/09/2016   Essential hypertension 09/25/2016   Major depression 09/25/2016   Morbid obesity ([place]) 09/25/2016   Pulmonary nodule 09/25/2016   Herniated lumbar disc without myelopathy  04/23/2015    Past Surgical History:  Procedure Laterality Date   ANKLE SURGERY     RIGHT   LASIK     LUMBAR LAMINECTOMY/DECOMPRESSION MICRODISCECTOMY Right 02/28/2013   Procedure: LUMBAR LAMINECTOMY/DECOMPRESSION MICRODISCECTOMY 1 LEVEL;  Surgeon: Erline Levine, MD;  Location: [facility];  Service: Neurosurgery;  Laterality: Right;  Right Lumbar Four-Five Laminectomy   MAXIMUM ACCESS (MAS)POSTERIOR LUMBAR INTERBODY FUSION (PLIF) 1 LEVEL N/A 04/23/2015   Procedure: L4-5 Maximum access posterior lumbar interbody fusion;  Surgeon: Erline Levine, MD;  Location: [facility];  Service: Neurosurgery;  Laterality: N/A;  L4-5 Maximum access posterior lumbar interbody fusion   RADIOLOGY WITH ANESTHESIA N/A 02/05/2015   Procedure: MRI - Lumbar Spine;  Surgeon: Medication Radiologist, MD;  Location: [employer] [facility];  Service: Radiology;  Laterality: N/A;   TONSILLECTOMY     AS CHILD         Home Medications    Prior to Admission medications   Medication Sig Start Date End Date Taking? Authorizing Provider  ciprofloxacin-dexamethasone (CIPRODEX) OTIC suspension Place 4 drops into the left ear 2 (two) times daily for 7 days. 07/01/22 07/08/22 Yes Francene Finders, PA-C  albuterol (PROVENTIL HFA) 108 (90 Base) MCG/ACT inhaler Inhale two puffs every 4-6 hours if needed for cough or wheeze. 04/24/22   Kozlow, Donnamarie Poag, MD  amLODipine (NORVASC) 10 MG  tablet Take 1 tablet (10 mg total) by mouth daily. 12/05/21   Quintella Reichert, MD  cetirizine (ZYRTEC) 10 MG tablet Take 1 tablet (10 mg total) by mouth daily as needed for allergies (Can take an extra doe during flare ups.). 04/14/22   Kozlow, Alvira Philips, MD  chlorthalidone (HYGROTON) 25 MG tablet Take 1 tablet by mouth daily.    [provider]  ciprofloxacin (CIPRO) 500 MG tablet 500 mg 2 (two) times daily. 11/29/21   [provider]  ciprofloxacin (CIPRO) 500 MG tablet Take 1 tablet by mouth every 12 (twelve) hours.    [provider]  fluticasone  (FLONASE) 50 MCG/ACT nasal spray Place 2 sprays into both nostrils daily. 04/14/22   Kozlow, Alvira Philips, MD  Fluticasone-Umeclidin-Vilant (TRELEGY ELLIPTA) 200-62.5-25 MCG/ACT AEPB Inhale 1 puff into the lungs in the morning. 04/14/22   Kozlow, Alvira Philips, MD  furosemide (LASIX) 40 MG tablet Take 40 mg by mouth daily. 11/12/21   [provider]  ipratropium-albuterol (DUONEB) 0.5-2.5 (3) MG/3ML SOLN Take 3 mLs by nebulization every 4 (four) hours as needed. 04/14/22   Kozlow, Alvira Philips, MD  metoprolol succinate (TOPROL-XL) 50 MG 24 hr tablet Take 1 tablet (50 mg total) by mouth daily. Take with or immediately following a meal. 01/09/22   Turner, Cornelious Bryant, MD  montelukast (SINGULAIR) 10 MG tablet Take 1 tablet (10 mg total) by mouth at bedtime. 04/14/22   Kozlow, Alvira Philips, MD  sertraline (ZOLOFT) 50 MG tablet Take 50 mg by mouth daily. 1 1/2 tabs 75 mg 05/29/19   [provider]  tamsulosin (FLOMAX) 0.4 MG CAPS capsule Take 0.4 mg by mouth daily. 10/05/21   [provider]  tezepelumab-ekko (TEZSPIRE) 210 MG/1. syringe Inject 1.91 mLs (210 mg total) into the skin every 28 (twenty-eight) days. 06/29/22   Kozlow, Alvira Philips, MD  TEZSPIRE 210 MG/1. syringe INJECT 210MG  (1 SYRINGE)  SUBCUTANEOUSLY EVERY 4  WEEKS 06/29/22   Kozlow, 08/29/22, MD  traMADol (ULTRAM) 50 MG tablet Take 1 tablet by mouth every 6 (six) hours.    [provider]  valsartan (DIOVAN) 320 MG tablet Take 320 mg by mouth daily.     [provider]  WEGOVY 0.25 MG/0.5ML SOAJ Inject into the skin. 04/07/22   [provider]    Family History Family History  Problem Relation Age of Onset   Hypertension Mother    Breast cancer Mother    Diabetes Mother    Hypertension Father    Stroke Father    Allergic rhinitis Neg Hx    Angioedema Neg Hx    Asthma Neg Hx    Eczema Neg Hx    Immunodeficiency Neg Hx    Urticaria Neg Hx    Atopy Neg Hx     Social History Social History   Tobacco Use    Smoking status: Former    Packs/day: 1.00    Years: 6.00    Total pack years: 6.00    Types: Cigarettes    Quit date: 09/28/1990    Years since quitting: 31.7   Smokeless tobacco: Never  Vaping Use   Vaping Use: Never used  Substance Use Topics   Alcohol use: Yes    Comment: SOCIAL    Drug use: No     Allergies   Lisinopril   Review of Systems Review of Systems  Constitutional:  Positive for chills. Negative for fever.  HENT:  Positive for ear pain. Negative for congestion.  Eyes:  Negative for discharge and redness.  Respiratory:  Positive for cough. Negative for shortness of breath.   Gastrointestinal:  Negative for diarrhea, nausea and vomiting.     Physical Exam Triage Vital Signs ED Triage Vitals [07/01/22 1034]  Enc Vitals Group     BP (!) 160/99     Pulse Rate 80     Resp 18     Temp 98 F (36.7 C)     Temp Source Oral     SpO2 95 %     Weight      Height      Head Circumference      Peak Flow      Pain Score 6     Pain Loc      Pain Edu?      Excl. in GC?    No data found.  Updated Vital Signs BP (!) 160/99 (BP Location: Right Arm)   Pulse 80   Temp 98 F (36.7 C) (Oral)   Resp 18   SpO2 95%     Physical Exam Vitals and nursing note reviewed.  Constitutional:      General: He is not in acute distress.    Appearance: Normal appearance. He is not ill-appearing.  HENT:     Head: Normocephalic and atraumatic.     Right Ear: Tympanic membrane and ear canal normal.     Ears:     Comments: Unable to visualize left TM due to significant inflammation and tenderness to left EAC, white exudate noted to left EAC, tragal movement tenderness noted Cardiovascular:     Rate and Rhythm: Normal rate.  Pulmonary:     Effort: Pulmonary effort is normal. No respiratory distress.  Neurological:     Mental Status: He is alert.  Psychiatric:        Mood and Affect: Mood normal.        Behavior: Behavior normal.      UC Treatments / Results   Labs (all labs ordered are listed, but only abnormal results are displayed) Labs Reviewed - No data to display  EKG   Radiology No results found.  Procedures Procedures (including critical care time)  Medications Ordered in UC Medications - No data to display  Initial Impression / Assessment and Plan / UC Course  I have reviewed the triage vital signs and the nursing notes.  Pertinent labs & imaging results that were available during my care of the patient were reviewed by me and considered in my medical decision making (see chart for details).    Ciprodex prescribed to cover otitis externa. Recommended ibuprofen if needed for pain. Encouraged follow up with any further concerns or persistent symptoms.   Final Clinical Impressions(s) / UC Diagnoses   Final diagnoses:  Acute otitis externa of left ear, unspecified type   Discharge Instructions   None    ED Prescriptions     Medication Sig Dispense Auth. Provider   ciprofloxacin-dexamethasone (CIPRODEX) OTIC suspension Place 4 drops into the left ear 2 (two) times daily for 7 days. 7.5 mL Tomi Bamberger, PA-C      PDMP not reviewed this encounter.   Tomi Bamberger, PA-C 07/01/22 1109

## 2022-07-09 ENCOUNTER — Ambulatory Visit (INDEPENDENT_AMBULATORY_CARE_PROVIDER_SITE_OTHER): Payer: 59

## 2022-07-09 DIAGNOSIS — J4550 Severe persistent asthma, uncomplicated: Secondary | ICD-10-CM

## 2022-07-25 ENCOUNTER — Other Ambulatory Visit: Payer: Self-pay | Admitting: Allergy and Immunology

## 2022-08-06 ENCOUNTER — Ambulatory Visit (INDEPENDENT_AMBULATORY_CARE_PROVIDER_SITE_OTHER): Payer: 59 | Admitting: *Deleted

## 2022-08-06 DIAGNOSIS — J4550 Severe persistent asthma, uncomplicated: Secondary | ICD-10-CM

## 2022-08-25 ENCOUNTER — Telehealth: Payer: Self-pay | Admitting: Allergy and Immunology

## 2022-08-25 NOTE — Telephone Encounter (Signed)
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 Patient is requesting a letter for work stating all the allergies and that he has asthma. Patient is also requesting for letter to state that he is on medication for his allergies and asthma.   Please advise.   Best contact number: [phone]

## 2022-08-26 NOTE — Telephone Encounter (Signed)
Called and spoke with patient and gathered more information as to what the letter needs to say. Letter has been typed and sent to patient electronically through MyChart for him to take to work tomorrow. Patient is aware and verbalized understanding.

## 2022-09-03 ENCOUNTER — Ambulatory Visit (INDEPENDENT_AMBULATORY_CARE_PROVIDER_SITE_OTHER): Payer: 59

## 2022-09-03 DIAGNOSIS — J4550 Severe persistent asthma, uncomplicated: Secondary | ICD-10-CM

## 2022-10-01 ENCOUNTER — Ambulatory Visit (INDEPENDENT_AMBULATORY_CARE_PROVIDER_SITE_OTHER): Payer: 59

## 2022-10-01 DIAGNOSIS — J4550 Severe persistent asthma, uncomplicated: Secondary | ICD-10-CM | POA: Diagnosis not present

## 2022-10-13 ENCOUNTER — Ambulatory Visit: Payer: 59 | Admitting: Allergy and Immunology

## 2022-10-28 ENCOUNTER — Other Ambulatory Visit: Payer: Self-pay | Admitting: Allergy and Immunology

## 2022-10-29 ENCOUNTER — Ambulatory Visit (INDEPENDENT_AMBULATORY_CARE_PROVIDER_SITE_OTHER): Payer: 59

## 2022-10-29 DIAGNOSIS — J4550 Severe persistent asthma, uncomplicated: Secondary | ICD-10-CM

## 2022-11-08 ENCOUNTER — Other Ambulatory Visit: Payer: Self-pay | Admitting: Cardiology

## 2022-11-11 ENCOUNTER — Other Ambulatory Visit: Payer: Self-pay

## 2022-11-11 MED ORDER — METOPROLOL SUCCINATE ER 50 MG PO TB24: 50.0000 mg | ORAL_TABLET | Freq: Every day | ORAL | 0 refills | Status: DC

## 2022-11-11 MED ORDER — METOPROLOL SUCCINATE ER 50 MG PO TB24: 50.0000 mg | ORAL_TABLET | Freq: Every day | ORAL | 3 refills | Status: DC

## 2022-11-20 ENCOUNTER — Other Ambulatory Visit: Payer: Self-pay | Admitting: Cardiology

## 2022-11-23 NOTE — Patient Instructions (Incomplete)
  1.  Continue Tezepelumab injection every 4 weeks  We will also get some lab work to see if he qualifies for another biologic. Previously on [place] and [place]. He thinks that he has been on Nucala.  2.  Continue Trelegy 200 - 1 inhalation 1 time a day    3.  Start montelukast 10 mg - 1 tablet 1 time per day.  This can help with your asthma also. Patient cautioned that rarely some children/adults can experience behavioral changes after beginning montelukast. These side effects are rare, however, if you notice any change, notify the clinic and discontinue montelukast.   4.  Continue Flonase - 2 sprays each nostril 1 time per day. In the right nostril, point the applicator out toward the right ear. In the left nostril, point the applicator out toward the left ear  5.  If needed:   A. Albuterol + ipratropium nebulization every 4-6 hours  B. Albuterol HFA -2 inhalations every 4-6 hours  C. Cetirizine 10 mg - 1 tablet 1 time per day  D. Desonide ointment applied 1-7 times per week to skin rash  6. Return to clinic in 4-6 weeks with Dr. Neldon Mc or earlier if problem  7. Obtain fall flu vaccine  Recommend trying to lose weight  Recommend following up with cardiology due shortness of breath with exertion

## 2022-11-24 ENCOUNTER — Ambulatory Visit: Payer: 59

## 2022-11-24 ENCOUNTER — Other Ambulatory Visit: Payer: Self-pay

## 2022-11-24 ENCOUNTER — Encounter: Payer: Self-pay | Admitting: Family

## 2022-11-24 ENCOUNTER — Ambulatory Visit: Payer: 59 | Admitting: Family

## 2022-11-24 VITALS — BP 136/88 | HR 87 | Temp 98.2°F | Resp 18

## 2022-11-24 DIAGNOSIS — G4733 Obstructive sleep apnea (adult) (pediatric): Secondary | ICD-10-CM | POA: Diagnosis not present

## 2022-11-24 DIAGNOSIS — J4550 Severe persistent asthma, uncomplicated: Secondary | ICD-10-CM | POA: Diagnosis not present

## 2022-11-24 DIAGNOSIS — J3089 Other allergic rhinitis: Secondary | ICD-10-CM

## 2022-11-24 DIAGNOSIS — D729 Disorder of white blood cells, unspecified: Secondary | ICD-10-CM

## 2022-11-24 MED ORDER — IPRATROPIUM-ALBUTEROL 0.5-2.5 (3) MG/3ML IN SOLN: 3.0000 mL | RESPIRATORY_TRACT | 1 refills | Status: AC | PRN

## 2022-11-24 MED ORDER — ALBUTEROL SULFATE HFA 108 (90 BASE) MCG/ACT IN AERS: INHALATION_SPRAY | RESPIRATORY_TRACT | 1 refills | Status: DC

## 2022-11-24 MED ORDER — CETIRIZINE HCL 10 MG PO TABS: 10.0000 mg | ORAL_TABLET | Freq: Every day | ORAL | 1 refills | Status: DC | PRN

## 2022-11-24 MED ORDER — TRELEGY ELLIPTA 200-62.5-25 MCG/ACT IN AEPB: INHALATION_SPRAY | RESPIRATORY_TRACT | 5 refills | Status: DC

## 2022-11-24 MED ORDER — MONTELUKAST SODIUM 10 MG PO TABS: 10.0000 mg | ORAL_TABLET | Freq: Every evening | ORAL | 1 refills | Status: DC

## 2022-11-24 MED ORDER — FLUTICASONE PROPIONATE 50 MCG/ACT NA SUSP: 2.0000 | Freq: Every day | NASAL | 1 refills | Status: DC

## 2022-11-24 NOTE — Progress Notes (Signed)
Don [name] [id number] Dept: [phone]  FOLLOW UP NOTE  Patient ID: Don [name], male    DOB: [birth year]  Age: 57 y.o. MRN: [id number] Date of Office Visit: 11/24/2022  Assessment  Chief Complaint: Follow-up  HPI Don [name] 57 year old male who presents today for follow-up of well-controlled severe persistent asthma, allergic rhinitis, inflammatory dermatosis, and obstructive sleep apnea syndrome.  He was last seen on April 14, 2022 by Dr. Neldon Mc.  He denies any new diagnosis or surgery since his last office visit.  He mentions that he needs to lose weight to have bilateral knee replacement surgery.  Severe persistent asthma: He is currently taking Trelegy 200 mcg 1 puff once a day, Tezspire injections every 4 weeks, montelukast 10 mg only when sneezing, albuterol as needed, and albuterol plus ipratropium nebulization as needed.  He reports coughing or wheezing depending on what activity he does.  The symptoms do not occur every day.  His cough can be productive with mainly white with a little bit of yellow.  He also reports shortness of breath with exertion.  He denies tightness in his chest and nocturnal awakenings due to breathing problems.  He thinks around November or December he went to urgent care and was given a steroid for cough.  He is also had steroid injections for his knees 2 to 3 weeks ago.  He uses his albuterol inhaler approximately a couple times a week and his albuterol plus ipratropium nebulizer twice a week.  He is wondering if there is another inhaler he could try using. Discussed that he is already on triple therapy for his asthma and that there was not another type of inhaler that would be considered a step up.  He is not certain if  Cheron Every is helpful since it has been so long.  After reviewing Epic it was found that he has previously been on Dupixent, [place] for his asthma.  He is willing to try taking Singulair 10 mg once a day to see if  this helps with his asthma.  Allergic rhinitis: He reports rhinorrhea and nasal congestion depending on what he is doing.  He also has postnasal drip once in a while.  He has not had any sinus infections since we last saw him.  He takes cetirizine as needed, Flonase as needed, and montelukast 10 mg when he is sneezing.  Inflammatory dermatosis: He reports that he has not had any issues with his skin recently and has not had to use desonide ointment.  Obstructive sleep apnea syndrome: He is currently not using a BiPAP or CPAP.  He does not wish to use a BiPAP or CPAP machine due to learning that it can cause cancer.  He is no longer taking Wegovy for his morbid obesity due to gaining 10 pounds while he was on this medication.  Discussed that his shortness of breath with exertion could possibly be due to deconditioning.  He has not seen cardiology since his last office visit due to his dilated aorta.   Drug Allergies:  Allergies  Allergen Reactions   Lisinopril Itching    Muscle aches and fatigue.    Review of Systems: Review of Systems  Constitutional:  Negative for chills and fever.  HENT:         Reports rhinorrhea and nasal congestion depending on what he does. He also reports post nasal drip once in a while  Eyes:        Denies itchy watery  eyes  Respiratory:  Positive for cough, shortness of breath and wheezing.        Reports cough and wheeze depending on what activity he does. Also, reports shortness of breath with exertion. Denies tightness in chest and nocturnal awakenings due to breathing problems  Cardiovascular:  Negative for chest pain and palpitations.  Gastrointestinal:        He cannot remember the last time he had reflux  Skin:  Negative for itching and rash.  Neurological:  Negative for headaches.  Endo/Heme/Allergies:  Positive for environmental allergies.     Physical Exam: BP 136/88 (BP Location: Right Arm, Patient Position: Sitting, Cuff Size: Normal)    Pulse 87   Temp 98.2 F (36.8 C) (Temporal)   Resp 18   SpO2 94%    Physical Exam Constitutional:      Appearance: Normal appearance.  HENT:     Head: Normocephalic and atraumatic.     Comments: Pharynx normal.eyes normal. Ears normal. Nose: mildly edematous with no drainage    Right Ear: Tympanic membrane, ear canal and external ear normal.     Left Ear: Tympanic membrane, ear canal and external ear normal.     Mouth/Throat:     Mouth: Mucous membranes are moist.     Pharynx: Oropharynx is clear.  Eyes:     Conjunctiva/sclera: Conjunctivae normal.  Cardiovascular:     Rate and Rhythm: Regular rhythm.     Heart sounds: Normal heart sounds.  Pulmonary:     Effort: Pulmonary effort is normal.     Breath sounds: Normal breath sounds.     Comments: Lungs clear to ascultation Musculoskeletal:     Cervical back: Neck supple.  Skin:    General: Skin is warm.  Neurological:     Mental Status: He is alert and oriented to person, place, and time.  Psychiatric:        Mood and Affect: Mood normal.        Behavior: Behavior normal.        Thought Content: Thought content normal.        Judgment: Judgment normal.     Diagnostics: FVC 3.74 L (86%), FEV1 2.95 L (86%).  Spirometry indicates normal spirometry.  Assessment and Plan: 1. Not well controlled severe persistent asthma   2. Other allergic rhinitis   3. Obstructive sleep apnea syndrome     Meds ordered this encounter  Medications   albuterol (VENTOLIN HFA) 108 (90 Base) MCG/ACT inhaler    Sig: USE 2 INHALATIONS BY MOUTH EVERY 4 TO 6 HOURS IF NEEDED FOR COUGH OR WHEEZE    Dispense:  54 g    Refill:  1   cetirizine (ZYRTEC) 10 MG tablet    Sig: Take 1 tablet (10 mg total) by mouth daily as needed for allergies (Can take an extra doe during flare ups.).    Dispense:  90 tablet    Refill:  1   fluticasone (FLONASE) 50 MCG/ACT nasal spray    Sig: Place 2 sprays into both nostrils daily.    Dispense:  48 g    Refill:   1   Fluticasone-Umeclidin-Vilant (TRELEGY ELLIPTA) 200-62.5-25 MCG/ACT AEPB    Sig: TAKE 1 PUFF BY MOUTH EVERY DAY    Dispense:  180 each    Refill:  5   ipratropium-albuterol (DUONEB) 0.5-2.5 (3) MG/3ML SOLN    Sig: Take 3 mLs by nebulization every 4 (four) hours as needed.    Dispense:  360 mL  Refill:  1   montelukast (SINGULAIR) 10 MG tablet    Sig: Take 1 tablet (10 mg total) by mouth at bedtime.    Dispense:  90 tablet    Refill:  1    Patient Instructions   1.  Continue Tezepelumab injection every 4 weeks  We will also get some lab work to see if he qualifies for another biologic. Previously on [place] and [place]. He thinks that he has been on Nucala.  2.  Continue Trelegy 200 - 1 inhalation 1 time a day    3.  Start montelukast 10 mg - 1 tablet 1 time per day.  This can help with your asthma also. Patient cautioned that rarely some children/adults can experience behavioral changes after beginning montelukast. These side effects are rare, however, if you notice any change, notify the clinic and discontinue montelukast.   4.  Continue Flonase - 2 sprays each nostril 1 time per day. In the right nostril, point the applicator out toward the right ear. In the left nostril, point the applicator out toward the left ear  5.  If needed:   A. Albuterol + ipratropium nebulization every 4-6 hours  B. Albuterol HFA -2 inhalations every 4-6 hours  C. Cetirizine 10 mg - 1 tablet 1 time per day  D. Desonide ointment applied 1-7 times per week to skin rash  6. Return to clinic in 4-6 weeks with Dr. Neldon Mc or earlier if problem  7. Obtain fall flu vaccine  Recommend trying to lose weight  Recommend following up with cardiology due shortness of breath with exertion  Return in about 6 weeks (around 01/05/2023), or if symptoms worsen or fail to improve.    Thank you for the opportunity to care for this patient.  Please do not hesitate to contact me with questions.  Althea Charon,  FNP [facility]

## 2022-11-25 NOTE — Addendum Note (Signed)
Addended by: Chip Boer R on: 11/25/2022 01:08 PM   Modules accepted: Orders

## 2022-11-27 LAB — ALLERGENS W/TOTAL IGE AREA 2
Alternaria Alternata IgE: 0.3 kU/L — AB
Aspergillus Fumigatus IgE: <0.1 kU/L
Bermuda Grass IgE: <0.1 kU/L
Cat Dander IgE: <0.1 kU/L
Cedar, Mountain IgE: <0.1 kU/L
Cladosporium Herbarum IgE: <0.1 kU/L
Cockroach, German IgE: <0.1 kU/L
Common Silver Birch IgE: <0.1 kU/L
Cottonwood IgE: <0.1 kU/L
D Farinae IgE: <0.1 kU/L
D Pteronyssinus IgE: <0.1 kU/L
Dog Dander IgE: <0.1 kU/L
Elm, American IgE: <0.1 kU/L
IgE (Immunoglobulin E), Serum: 82 IU/mL (ref 6–495)
Johnson Grass IgE: <0.1 kU/L
Maple/Box Elder IgE: <0.1 kU/L
Mouse Urine IgE: <0.1 kU/L
Oak, White IgE: <0.1 kU/L
Pecan, Hickory IgE: <0.1 kU/L
Penicillium Chrysogen IgE: <0.1 kU/L
Pigweed, Rough IgE: <0.1 kU/L
Ragweed, Short IgE: <0.1 kU/L
Sheep Sorrel IgE Qn: <0.1 kU/L
Timothy Grass IgE: <0.1 kU/L
White Mulberry IgE: <0.1 kU/L

## 2022-11-27 LAB — CBC WITH DIFFERENTIAL
Basophils Absolute: 0.1 10*3/uL (ref 0.0–0.2)
Basos: 1 %
EOS (ABSOLUTE): 0.7 10*3/uL — ABNORMAL HIGH (ref 0.0–0.4)
Eos: 5 %
Hematocrit: 48.7 % (ref 37.5–51.0)
Hemoglobin: 16.1 g/dL (ref 13.0–17.7)
Immature Grans (Abs): 0 10*3/uL (ref 0.0–0.1)
Immature Granulocytes: 0 %
Lymphocytes Absolute: 2.3 10*3/uL (ref 0.7–3.1)
Lymphs: 19 %
MCH: 27.6 pg (ref 26.6–33.0)
MCHC: 33.1 g/dL (ref 31.5–35.7)
MCV: 84 fL (ref 79–97)
Monocytes Absolute: 0.9 10*3/uL (ref 0.1–0.9)
Monocytes: 8 %
Neutrophils Absolute: 8.1 10*3/uL — ABNORMAL HIGH (ref 1.4–7.0)
Neutrophils: 67 %
RBC: 5.83 x10E6/uL — ABNORMAL HIGH (ref 4.14–5.80)
RDW: 13.7 % (ref 11.6–15.4)
WBC: 12.2 10*3/uL — ABNORMAL HIGH (ref 3.4–10.8)

## 2022-12-08 NOTE — Progress Notes (Signed)
Please let Don [name] know that I reviewed his lab results with Dr. Neldon Mc.  He continues to have elevated white blood cells, red blood cells and neutrophils. There is additional lab work that we need to get. (Lab order placed) If he could please come by the office and get this drawn. If this lab work is positive we will refer him to hematology. He can also get this drawn when he sees Dr. Neldon Mc on 12/22/22 @ 4 PM.   His lab work also shows elevated eosinophils.  His lab work for environmental allergies was positive to one mold: alternaria  Please send a copy of his lab results to his primary care physician

## 2022-12-08 NOTE — Addendum Note (Signed)
Addended by: Althea Charon on: 12/08/2022 01:34 PM   Modules accepted: Orders

## 2022-12-17 ENCOUNTER — Other Ambulatory Visit: Payer: Self-pay | Admitting: *Deleted

## 2022-12-17 MED ORDER — TRELEGY ELLIPTA 200-62.5-25 MCG/ACT IN AEPB: INHALATION_SPRAY | RESPIRATORY_TRACT | 5 refills | Status: DC

## 2022-12-22 ENCOUNTER — Ambulatory Visit: Payer: 59

## 2022-12-22 ENCOUNTER — Ambulatory Visit: Payer: 59 | Admitting: Allergy and Immunology

## 2022-12-22 ENCOUNTER — Encounter: Payer: Self-pay | Admitting: Allergy and Immunology

## 2022-12-22 ENCOUNTER — Other Ambulatory Visit: Payer: Self-pay

## 2022-12-22 VITALS — BP 106/82 | HR 82 | Temp 98.2°F

## 2022-12-22 DIAGNOSIS — G4733 Obstructive sleep apnea (adult) (pediatric): Secondary | ICD-10-CM

## 2022-12-22 DIAGNOSIS — J3089 Other allergic rhinitis: Secondary | ICD-10-CM

## 2022-12-22 DIAGNOSIS — L989 Disorder of the skin and subcutaneous tissue, unspecified: Secondary | ICD-10-CM

## 2022-12-22 DIAGNOSIS — J4550 Severe persistent asthma, uncomplicated: Secondary | ICD-10-CM

## 2022-12-22 NOTE — Progress Notes (Unsigned)
[facility]   Follow-up Note  Referring Provider: Traci Sermon, [place]* Primary Provider: Elpidio Heman Que Date of Office Visit: 12/22/2022  Subjective:   Don [name] (DOB: 18-Mar-1966) is a 57 y.o. male who [facility]/2022 in re-evaluation of the following:  HPI: Don [name] returns to this clinic in evaluation of asthma, allergic rhinitis, pruritic disorder, untreated sleep apnea, and dyspnea on exertion.  His last visit to this clinic was 14 April 2022.  He still has very significant dyspnea on exertion and is completely out of breath if he does minimal amounts of activity.  He did visit with our nurse practitioner regarding this issue on 24 November 2022 who introduced the use of montelukast which has not really changed anything.  He remains on a triple inhaler and he remains on tezepelumab injections.  His nose is doing pretty well while using Flonase.  When he uses the short acting bronchodilator it does not help him at all.  He has not really been having much problems with his skin he is not using any topical agents.  His sleep apnea remains untreated.  Allergies as of 12/22/2022       Reactions   Lisinopril Itching   Muscle aches and fatigue.        Medication List    albuterol 108 (90 Base) MCG/ACT inhaler Commonly known as: VENTOLIN HFA USE 2 INHALATIONS BY MOUTH EVERY 4 TO 6 HOURS IF NEEDED FOR COUGH OR WHEEZE   amLODipine 10 MG tablet Commonly known as: NORVASC Take 1 tablet (10 mg total) by mouth daily.   cetirizine 10 MG tablet Commonly known as: ZYRTEC Take 1 tablet (10 mg total) by mouth daily as needed for allergies (Can take an extra doe during flare ups.).   chlorthalidone 25 MG tablet Commonly known as: HYGROTON Take 1 tablet by mouth daily.   ciprofloxacin 500 MG tablet Commonly known as: CIPRO Take 1 tablet by mouth every 12 (twelve) hours.    ciprofloxacin 500 MG tablet Commonly known as: CIPRO 500 mg 2 (two) times daily.   fluticasone 50 MCG/ACT nasal spray Commonly known as: Flonase Place 2 sprays into both nostrils daily.   furosemide 40 MG tablet Commonly known as: LASIX Take 40 mg by mouth daily.   ipratropium-albuterol 0.5-2.5 (3) MG/3ML Soln Commonly known as: DUONEB Take 3 mLs by nebulization every 4 (four) hours as needed.   Meloxicam 15 MG Tbdp Take 1 tablet by mouth daily.   metoprolol succinate 50 MG 24 hr tablet Commonly known as: TOPROL-XL Take 1 tablet (50 mg total) by mouth daily. Take with or immediately following a meal.   montelukast 10 MG tablet Commonly known as: SINGULAIR Take 1 tablet (10 mg total) by mouth at bedtime.   sertraline 50 MG tablet Commonly known as: ZOLOFT Take 50 mg by mouth daily. 1 1/2 tabs 75 mg   tamsulosin 0.4 MG Caps capsule Commonly known as: FLOMAX Take 0.4 mg by mouth daily.   Tezspire 210 MG/1.91ML syringe Generic drug: tezepelumab-ekko INJECT 210MG  (1 SYRINGE)  SUBCUTANEOUSLY EVERY 4  WEEKS   Tezspire 210 MG/1.91ML syringe Generic drug: tezepelumab-ekko Inject 1.91 mLs (210 mg total) into the skin every 28 (twenty-eight) days.   traMADol 50 MG tablet Commonly known as: ULTRAM Take 1 tablet by mouth every 6 (six) hours.   Trelegy Ellipta 200-62.5-25 MCG/ACT Aepb Generic drug: Fluticasone-Umeclidin-Vilant TAKE 1 PUFF BY MOUTH EVERY  DAY   valsartan 320 MG tablet Commonly known as: DIOVAN Take 320 mg by mouth daily.    Past Medical History:  Diagnosis Date   Acquired dilation of ascending aorta and aortic root (HCC)    38 mm for a sending and aortic root by echo 11/2020 and 38 mm aortic root at the sinuses of Valsalva and a sending aorta 40 mm by chest CTA 10/2021   Asthma    Claustrophobia    Depression    Hypertension    Kidney stone    YRS AGO   PASSED ON OWN    OSA (obstructive sleep apnea)    Followed by Dr. Elsworth Soho   Renal artery stenosis  [facility])    bilateral 1-59% by dopplers 10/2021    Past Surgical History:  Procedure Laterality Date   ANKLE SURGERY     RIGHT   LASIK     LUMBAR LAMINECTOMY/DECOMPRESSION MICRODISCECTOMY Right 02/28/2013   Procedure: LUMBAR LAMINECTOMY/DECOMPRESSION MICRODISCECTOMY 1 LEVEL;  Surgeon: Erline Levine, MD;  Location: [place] [facility];  Service: Neurosurgery;  Laterality: Right;  Right Lumbar Four-Five Laminectomy   MAXIMUM ACCESS (MAS)POSTERIOR LUMBAR INTERBODY FUSION (PLIF) 1 LEVEL N/A 04/23/2015   Procedure: L4-5 Maximum access posterior lumbar interbody fusion;  Surgeon: Erline Levine, MD;  Location: [place] [facility];  Service: Neurosurgery;  Laterality: N/A;  L4-5 Maximum access posterior lumbar interbody fusion   RADIOLOGY WITH ANESTHESIA N/A 02/05/2015   Procedure: MRI - Lumbar Spine;  Surgeon: Medication Radiologist, MD;  Location: [place];  Service: Radiology;  Laterality: N/A;   TONSILLECTOMY     AS CHILD      Review of systems negative except as noted in HPI / PMHx or noted below:  Review of Systems  Constitutional: Negative.   HENT: Negative.    Eyes: Negative.   Respiratory: Negative.    Cardiovascular: Negative.   Gastrointestinal: Negative.   Genitourinary: Negative.   Musculoskeletal: Negative.   Skin: Negative.   Neurological: Negative.   Endo/Heme/Allergies: Negative.   Psychiatric/Behavioral: Negative.       Objective:   Vitals:   12/22/22 1602  BP: 106/82  Pulse: 82  Temp: 98.2 F (36.8 C)  SpO2: 95%          Physical Exam Constitutional:      Appearance: He is not diaphoretic.  HENT:     Head: Normocephalic.     Right Ear: Tympanic membrane, ear canal and external ear normal.     Left Ear: Tympanic membrane, ear canal and external ear normal.     Nose: Nose normal. No mucosal edema or rhinorrhea.     Mouth/Throat:     Pharynx: Uvula midline. No oropharyngeal exudate.  Eyes:     Conjunctiva/sclera: Conjunctivae normal.  Neck:     Thyroid: No thyromegaly.      Trachea: Trachea normal. No tracheal tenderness or tracheal deviation.  Cardiovascular:     Rate and Rhythm: Normal rate and regular rhythm.     Heart sounds: Normal heart sounds, S1 normal and S2 normal. No murmur heard. Pulmonary:     Effort: No respiratory distress.     Breath sounds: Normal breath sounds. No stridor. No wheezing or rales.  Lymphadenopathy:     Head:     Right side of head: No tonsillar adenopathy.     Left side of head: No tonsillar adenopathy.     Cervical: No cervical adenopathy.  Skin:    Findings: No erythema or rash.     Nails:  There is no clubbing.  Neurological:     Mental Status: He is alert.     Diagnostics:    Spirometry was performed and demonstrated an FEV1 of 3.04 at 90 % of predicted.  The patient had an Asthma Control Test with the following results: ACT Total Score: 14.    Assessment and Plan:   1. Severe persistent asthma without complication     Patient Instructions   1.  Continue Tezepelumab injection every 4 weeks   2.  Continue Trelegy 200 - 1 inhalation 1 time a day    3.  Continue montelukast 10 mg - 1 tablet 1 time per day.     4.  Continue Flonase - 2 sprays each nostril 1 time per day.    5.  If needed:   A. Albuterol + ipratropium nebulization every 4-6 hours  B. Albuterol HFA -2 inhalations every 4-6 hours  C. Cetirizine 10 mg - 1 tablet 1 time per day  D. Desonide ointment applied 1-7 times per week to skin rash  6. Change from Tezepelumab to another biologic??? Will check lab results.   Allena Katz, MD Allergy / Immunology [facility]

## 2022-12-22 NOTE — Patient Instructions (Signed)
  1.  Continue Tezepelumab injection every 4 weeks   2.  Continue Trelegy 200 - 1 inhalation 1 time a day    3.  Continue montelukast 10 mg - 1 tablet 1 time per day.     4.  Continue Flonase - 2 sprays each nostril 1 time per day.    5.  If needed:   A. Albuterol + ipratropium nebulization every 4-6 hours  B. Albuterol HFA -2 inhalations every 4-6 hours  C. Cetirizine 10 mg - 1 tablet 1 time per day  D. Desonide ointment applied 1-7 times per week to skin rash  6. Change from Tezepelumab to another biologic??? Will check lab results.

## 2022-12-23 ENCOUNTER — Telehealth: Payer: Self-pay | Admitting: *Deleted

## 2022-12-23 ENCOUNTER — Encounter: Payer: Self-pay | Admitting: Allergy and Immunology

## 2022-12-23 LAB — CML CHROMOSOME/FISH PROFILE
Cells Analyzed: 0
Cells Analyzed: 200
Cells Counted: 0
Cells Counted: 200
Cells Karyotyped: 0

## 2022-12-23 NOTE — Telephone Encounter (Signed)
Tried to reach patient but unable to leave message voicemail not set up

## 2022-12-23 NOTE — Telephone Encounter (Signed)
-----   Message from Jiles Prows, MD sent at 12/23/2022  9:01 AM EDT ----- Has been treated with dupilumab, benralizumab, mepolizumab, tezepelumab without any improvement. We need to focus on weight loss. He has a HgA1C of >6 in the past. Lets start ozempic.

## 2022-12-24 NOTE — Progress Notes (Signed)
Please let Ronalee Belts know that his lab work that we did due to the elevated neutrophils was normal.This is good. Please send a copy to his primary care physician.

## 2022-12-30 ENCOUNTER — Telehealth: Payer: Self-pay

## 2022-12-30 MED ORDER — SEMAGLUTIDE(0.25 OR 0.5MG/DOS) 2 MG/3ML ~~LOC~~ SOPN: 0.2500 mg | PEN_INJECTOR | SUBCUTANEOUS | 1 refills | Status: AC

## 2022-12-30 NOTE — Telephone Encounter (Signed)
Called patient and advised instructions for weight loss. Ozempic rx sent in for patient to start on. He advised he did try Wegovy in past for 3 months and d/c by MD since he did not lose weight

## 2022-12-30 NOTE — Telephone Encounter (Signed)
Tried to call patient. Voicemail box not set up and could not leave a message.

## 2022-12-30 NOTE — Telephone Encounter (Signed)
-----   Message from Jiles Prows, MD sent at 12/23/2022  6:34 AM EDT ----- Please inform Ronalee Belts that I looked over his blood tests and we can try Dupilumab for his asthma to replace tezepelumab. He will need to RTC in summer 2024.

## 2022-12-31 ENCOUNTER — Telehealth: Payer: Self-pay

## 2022-12-31 NOTE — Telephone Encounter (Signed)
PA request received via CMM for Ozempic (0.25 or 0.5 MG/DOSE) 2MG /3ML pen-injectors  PA has been submitted via [employer] and is pending determination  Key: [id number]

## 2023-01-01 ENCOUNTER — Other Ambulatory Visit: Payer: Self-pay | Admitting: Allergy and Immunology

## 2023-01-01 NOTE — Telephone Encounter (Signed)
PA is pending.

## 2023-01-05 NOTE — Telephone Encounter (Signed)
PA has been DENIED. Medication is only covered for patients with type 2 diabetes.

## 2023-01-11 NOTE — Telephone Encounter (Signed)
Diabetes was not specified on the PA submitted due to no distinction on what type patient may/may not have.

## 2023-01-19 ENCOUNTER — Ambulatory Visit (INDEPENDENT_AMBULATORY_CARE_PROVIDER_SITE_OTHER): Payer: 59

## 2023-01-19 DIAGNOSIS — J4550 Severe persistent asthma, uncomplicated: Secondary | ICD-10-CM | POA: Diagnosis not present

## 2023-01-29 ENCOUNTER — Ambulatory Visit: Payer: 59 | Admitting: Podiatry

## 2023-01-29 ENCOUNTER — Encounter: Payer: Self-pay | Admitting: Podiatry

## 2023-01-29 ENCOUNTER — Ambulatory Visit (INDEPENDENT_AMBULATORY_CARE_PROVIDER_SITE_OTHER): Payer: 59

## 2023-01-29 DIAGNOSIS — M76822 Posterior tibial tendinitis, left leg: Secondary | ICD-10-CM | POA: Diagnosis not present

## 2023-01-29 DIAGNOSIS — M25572 Pain in left ankle and joints of left foot: Secondary | ICD-10-CM

## 2023-01-29 MED ORDER — MELOXICAM 15 MG PO TABS: 15.0000 mg | ORAL_TABLET | Freq: Every day | ORAL | 0 refills | Status: AC

## 2023-01-29 NOTE — Progress Notes (Signed)
  Subjective:  Patient ID: Don [name], male    DOB: Jan 20, 1966,   MRN: [id number]  Chief Complaint  Patient presents with   Ankle Pain    Left ankle pain on on going for awhile associated with swelling     57 y.o. male presents for concern of left ankle pain that has been going on for months. Relates swelling and pain with waking. 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 Has tried gout medication and tylenol with no relief . Denies any other pedal complaints. Denies n/v/f/c.   Past Medical History:  Diagnosis Date   Acquired dilation of ascending aorta and aortic root (HCC)    38 mm for a sending and aortic root by echo 11/2020 and 38 mm aortic root at the sinuses of Valsalva and a sending aorta 40 mm by chest CTA 10/2021   Asthma    Claustrophobia    Depression    Hypertension    Kidney stone    YRS AGO   PASSED ON OWN    OSA (obstructive sleep apnea)    Followed by Dr. Vassie Loll   Renal artery stenosis [facility])    bilateral 1-59% by dopplers 10/2021    Objective:  Physical Exam: Vascular: DP/PT pulses 2/4 bilateral. CFT <3 seconds. Normal hair growth on digits. No edema.  Skin. No lacerations or abrasions bilateral feet.  Musculoskeletal: MMT 5/5 bilateral lower extremities in DF, PF, Inversion and Eversion. Deceased ROM in DF of ankle joint. Tender along PT tendon proximal to insertion and along the course posterior to the medial malleolus. Tender with inversion of the foot and unable to perform single limb heel rise.  Neurological: Sensation intact to light touch.   Assessment:   1. Posterior tibial tendon dysfunction (PTTD) of left lower extremity      Plan:  Patient was evaluated and treated and all questions answered. X-rays reviewed and discussed with patient. No acute fractures or dislocation noted. Some posterior calcaneus spurring noted.  Reviewed previous provided notes and treatments.  Discussed PTTD diagnosis and treatment options with  patient. Stretching exercises discussed and handout dispensed. Prescription for meloxicam provided. Tri-Lock ankle brace dispensed. Discussed if there is no improvement PT/MRI/injection may be an option. Patient to return to clinic in 6 to 8 weeks or sooner if symptoms fail to improve or worsen.   Louann Sjogren, DPM

## 2023-01-29 NOTE — Patient Instructions (Signed)
Posterior Tibial Tendinitis Rehab Ask your health care provider which exercises are safe for you. Do exercises exactly as told by your health care provider and adjust them as directed. It is normal to feel mild stretching, pulling, tightness, or discomfort as you do these exercises. Stop right away if you feel sudden pain or your pain gets worse. Do not begin these exercises until told by your health care provider. Stretching and range-of-motion exercises These exercises warm up your muscles and joints and improve the movement and flexibility in your ankle and foot. These exercises may also help to relieve pain. Standing wall calf stretch, knee straight  Stand with your hands against a wall. Extend your left / right leg behind you, and bend your front knee slightly. If directed, place a folded washcloth under the arch of your foot for support. Point the toes of your back foot slightly inward. Keeping your heels on the floor and your back knee straight, shift your weight toward the wall. Do not allow your back to arch. You should feel a gentle stretch in your upper left / right calf. Hold this position for __________ seconds. Repeat __________ times. Complete this exercise __________ times a day. Standing wall calf stretch, knee bent Stand with your hands against a wall. Extend your left / right leg behind you, and bend your front knee slightly. If directed, place a folded washcloth under the arch of your foot for support. Point the toes of your back foot slightly inward. Unlock your back knee so it is bent. Keep your heels on the floor. You should feel a gentle stretch deep in your lower left / right calf. Hold this position for __________ seconds. Repeat __________ times. Complete this exercise __________ times a day. Strengthening exercises These exercises build strength and endurance in your ankle and foot. Endurance is the ability to use your muscles for a long time, even after they get  tired. Ankle inversion with band Secure one end of a rubber exercise band or tubing to a fixed object, such as a table leg or a pole, that will stay still when the band is pulled. Loop the other end of the band around the middle of your left / right foot. Sit on the floor facing the object with your left / right leg extended. The band or tube should be slightly tense when your foot is relaxed. Leading with your big toe, slowly bring your left / right foot and ankle inward, toward your other foot (inversion). Hold this position for __________ seconds. Slowly return your foot to the starting position. Repeat __________ times. Complete this exercise __________ times a day. Towel curls  Sit in a chair on a non-carpeted surface, and put your feet on the floor. Place a towel in front of your feet. If told by your health care provider, add a __________ weight to the end of the towel. Keeping your heel on the floor, put your left / right foot on the towel. Pull the towel toward you by grabbing the towel with your toes and curling them under. Keep your heel on the floor while you do this. Let your toes relax. Grab the towel with your toes again. Keep going until the towel is completely underneath your foot. Repeat __________ times. Complete this exercise __________ times a day. Balance exercise This exercise improves or maintains your balance. Balance is important in preventing falls. Single leg stand Without wearing shoes, stand near a railing or in a doorway. You may hold   on to the railing or door frame as needed for balance. Stand on your left / right foot. Keep your big toe down on the floor and try to keep your arch lifted. If balancing in this position is too easy, try the exercise with your eyes closed or while standing on a pillow. Hold this position for __________ seconds. Repeat __________ times. Complete this exercise __________ times a day. This information is not intended to replace  advice given to you by your health care provider. Make sure you discuss any questions you have with your health care provider. Document Revised: 01/10/2019 Document Reviewed: 11/16/2018 Elsevier Patient Education  2023 [employer]  

## 2023-02-03 ENCOUNTER — Ambulatory Visit: Payer: 59 | Admitting: Podiatry

## 2023-02-04 ENCOUNTER — Ambulatory Visit: Payer: 59 | Admitting: Podiatry

## 2023-02-04 DIAGNOSIS — M76822 Posterior tibial tendinitis, left leg: Secondary | ICD-10-CM

## 2023-02-04 MED ORDER — TRIAMCINOLONE ACETONIDE 10 MG/ML IJ SUSP
10.0000 mg | Freq: Once | INTRAMUSCULAR | Status: AC
  Administered 2023-02-04: 10 mg

## 2023-02-04 NOTE — Telephone Encounter (Signed)
Spoke with Casimiro Needle to see if he has an endocrinologist that could possibly help out with getting medication approved. Patient stated that he did not and that he has seen his PCP regarding this matter. He stated that he was just approved for a medication that started with a Z but could not remember the name of it. I called and spoke with his PCP office and was informed that he is probably talking about Zepbound however they would send a message to the provider for clarification and have someone call me back.

## 2023-02-04 NOTE — Progress Notes (Signed)
Subjective:   Patient ID: Don [name], male   DOB: 57 y.o.   MRN: [id number]   HPI Patient presents stating he has pain in the inside of his left ankle and is getting ready to go to [place]   ROS      Objective:  Physical Exam  Neuro vascular status intact with a lot of inflammation on the medial ankle left with fluid buildup around the posterior tibial tendon as it comes under medial malleolus with obesity is complicating factor     Assessment:  Posterior tibial tendinitis left with inflammation     Plan:  Sterile prep reviewed and went ahead today injected the sheath left 3 mg Dexasone Kenalog 5 mg Xylocaine advised on support shoes patient will be seen back as needed may require immobilization or other treatment but he is leaving tomorrow for daily.  Reappoint when he gets back

## 2023-03-09 ENCOUNTER — Ambulatory Visit (INDEPENDENT_AMBULATORY_CARE_PROVIDER_SITE_OTHER): Payer: 59

## 2023-03-09 DIAGNOSIS — J4550 Severe persistent asthma, uncomplicated: Secondary | ICD-10-CM

## 2023-03-12 ENCOUNTER — Ambulatory Visit: Payer: 59 | Admitting: Podiatry

## 2023-04-08 ENCOUNTER — Ambulatory Visit (INDEPENDENT_AMBULATORY_CARE_PROVIDER_SITE_OTHER): Payer: 59 | Admitting: *Deleted

## 2023-04-08 DIAGNOSIS — J4550 Severe persistent asthma, uncomplicated: Secondary | ICD-10-CM | POA: Diagnosis not present

## 2023-05-06 ENCOUNTER — Ambulatory Visit: Payer: 59

## 2023-06-22 ENCOUNTER — Ambulatory Visit (INDEPENDENT_AMBULATORY_CARE_PROVIDER_SITE_OTHER): Payer: 59

## 2023-06-22 DIAGNOSIS — J4550 Severe persistent asthma, uncomplicated: Secondary | ICD-10-CM

## 2023-07-01 ENCOUNTER — Other Ambulatory Visit: Payer: Self-pay | Admitting: Allergy and Immunology

## 2023-07-04 ENCOUNTER — Other Ambulatory Visit: Payer: Self-pay | Admitting: Allergy and Immunology

## 2023-07-20 ENCOUNTER — Ambulatory Visit: Payer: 59

## 2023-07-20 DIAGNOSIS — J4550 Severe persistent asthma, uncomplicated: Secondary | ICD-10-CM

## 2023-08-09 ENCOUNTER — Ambulatory Visit (INDEPENDENT_AMBULATORY_CARE_PROVIDER_SITE_OTHER): Payer: 59

## 2023-08-09 ENCOUNTER — Ambulatory Visit: Payer: 59 | Admitting: Podiatry

## 2023-08-09 ENCOUNTER — Encounter: Payer: Self-pay | Admitting: Podiatry

## 2023-08-09 VITALS — Ht 68.0 in | Wt 357.0 lb

## 2023-08-09 DIAGNOSIS — M7751 Other enthesopathy of right foot: Secondary | ICD-10-CM

## 2023-08-09 MED ORDER — TRIAMCINOLONE ACETONIDE 10 MG/ML IJ SUSP
10.0000 mg | Freq: Once | INTRAMUSCULAR | Status: AC
  Administered 2023-08-09: 10 mg via INTRA_ARTICULAR

## 2023-08-09 NOTE — Progress Notes (Signed)
Subjective:   Patient ID: Don [name], male   DOB: 57 y.o.   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   ROS      Objective:  Physical Exam  Neurovascular status intact inflammation fluid which is built around the second metatarsal phalangeal joint right painful when pressed     Assessment:  Inflammatory capsulitis right second MPJ fluid buildup noted     Plan:  H&P reviewed went ahead today discussed x-rays did sterile prep injected periarticular around the second MPJ right 3 mg dexamethasone Kenalog 5 mg Xylocaine may eventually require shortening osteotomy but organ to hold off and see how this does  X-rays indicate that there is no signs of fracture there is some shortening arthritis around the first MPJ where previous surgery was done but that joint functions excellent with excellent range of motion

## 2023-08-17 ENCOUNTER — Ambulatory Visit: Payer: 59

## 2023-08-17 DIAGNOSIS — J4550 Severe persistent asthma, uncomplicated: Secondary | ICD-10-CM | POA: Diagnosis not present

## 2023-08-23 ENCOUNTER — Ambulatory Visit: Payer: 59 | Admitting: Podiatry

## 2023-09-14 ENCOUNTER — Ambulatory Visit: Payer: 59 | Admitting: *Deleted

## 2023-09-14 DIAGNOSIS — J4550 Severe persistent asthma, uncomplicated: Secondary | ICD-10-CM | POA: Diagnosis not present

## 2023-10-04 ENCOUNTER — Ambulatory Visit: Payer: 59 | Admitting: Family Medicine

## 2023-10-04 ENCOUNTER — Other Ambulatory Visit: Payer: Self-pay | Admitting: Family Medicine

## 2023-10-04 ENCOUNTER — Encounter: Payer: Self-pay | Admitting: Family Medicine

## 2023-10-04 VITALS — BP 150/80 | HR 93 | Temp 97.6°F | Resp 12 | Wt 345.4 lb

## 2023-10-04 DIAGNOSIS — G4733 Obstructive sleep apnea (adult) (pediatric): Secondary | ICD-10-CM

## 2023-10-04 DIAGNOSIS — J4551 Severe persistent asthma with (acute) exacerbation: Secondary | ICD-10-CM

## 2023-10-04 DIAGNOSIS — J3089 Other allergic rhinitis: Secondary | ICD-10-CM | POA: Diagnosis not present

## 2023-10-04 MED ORDER — PREDNISONE 10 MG PO TABS
ORAL_TABLET | ORAL | 0 refills | Status: DC
Start: 2023-10-04 — End: 2023-10-04

## 2023-10-04 MED ORDER — FAMOTIDINE 20 MG PO TABS: 20.0000 mg | ORAL_TABLET | Freq: Two times a day (BID) | ORAL | 1 refills | Status: DC

## 2023-10-04 MED ORDER — LEVALBUTEROL TARTRATE 45 MCG/ACT IN AERO
4.0000 | INHALATION_SPRAY | Freq: Once | RESPIRATORY_TRACT | Status: AC
  Administered 2023-10-04: 4 via RESPIRATORY_TRACT

## 2023-10-04 MED ORDER — ARNUITY ELLIPTA 100 MCG/ACT IN AEPB: INHALATION_SPRAY | RESPIRATORY_TRACT | 1 refills | Status: AC

## 2023-10-04 NOTE — Progress Notes (Signed)
 [address] [place] [address] Dept: [phone]  FOLLOW UP NOTE  Patient ID: Don [name], male    DOB: 05/06/1966  Age: 58 y.o. MRN: [id number] Date of Office Visit: 10/04/2023  Assessment  Chief Complaint: Cough (Started Friday. Worse when he lays down. Productive with white to yellow coloring. Mucinex not helping. )  HPI Don [name] is a 58 year old male who presents to the clinic for evaluation of acute respiratory symptoms.  He was last seen in this clinic on 12/22/2022 by Dr. Kozlow for evaluation of asthma on tezspire , allergic rhinitis, and atopic dermatitis.  At that time, he was started on Ozempic  for weight loss. Chart review indicates history of smoking, ARDS secondary to COVID in 2020, renal stenosis, obesity, and obstructive sleep apnea for which he does not wear CPAP.   At today's visit, he reports that he began to experience cough with mucus ranging in color from clear to light yellow that began on Friday.  He reports that he did not take his temperature, however, he reports that he has experienced some sweats at nighttime over the last several days.  He reports several people are sick in his department at work.  He reports shortness of breath when lying down, wheezing with activity and rest, and coughing producing mucus ranging in color from clear to yellow.  He continues Trelegy 200-1 puff once a day, montelukast  10 mg once a day, and has used DuoNeb nebulizer treatment 3 times a day over the last several days with no relief of symptoms.  He continues to receive Tezspire  injections once every 4 weeks with no large or local reactions.  He reports a significant decrease in his symptoms of asthma while continuing on Tezspire  injections.  Of note, he has previously tried other biologic therapy including dupilumab , benralizumab , and mepolizumab  without relief of symptoms.  Allergic rhinitis is reported as moderately well-controlled with occasional rhinorrhea, nasal congestion  especially when lying down, sneezing, and postnasal drainage.  He began using Flonase  several days ago as well as Mucinex with no improvement in symptoms.  He is not currently taking an antihistamine or using a nasal saline rinse.  He has obstructive sleep apnea and does not wear a CPAP at night.  He denies reflux symptoms including heartburn or vomiting and is not taking a medication to control reflux. He denies chest pain or radiating pain. He reports daily swelling in bilateral lower extremities.   His current medications are listed in the chart.   Of note, chest xray was recommended due to workplace illness, cough and nighttime diaphoresis. Patient reports that he is not interested in getting a chest xray at this time and will call if his symptoms worsen or do not improve. Patient instructed to call the clinic with any worsening symptoms and go to the ED for respiratory distress.   CT ANGIOGRAPHY CHEST WITH CONTRAST 10/31/2021 TECHNIQUE: Multidetector CT imaging of the chest was performed using the standard protocol during bolus administration of intravenous contrast. Multiplanar CT image reconstructions and MIPs were obtained to evaluate the vascular anatomy.   RADIATION DOSE REDUCTION: This exam was performed according to the departmental dose-optimization program which includes automated exposure control, adjustment of the mA and/or kV according to patient size and/or use of iterative reconstruction technique. CONTRAST:  OMNIPAQUE  IOHEXOL  350 MG/ML SOLN COMPARISON:  CT chest, 12/11/2019.  Chest XR, 11/03/2019.  FINDINGS: CARDIOVASCULAR: Limitations by motion: Moderate Preferential opacification of the thoracic aorta. No evidence of thoracic aortic aneurysm or  dissection.   Aortic Root: --Valve: 2.5 cm --Sinuses: 3.8 cm  --Sinotubular Junction: 3.5 cm  Thoracic Aorta:  --Ascending Aorta: 4.0 cm --Aortic Arch: 3.5 cm --Descending Aorta: 0.7 cm  Other: Conventional  3-sided LEFT aortic arch. No atherosclerosis or hemodynamic stenosis at the origins of the great vessels.  Normal heart size.  No pericardial effusion. Mild dilation of the main PA, measuring 3.2 cm.   Mediastinum/Nodes: No enlarged mediastinal, hilar, or axillary lymph nodes. Thyroid gland, trachea, and esophagus demonstrate no significant findings.  Lungs/Pleura: No suspicious pulmonary nodule or mass. No pleural effusion or pneumothorax. Upper Abdomen: Hepatic steatosis. Large, exophytic LEFT renal cyst, measuring up to 12 cm. Incidental, variant splanchnic anatomy with absent celiac trunk and separate aortic origins of the common hepatic and splenic arteries. Musculoskeletal: Chronic LEFT rib deformity with nonunion. No acute chest wall abnormality. No acute osseous findings.   Review of the MIP images confirms the above findings.   IMPRESSION: 1. 4.0 cm ascending thoracic aorta. Continue annual imaging followup by CTA or MRA. This recommendation follows 2010 ACCF/AHA/AATS/ACR/ASA/SCA/SCAI/SIR/STS/SVM Guidelines for the Diagnosis and Management of Patients with Thoracic Aortic Disease. Circulation. 2010; 121: Z733-z630. Aortic aneurysm NOS (ICD10-I71.9) 2. Main pulmonary artery dilation, likely reflecting underlying pulmonary arterial hypertension. 3. Incidental, 12 cm LEFT renal cyst. 4.  Additional incidental, chronic and senescent findings as above.   Electronically Signed   By: Don [name] M.D.   On: 10/31/2021 11:19       Drug Allergies:  Allergies  Allergen Reactions   Lisinopril Itching    Muscle aches and fatigue.    Physical Exam: BP (!) 150/80   Pulse 93   Temp 97.6 F (36.4 C) (Temporal)   Resp 12   Wt (!) 345 lb 6.4 oz (156.7 kg)   SpO2 97%   BMI 52.52 kg/m    Physical Exam Vitals reviewed.  Constitutional:      Appearance: Normal appearance.  HENT:     Head: Normocephalic and atraumatic.     Right Ear: Tympanic membrane normal.      Left Ear: Tympanic membrane normal.     Nose:     Comments: Bilateral nares edematous with thin nasal drainage noted. Pharynx normal. Ears normal. Eyes normal.    Mouth/Throat:     Pharynx: Oropharynx is clear.  Eyes:     Conjunctiva/sclera: Conjunctivae normal.  Cardiovascular:     Rate and Rhythm: Normal rate and regular rhythm.     Heart sounds: Normal heart sounds. No murmur heard. Pulmonary:     Comments: Bilateral expiratory wheeze with partially cleared post bronchodilator therapy.  Musculoskeletal:        General: Normal range of motion.     Cervical back: Normal range of motion and neck supple.     Comments: 3+ bilateral pitting edema bilateral lower extremities  Skin:    General: Skin is warm and dry.  Neurological:     Mental Status: He is alert and oriented to person, place, and time.  Psychiatric:        Mood and Affect: Mood normal.        Behavior: Behavior normal.        Thought Content: Thought content normal.        Judgment: Judgment normal.    Diagnostics: Spirometry deferred due to recent sick exposures in the workplace   Assessment and Plan: 1. Severe persistent asthma with acute exacerbation   2. Other allergic rhinitis   3. Obstructive sleep apnea syndrome  4. Morbid obesity (HCC)     Meds ordered this encounter  Medications   levalbuterol  (XOPENEX  HFA) inhaler 4 puff   Fluticasone  Furoate (ARNUITY ELLIPTA ) 100 MCG/ACT AEPB    Sig: For asthma flare, begin Arnuity 100-1 puff once a day for 2 weeks or until cough and wheeze free    Dispense:  1 each    Refill:  1   predniSONE  (DELTASONE ) 10 MG tablet    Sig: .aapred    Dispense:  15 tablet    Refill:  0   famotidine  (PEPCID ) 20 MG tablet    Sig: Take 1 tablet (20 mg total) by mouth 2 (two) times daily.    Dispense:  60 tablet    Refill:  1    Patient Instructions   1.  Continue Tezepelumab  injection every 4 weeks   2.  Continue Trelegy 200 - 1 inhalation 1 time a day    3.  Continue  montelukast  10 mg - 1 tablet 1 time per day.     4.  Continue Flonase  - 2 sprays each nostril 1 time per day.    5.  If needed:   A. Albuterol  + ipratropium nebulization every 4-6 hours  B. Albuterol  HFA -2 inhalations every 4-6 hours  C. Cetirizine  10 mg - 1 tablet 1 time per day  D. Desonide  ointment applied 1-7 times per week to skin rash  6. For current asthma flare- A. Recommend chest xray B. Begin Arnuity 1 puff once a day for the next 2 weeks or until cough and wheeze free.  C. Begin prednisone  taper. Call the clinic if your symptoms worsen or if you develop a fever-we will move forward with chest xray  D. Mucinex 1200 mg twice a day  7. Begin famotidine  twice a day to control reflux. Evaluate in 1 month  8. Your COVID test was negative at today's visit  Call the clinic if this treatment plan is not working well for you  Follow up in 1 month or sooner if needed.   Return in about 4 weeks (around 11/01/2023), or if symptoms worsen or fail to improve.    Thank you for the opportunity to care for this patient.  Please do not hesitate to contact me with questions.  Arlean Mutter, FNP [facility] 

## 2023-10-04 NOTE — Patient Instructions (Addendum)
  1.  Continue Tezepelumab  injection every 4 weeks   2.  Continue Trelegy 200 - 1 inhalation 1 time a day    3.  Continue montelukast  10 mg - 1 tablet 1 time per day.     4.  Continue Flonase  - 2 sprays each nostril 1 time per day.    5.  If needed:   A. Albuterol  + ipratropium nebulization every 4-6 hours  B. Albuterol  HFA -2 inhalations every 4-6 hours  C. Cetirizine  10 mg - 1 tablet 1 time per day  D. Desonide  ointment applied 1-7 times per week to skin rash  6. For current asthma flare- A. Recommend chest xray B. Begin Arnuity 1 puff once a day for the next 2 weeks or until cough and wheeze free.  C. Begin prednisone  taper. Call the clinic if your symptoms worsen or if you develop a fever-we will move forward with chest xray  D. Mucinex 1200 mg twice a day  7. Begin famotidine  twice a day to control reflux. Evaluate in 1 month  8. Your COVID test was negative at today's visit  Call the clinic if this treatment plan is not working well for you  Follow up in 1 month or sooner if needed.

## 2023-10-04 NOTE — Telephone Encounter (Signed)
 Changed.

## 2023-10-05 ENCOUNTER — Telehealth: Payer: Self-pay

## 2023-10-05 NOTE — Telephone Encounter (Signed)
 Thank you :)

## 2023-10-05 NOTE — Telephone Encounter (Signed)
-----   Message from Thermon Leyland sent at 10/05/2023  7:29 AM EST ----- Can you please check on this patient's breathing? Thank you

## 2023-10-05 NOTE — Telephone Encounter (Signed)
 I called the patient and he stated he is having less cough and sneezing since taking the prednisone yesterday. Patient has no other questions or concerns at this time.

## 2023-10-14 ENCOUNTER — Ambulatory Visit: Payer: 59

## 2023-10-14 DIAGNOSIS — J4550 Severe persistent asthma, uncomplicated: Secondary | ICD-10-CM

## 2023-10-26 ENCOUNTER — Other Ambulatory Visit: Payer: Self-pay | Admitting: Family Medicine

## 2023-10-26 NOTE — Telephone Encounter (Signed)
Is it ok to switch to Tagemet or do you want the patient to purchase over the counter?

## 2023-11-11 ENCOUNTER — Other Ambulatory Visit: Payer: Self-pay | Admitting: Family Medicine

## 2023-11-11 NOTE — Telephone Encounter (Signed)
OTC famotidine please. Will re-evaluate reflux and asthma at follow up visit. Thank you.

## 2023-11-12 ENCOUNTER — Ambulatory Visit: Payer: 59 | Admitting: Family Medicine

## 2023-11-12 NOTE — Telephone Encounter (Signed)
Called and informed patient to purchase Famotidine over the counter per Anne's instruction. Patient verbalized understanding.

## 2023-11-19 ENCOUNTER — Other Ambulatory Visit: Payer: Self-pay

## 2023-11-19 ENCOUNTER — Ambulatory Visit: Payer: 59

## 2023-11-19 ENCOUNTER — Encounter: Payer: Self-pay | Admitting: Internal Medicine

## 2023-11-19 ENCOUNTER — Ambulatory Visit: Payer: 59 | Admitting: Internal Medicine

## 2023-11-19 VITALS — BP 140/100 | HR 86 | Temp 97.7°F | Resp 16

## 2023-11-19 DIAGNOSIS — J4550 Severe persistent asthma, uncomplicated: Secondary | ICD-10-CM

## 2023-11-19 DIAGNOSIS — J3089 Other allergic rhinitis: Secondary | ICD-10-CM

## 2023-11-19 DIAGNOSIS — I10 Essential (primary) hypertension: Secondary | ICD-10-CM | POA: Diagnosis not present

## 2023-11-19 MED ORDER — METOPROLOL SUCCINATE ER 50 MG PO TB24: 50.0000 mg | ORAL_TABLET | Freq: Every day | ORAL | 0 refills | Status: AC

## 2023-11-19 NOTE — Patient Instructions (Addendum)
  1.  Continue Tezepelumab injection every 4 weeks   2.  Continue Trelegy 200 - 1 inhalation 1 time a day    3.  Continue montelukast 10 mg - 1 tablet 1 time per day.     4.  Continue Flonase - 2 sprays each nostril 1 time per day.    5.  If needed:   A. Albuterol + ipratropium nebulization every 4-6 hours  B. Albuterol HFA -2 inhalations every 4-6 hours  C. Cetirizine 10 mg - 1 tablet 1 time per day  D. Desonide ointment applied 1-7 times per week to skin rash  6.  Recommend annual flu, COVID-vaccine, recommend RSV vaccine  7. Begin famotidine twice a day to control reflux. Evaluate in 1 month  Call the clinic if this treatment plan is not working well for you  Follow up: 6 months   Thank you so much for letting me partake in your care today.  Don't hesitate to reach out if you have any additional concerns!  Ferol Luz, MD  [facility] [place] [place]

## 2023-11-19 NOTE — Progress Notes (Signed)
 FOLLOW UP Date of Service/Encounter:  11/22/23  Subjective:  Don [name] (DOB: 10-28-1965) is a 58 y.o. male who [facility]23 in re-evaluation of the following: Rhinitis, severe persistent asthma on Tezspire,, GERD History obtained from: chart review and patient.  For Review, LV was on 10/04/23  with Thermon Leyland, FNP seen for routine follow-up. See below for summary of history and diagnostics.   Therapeutic plans/changes recommended: Treated for asthma flare with Arnuity 1 puff daily, prednisone, famotidine.  COVID test was negative. CXR ordered but not done  -----------------------------------------------------  Today presents for follow-up. Discussed the use of AI scribe software for clinical note transcription with the patient, who gave verbal consent to proceed.  History of Present Illness   Don [name] "Don [name]" is a 58 year old male with asthma who presents for a follow-up.  He was seen approximately five to six weeks ago for an asthma flare, during which prednisone was prescribed. He experienced improvement in his symptoms after using prednisone. However, he experiences shortness of breath when exposed to cold air, which lasts about five to ten minutes.  He has not been using his albuterol inhaler or nebulizer recently, although using the albuterol inhaler more than three times a day causes discomfort when taking deep breaths. He is currently continuing his regular medications, Trelegy and Tezspire,, as well as Flonase, montelukast, cetirizine and is due for his Tezspire injection today.  He only used prednisone and did not start the other medications, including Arnuity and famotidine, as he was unsure about them. HE denies any frank GERD symptoms.  He did not undergo a chest x-ray as previously discussed.  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 When he gets sick, he experiences  significant discomfort, including rib pain from coughing and sneezing, and difficulty sleeping, especially when lying down. Viral infections and cold weather are triggers for his asthma.  He has received his flu shot this year but has not received the COVID or RSV vaccines. His family doctor ran out of the RSV vaccine.         All medications reviewed by clinical staff and updated in chart. No new pertinent medical or surgical history except as noted in HPI.  ROS: All others negative except as noted per HPI.   Objective:  BP (!) 140/100 (BP Location: Right Arm, Patient Position: Sitting, Cuff Size: Large) Comment: Advised to pt to monitor BP at home and follow up with PCP. Provider is aware.  Pulse 86   Temp 97.7 F (36.5 C) (Temporal)   Resp 16   SpO2 97%  There is no height or weight on file to calculate BMI. Physical Exam: General Appearance:  Alert, cooperative, no distress, appears stated age  Head:  Normocephalic, without obvious abnormality, atraumatic  Eyes:  Conjunctiva clear, EOM's intact  Ears EACs normal bilaterally  Nose: Nares normal, normal mucosa, no visible anterior polyps, and septum midline  Throat: Lips, tongue normal; teeth and gums normal, normal posterior oropharynx  Neck: Supple, symmetrical  Lungs:   clear to auscultation bilaterally, Respirations unlabored, no coughing  Heart:  regular rate and rhythm and no murmur, Appears well perfused  Extremities: No edema  Skin: Skin color, texture, turgor normal and no rashes or lesions on visualized portions of skin  Neurologic: No gross deficits   Labs:  Lab Orders  No laboratory test(s) ordered today    Spirometry:  Tracings reviewed. His effort: Good  reproducible efforts. FVC: 4.20 L FEV1: 3.09 L, 91% predicted FEV1/FVC ratio: 74% Interpretation: Spirometry consistent with normal pattern.  Please see scanned spirometry results for details.    Assessment/Plan    1.  Continue Tezepelumab injection  every 4 weeks   2.  Continue Trelegy 200 - 1 inhalation 1 time a day    3.  Continue montelukast 10 mg - 1 tablet 1 time per day.     4.  Continue Flonase - 2 sprays each nostril 1 time per day.    5.  If needed:   A. Albuterol + ipratropium nebulization every 4-6 hours  B. Albuterol HFA -2 inhalations every 4-6 hours  C. Cetirizine 10 mg - 1 tablet 1 time per day  D. Desonide ointment applied 1-7 times per week to skin rash  6.  Recommend annual flu, COVID-vaccine, recommend RSV vaccine  7. Begin famotidine twice a day to control reflux. Evaluate in 1 month  Call the clinic if this treatment plan is not working well for you  Follow up: 6 months   Other: reviewed spirometry technique and reviewed inhaler technique  Thank you so much for letting me partake in your care today.  Don't hesitate to reach out if you have any additional concerns!  Ferol Luz, MD  [facility] [place] [place]

## 2023-11-26 ENCOUNTER — Other Ambulatory Visit: Payer: Self-pay | Admitting: Cardiology

## 2023-12-16 ENCOUNTER — Ambulatory Visit: Payer: 59

## 2023-12-16 DIAGNOSIS — J4550 Severe persistent asthma, uncomplicated: Secondary | ICD-10-CM | POA: Diagnosis not present

## 2023-12-17 ENCOUNTER — Ambulatory Visit: Payer: 59

## 2024-01-13 ENCOUNTER — Ambulatory Visit

## 2024-01-16 ENCOUNTER — Other Ambulatory Visit: Payer: Self-pay | Admitting: Allergy and Immunology

## 2024-01-20 ENCOUNTER — Ambulatory Visit

## 2024-01-20 DIAGNOSIS — J4550 Severe persistent asthma, uncomplicated: Secondary | ICD-10-CM

## 2024-02-17 ENCOUNTER — Ambulatory Visit (INDEPENDENT_AMBULATORY_CARE_PROVIDER_SITE_OTHER)

## 2024-02-17 DIAGNOSIS — J4550 Severe persistent asthma, uncomplicated: Secondary | ICD-10-CM | POA: Diagnosis not present

## 2024-02-29 LAB — AMB RESULTS CONSOLE CBG: Glucose: 108

## 2024-02-29 NOTE — Progress Notes (Unsigned)
 Pt attended 02/29/2024 screening event and BP was taken twice, 141/107 & 130/101. Pt's blood glucose was 108. Pt noted at the event that he does have a PCP and is not a smoker.   Pt was given med lifestyle packet and directed to [facility].

## 2024-03-16 ENCOUNTER — Ambulatory Visit

## 2024-03-16 DIAGNOSIS — J4550 Severe persistent asthma, uncomplicated: Secondary | ICD-10-CM | POA: Diagnosis not present

## 2024-04-13 ENCOUNTER — Ambulatory Visit

## 2024-04-14 ENCOUNTER — Ambulatory Visit

## 2024-04-14 DIAGNOSIS — J4550 Severe persistent asthma, uncomplicated: Secondary | ICD-10-CM | POA: Diagnosis not present

## 2024-05-12 ENCOUNTER — Other Ambulatory Visit: Payer: Self-pay

## 2024-05-12 ENCOUNTER — Ambulatory Visit: Admitting: Internal Medicine

## 2024-05-12 ENCOUNTER — Ambulatory Visit

## 2024-05-12 ENCOUNTER — Encounter: Payer: Self-pay | Admitting: Internal Medicine

## 2024-05-12 VITALS — BP 138/102 | HR 77 | Temp 98.3°F | Resp 14

## 2024-05-12 DIAGNOSIS — J310 Chronic rhinitis: Secondary | ICD-10-CM

## 2024-05-12 DIAGNOSIS — J4550 Severe persistent asthma, uncomplicated: Secondary | ICD-10-CM

## 2024-05-12 MED ORDER — TRELEGY ELLIPTA 200-62.5-25 MCG/ACT IN AEPB: 1.0000 | INHALATION_SPRAY | Freq: Every day | RESPIRATORY_TRACT | 1 refills | Status: DC

## 2024-05-12 MED ORDER — FLUTICASONE PROPIONATE 50 MCG/ACT NA SUSP: 2.0000 | Freq: Every day | NASAL | 1 refills | Status: AC

## 2024-05-12 MED ORDER — CETIRIZINE HCL 10 MG PO TABS: 10.0000 mg | ORAL_TABLET | Freq: Every day | ORAL | 1 refills | Status: AC | PRN

## 2024-05-12 MED ORDER — ALBUTEROL SULFATE (2.5 MG/3ML) 0.083% IN NEBU: 2.5000 mg | INHALATION_SOLUTION | Freq: Four times a day (QID) | RESPIRATORY_TRACT | 1 refills | Status: AC | PRN

## 2024-05-12 MED ORDER — ALBUTEROL SULFATE HFA 108 (90 BASE) MCG/ACT IN AERS: 1.0000 | INHALATION_SPRAY | Freq: Four times a day (QID) | RESPIRATORY_TRACT | 1 refills | Status: DC | PRN

## 2024-05-12 MED ORDER — MONTELUKAST SODIUM 10 MG PO TABS: 10.0000 mg | ORAL_TABLET | Freq: Every evening | ORAL | 1 refills | Status: DC

## 2024-05-12 NOTE — Patient Instructions (Addendum)
 1.  Use Tezepelumab  injection every 4 weeks   2.  Use Trelegy 200 - 1 inhalation 1 time a day    3.  Use Montelukast  10 mg - 1 tablet 1 time per day.     4.  Use Flonase  - 2 sprays each nostril 1 time per day.    5.  If needed:   A. Albuterol  1 vial nebulization every 4-6 hours  B. Albuterol  HFA -2 inhalations every 4-6 hours  C. Cetirizine  10 mg - 1 tablet 1 time per day  6.  Recommend annual flu, COVID-vaccine, recommend RSV vaccine

## 2024-05-12 NOTE — Progress Notes (Signed)
 FOLLOW UP Date of Service/Encounter:  05/12/24   Subjective:  Don [name] (DOB: [birth year]) is a 58 y.o. male who [facility] 05[facility]12/2024 for follow up for asthma and rhinitis.   History obtained from: chart review and patient. Last visit was with Dr Lorin and was doing well after a flare up requiring prednisone .  On Trelegy, Tezspire , Singulair .  Also on Flonase  and Zyrtec  for rhinitis.   Reports asthma has done okay. 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 Using Trelegy daily, Singulair  and Tezspire  very 4 weeks. No issues with Tezspire .  No ER visits/oral prednisone  since last visit.  Has done much better in terms of symptom control and flare ups with Tezspire .  Does note some congestion and drainage on and off.  Worse about a week ago, thinks he was sick.  Also notes some ear fullness. Not using nose spray but did take Zyrtec  and Singulair .   Past Medical History: Past Medical History:  Diagnosis Date   Acquired dilation of ascending aorta and aortic root (HCC)    38 mm for a sending and aortic root by echo 11/2020 and 38 mm aortic root at the sinuses of Valsalva and a sending aorta 40 mm by chest CTA 10/2021   Asthma    Claustrophobia    Depression    Hypertension    Kidney stone    YRS AGO   PASSED ON OWN    OSA (obstructive sleep apnea)    Followed by Dr. Jude   Renal artery stenosis [facility])    bilateral 1-59% by dopplers 10/2021    Objective:  BP (!) 138/102 (BP Location: Left Arm, Patient Position: Sitting, Cuff Size: Normal)   Pulse 77   Temp 98.3 F (36.8 C) (Temporal)   Resp 14   SpO2 95%  There is no height or weight on file to calculate BMI. Physical Exam: GEN: alert, well developed HEENT: clear conjunctiva, nose with mild inferior turbinate hypertrophy, pink nasal mucosa, no rhinorrhea, + cobblestoning HEART: regular rate and rhythm, no murmur LUNGS: clear to auscultation  bilaterally, no coughing, unlabored respiration SKIN: no rashes or lesions  Spirometry:  Tracings reviewed. His effort: Good reproducible efforts. FVC: 4.07L, 95% predicted  FEV1: 3.21L, 95% predicted FEV1/FVC ratio: 79% Interpretation: Spirometry consistent with normal pattern.  Please see scanned spirometry results for details.  Assessment:   1. Chronic rhinitis   2. Severe persistent asthma without complication     Plan/Recommendations:   Overall has done well on Tezspire  and Trelegy.   Did have a flare up requiring oral prednisone  in January but nothing since then.  Spirometry today was normal, MDI technique discussed. Will plan to continue on regimen at this time.  Does note some uncontrolled upper airway symptoms, discussed use of Flonase  daily and Zyrtec  to help control those better.   1.  Use Tezepelumab  injection every 4 weeks   2.  Use Trelegy 200 - 1 inhalation 1 time a day    3.  Use Montelukast  10 mg - 1 tablet 1 time per day.     4.  Use Flonase  - 2 sprays each nostril 1 time per day.    5.  If needed:   A. Albuterol  1 vial nebulization every 4-6 hours  B. Albuterol  HFA -2 inhalations every 4-6 hours  C. Cetirizine  10 mg - 1 tablet 1 time per day  6.  Recommend annual flu, COVID-vaccine, recommend RSV vaccine  Return in about 6 months (around 11/12/2024).  Arleta Blanch, MD [facility] 

## 2024-05-26 ENCOUNTER — Ambulatory Visit: Payer: 59 | Admitting: Internal Medicine

## 2024-06-05 NOTE — Progress Notes (Signed)
 Pt attended 02/29/2024 screening event with BP of 130/101 and blood sugar was 108. Pt noted at event that he does have a PCP. At event pt did not indicate any SDOH needs. Pt also noted that he is a former smoker and listed Other as his insurance at the event.   Per initial f/u pt was mailed letter curtesy with BP management flyer & [employer] Flyer.  Per chart review pt does have a PCP ([facility]), insurance, and is a former smoker. Pt's last appt with PCP was 03/01/2024. Pt does not indicate any SDOH needs at this time.  No additional pt f/u to be scheduled at this time per health equity protocol.

## 2024-06-09 ENCOUNTER — Ambulatory Visit

## 2024-06-19 ENCOUNTER — Other Ambulatory Visit: Payer: Self-pay | Admitting: Internal Medicine

## 2024-06-20 ENCOUNTER — Other Ambulatory Visit: Payer: Self-pay | Admitting: Allergy and Immunology

## 2024-07-26 ENCOUNTER — Other Ambulatory Visit: Payer: Self-pay | Admitting: Allergy and Immunology

## 2024-09-25 ENCOUNTER — Other Ambulatory Visit: Payer: Self-pay | Admitting: Internal Medicine

## 2024-11-17 ENCOUNTER — Ambulatory Visit: Admitting: Internal Medicine
